# Patient Record
Sex: Female | Born: 1980 | Race: Black or African American | Hispanic: No | Marital: Married | Smoking: Former smoker
Health system: Southern US, Community
[De-identification: ages and names within clinical notes are randomized; demographics above are authoritative.]

## PROBLEM LIST (undated history)

## (undated) DIAGNOSIS — R51 Headache: Secondary | ICD-10-CM

## (undated) DIAGNOSIS — K219 Gastro-esophageal reflux disease without esophagitis: Secondary | ICD-10-CM

## (undated) DIAGNOSIS — F4310 Post-traumatic stress disorder, unspecified: Secondary | ICD-10-CM

## (undated) DIAGNOSIS — F419 Anxiety disorder, unspecified: Secondary | ICD-10-CM

## (undated) DIAGNOSIS — F0781 Postconcussional syndrome: Secondary | ICD-10-CM

## (undated) DIAGNOSIS — J45909 Unspecified asthma, uncomplicated: Secondary | ICD-10-CM

## (undated) DIAGNOSIS — Z9109 Other allergy status, other than to drugs and biological substances: Secondary | ICD-10-CM

## (undated) DIAGNOSIS — Z794 Long term (current) use of insulin: Secondary | ICD-10-CM

## (undated) DIAGNOSIS — Z9889 Other specified postprocedural states: Secondary | ICD-10-CM

## (undated) DIAGNOSIS — J449 Chronic obstructive pulmonary disease, unspecified: Secondary | ICD-10-CM

## (undated) DIAGNOSIS — G8929 Other chronic pain: Secondary | ICD-10-CM

## (undated) DIAGNOSIS — R112 Nausea with vomiting, unspecified: Secondary | ICD-10-CM

## (undated) DIAGNOSIS — L732 Hidradenitis suppurativa: Secondary | ICD-10-CM

## (undated) DIAGNOSIS — IMO0001 Reserved for inherently not codable concepts without codable children: Secondary | ICD-10-CM

## (undated) DIAGNOSIS — H9190 Unspecified hearing loss, unspecified ear: Secondary | ICD-10-CM

## (undated) DIAGNOSIS — E119 Type 2 diabetes mellitus without complications: Secondary | ICD-10-CM

## (undated) DIAGNOSIS — G4730 Sleep apnea, unspecified: Secondary | ICD-10-CM

---

## 1999-12-11 HISTORY — PX: WISDOM TOOTH EXTRACTION: SHX21

## 2005-12-10 HISTORY — PX: ENDOMETRIAL ABLATION: SHX621

## 2006-12-10 HISTORY — PX: ABDOMINAL HYSTERECTOMY: SHX81

## 2011-04-10 HISTORY — PX: TOOTH EXTRACTION: SHX859

## 2011-07-28 ENCOUNTER — Emergency Department (HOSPITAL_COMMUNITY)
Admission: EM | Admit: 2011-07-28 | Discharge: 2011-07-28 | Disposition: A | Discharge status: Home / Self Care | Payer: 59 | Attending: Emergency Medicine | Admitting: Emergency Medicine

## 2011-07-28 ENCOUNTER — Emergency Department (HOSPITAL_COMMUNITY): Payer: 59

## 2011-07-28 DIAGNOSIS — R51 Headache: Secondary | ICD-10-CM | POA: Insufficient documentation

## 2011-07-28 DIAGNOSIS — IMO0001 Reserved for inherently not codable concepts without codable children: Secondary | ICD-10-CM | POA: Insufficient documentation

## 2011-07-28 DIAGNOSIS — B9789 Other viral agents as the cause of diseases classified elsewhere: Secondary | ICD-10-CM | POA: Insufficient documentation

## 2011-07-28 DIAGNOSIS — J45909 Unspecified asthma, uncomplicated: Secondary | ICD-10-CM | POA: Insufficient documentation

## 2011-07-28 DIAGNOSIS — R05 Cough: Secondary | ICD-10-CM | POA: Insufficient documentation

## 2011-07-28 DIAGNOSIS — R110 Nausea: Secondary | ICD-10-CM | POA: Insufficient documentation

## 2011-07-28 DIAGNOSIS — R059 Cough, unspecified: Secondary | ICD-10-CM | POA: Insufficient documentation

## 2012-05-16 ENCOUNTER — Emergency Department (HOSPITAL_COMMUNITY): Payer: Managed Care, Other (non HMO)

## 2012-05-16 ENCOUNTER — Encounter (HOSPITAL_COMMUNITY): Payer: Self-pay

## 2012-05-16 ENCOUNTER — Emergency Department (HOSPITAL_COMMUNITY)
Admission: EM | Admit: 2012-05-16 | Discharge: 2012-05-16 | Disposition: A | Discharge status: Home / Self Care | Payer: Managed Care, Other (non HMO) | Attending: Emergency Medicine | Admitting: Emergency Medicine

## 2012-05-16 DIAGNOSIS — J45909 Unspecified asthma, uncomplicated: Secondary | ICD-10-CM

## 2012-05-16 HISTORY — DX: Unspecified asthma, uncomplicated: J45.909

## 2012-05-16 LAB — COMPREHENSIVE METABOLIC PANEL
BUN: 10 mg/dL (ref 6–23)
CO2: 22 mEq/L (ref 19–32)
Calcium: 9.6 mg/dL (ref 8.4–10.5)
Creatinine, Ser: 0.83 mg/dL (ref 0.50–1.10)
GFR calc Af Amer: >90 mL/min (ref >90)
GFR calc non Af Amer: >90 mL/min (ref >90)
Glucose, Bld: 87 mg/dL (ref 70–99)
Total Protein: 8 g/dL (ref 6.0–8.3)

## 2012-05-16 LAB — DIFFERENTIAL
Eosinophils Absolute: 0.1 10*3/uL (ref 0.0–0.7)
Eosinophils Relative: 2 % (ref 0–5)
Lymphocytes Relative: 46 % (ref 12–46)
Lymphs Abs: 3.8 10*3/uL (ref 0.7–4.0)
Monocytes Absolute: 0.8 10*3/uL (ref 0.1–1.0)
Monocytes Relative: 9 % (ref 3–12)

## 2012-05-16 LAB — CBC
HCT: 39.7 % (ref 36.0–46.0)
Hemoglobin: 13.2 g/dL (ref 12.0–15.0)
MCH: 28.6 pg (ref 26.0–34.0)
MCV: 86.1 fL (ref 78.0–100.0)
Platelets: 241 10*3/uL (ref 150–400)
RBC: 4.61 MIL/uL (ref 3.87–5.11)

## 2012-05-16 LAB — POCT I-STAT TROPONIN I: Troponin i, poc: 0 ng/mL (ref 0.00–0.08)

## 2012-05-16 MED ORDER — ALBUTEROL SULFATE (5 MG/ML) 0.5% IN NEBU
2.5000 mg | INHALATION_SOLUTION | Freq: Once | RESPIRATORY_TRACT | Status: DC
  Filled 2012-05-16: qty 1

## 2012-05-16 MED ORDER — ALBUTEROL SULFATE (5 MG/ML) 0.5% IN NEBU: 2.5000 mg | INHALATION_SOLUTION | Freq: Once | RESPIRATORY_TRACT | Status: DC

## 2012-05-16 MED ORDER — ALBUTEROL SULFATE (2.5 MG/3ML) 0.083% IN NEBU: 2.5000 mg | INHALATION_SOLUTION | Freq: Four times a day (QID) | RESPIRATORY_TRACT | Status: DC | PRN

## 2012-05-16 MED ORDER — PREDNISONE 10 MG PO TABS: 20.0000 mg | ORAL_TABLET | Freq: Every day | ORAL | Status: DC

## 2012-05-16 MED ORDER — IPRATROPIUM BROMIDE 0.02 % IN SOLN: 1.0000 mg | Freq: Once | RESPIRATORY_TRACT | Status: DC

## 2012-05-16 MED ORDER — ALBUTEROL SULFATE (5 MG/ML) 0.5% IN NEBU
5.0000 mg | INHALATION_SOLUTION | Freq: Once | RESPIRATORY_TRACT | Status: AC
  Administered 2012-05-16: 5 mg via RESPIRATORY_TRACT

## 2012-05-16 MED ORDER — PREDNISONE 20 MG PO TABS
60.0000 mg | ORAL_TABLET | Freq: Once | ORAL | Status: AC
  Administered 2012-05-16: 60 mg via ORAL
  Filled 2012-05-16: qty 3

## 2012-05-16 MED ORDER — AZITHROMYCIN 250 MG PO TABS: ORAL_TABLET | ORAL | Status: AC

## 2012-05-16 MED ORDER — ALBUTEROL SULFATE (5 MG/ML) 0.5% IN NEBU: 10.0000 mg | INHALATION_SOLUTION | Freq: Once | RESPIRATORY_TRACT | Status: DC

## 2012-05-16 MED ORDER — IPRATROPIUM BROMIDE 0.02 % IN SOLN
0.5000 mg | Freq: Once | RESPIRATORY_TRACT | Status: AC
  Administered 2012-05-16: 0.5 mg via RESPIRATORY_TRACT
  Filled 2012-05-16: qty 2.5

## 2012-05-16 MED ORDER — AZITHROMYCIN 250 MG PO TABS
500.0000 mg | ORAL_TABLET | Freq: Once | ORAL | Status: AC
  Administered 2012-05-16: 500 mg via ORAL
  Filled 2012-05-16: qty 2

## 2012-05-16 NOTE — ED Notes (Signed)
Pt reports headache, dizziness, and  pain in the middle of her back x3 days, intermittent chest tightness and non-productive cough x3 months, dx w/bronchitis, pt w/audible congestion triage, O2 sats 100% ra

## 2012-05-16 NOTE — ED Provider Notes (Signed)
History     CSN: [id number]  Arrival date & time 05/16/12  1718   First MD Initiated Contact with Patient 05/16/12 2013      Chief Complaint  Patient presents with  . Shortness of Breath  . Chest Pain    HPI  History provided by the patient. Patient is a 31 year old female with history of asthma chronic bronchitis presents with worsening asthma bronchitis symptoms for the past 3 days. Patient states she's had similar symptoms for a long period of time. Patient reports having increased dry nonproductive cough with increased wheezing. Patient also complains of generalized fatigue, lightheadedness and headache. Patient has been using her daily Advair, Zyrtec and increased albuterol when necessary without improvement. Patient states that her primary care provider as scheduled for her specialty appointments with allergists because they believe this could be contributing to her symptoms. Patient denies having any fever, nausea or vomiting.    Past Medical History  Diagnosis Date  . Bronchitis   . Asthma     Past Surgical History  Procedure Date  . Abdominal hysterectomy     History reviewed. No pertinent family history.  History  Substance Use Topics  . Smoking status: Current Some Day Smoker  . Smokeless tobacco: Not on file  . Alcohol Use: Yes     occasional    OB History    Grav Para Term Preterm Abortions TAB SAB Ect Mult Living                  Review of Systems  Constitutional: Negative for fever and chills.  HENT: Positive for congestion and rhinorrhea.   Respiratory: Positive for cough, shortness of breath and wheezing.   Gastrointestinal: Negative for nausea and vomiting.  Neurological: Positive for light-headedness and headaches.    Allergies  Kiwi extract and Strawberry  Home Medications   Current Outpatient Rx  Name Route Sig Dispense Refill  . ACETAMINOPHEN 500 MG PO TABS Oral Take 500 mg by mouth every 6 (six) hours as needed. For pain    .  ALBUTEROL SULFATE HFA 108 (90 BASE) MCG/ACT IN AERS Inhalation Inhale 4 puffs into the lungs every 6 (six) hours as needed. For shortness of breath    . CETIRIZINE HCL 10 MG PO TABS Oral Take 10 mg by mouth daily.    Marland Kitchen FLUTICASONE-SALMETEROL 250-50 MCG/DOSE IN AEPB Inhalation Inhale 2 puffs into the lungs every 12 (twelve) hours.    . ADULT MULTIVITAMIN W/MINERALS CH Oral Take 1 tablet by mouth daily.      BP 125/80  Pulse 103  Temp(Src) 97.4 F (36.3 C) (Oral)  Resp 24  SpO2 100%  Physical Exam  Nursing note and vitals reviewed. Constitutional: She is oriented to person, place, and time. She appears well-developed and well-nourished. No distress.  HENT:  Head: Normocephalic.  Cardiovascular: Normal rate and regular rhythm.   Pulmonary/Chest: Effort normal. No respiratory distress. She has wheezes. She has no rales. She exhibits no tenderness.  Abdominal: Soft.       Obese  Neurological: She is alert and oriented to person, place, and time.  Skin: Skin is warm and dry. No rash noted.  Psychiatric: She has a normal mood and affect. Her behavior is normal.    ED Course  Procedures   Results for orders placed during the hospital encounter of 05/16/12  CBC      Component Value Range   WBC 8.4  4.0 - 10.5 (K/uL)   RBC 4.61  3.87 -  5.11 (MIL/uL)   Hemoglobin 13.2  12.0 - 15.0 (g/dL)   HCT 40.9  81.1 - 91.4 (%)   MCV 86.1  78.0 - 100.0 (fL)   MCH 28.6  26.0 - 34.0 (pg)   MCHC 33.2  30.0 - 36.0 (g/dL)   RDW 78.2  95.6 - 21.3 (%)   Platelets 241  150 - 400 (K/uL)  DIFFERENTIAL      Component Value Range   Neutrophils Relative 44  43 - 77 (%)   Neutro Abs 3.7  1.7 - 7.7 (K/uL)   Lymphocytes Relative 46  12 - 46 (%)   Lymphs Abs 3.8  0.7 - 4.0 (K/uL)   Monocytes Relative 9  3 - 12 (%)   Monocytes Absolute 0.8  0.1 - 1.0 (K/uL)   Eosinophils Relative 2  0 - 5 (%)   Eosinophils Absolute 0.1  0.0 - 0.7 (K/uL)   Basophils Relative 0  0 - 1 (%)   Basophils Absolute 0.0  0.0 - 0.1  (K/uL)  COMPREHENSIVE METABOLIC PANEL      Component Value Range   Sodium 139  135 - 145 (mEq/L)   Potassium 3.7  3.5 - 5.1 (mEq/L)   Chloride 103  96 - 112 (mEq/L)   CO2 22  19 - 32 (mEq/L)   Glucose, Bld 87  70 - 99 (mg/dL)   BUN 10  6 - 23 (mg/dL)   Creatinine, Ser 0.86  0.50 - 1.10 (mg/dL)   Calcium 9.6  8.4 - 57.8 (mg/dL)   Total Protein 8.0  6.0 - 8.3 (g/dL)   Albumin 3.8  3.5 - 5.2 (g/dL)   AST 15  0 - 37 (U/L)   ALT 23  0 - 35 (U/L)   Alkaline Phosphatase 80  39 - 117 (U/L)   Total Bilirubin 0.3  0.3 - 1.2 (mg/dL)   GFR calc non Af Amer >90  >90 (mL/min)   GFR calc Af Amer >90  >90 (mL/min)  POCT I-STAT TROPONIN I      Component Value Range   Troponin i, poc 0.00  0.00 - 0.08 (ng/mL)   Comment 3               Dg Chest 2 View  05/16/2012  *RADIOLOGY REPORT*  Clinical Data: Chest pain, wheezing  CHEST - 2 VIEW  Comparison: 07/28/2011  Findings: Cardiomediastinal silhouette is stable.  No acute infiltrate or pleural effusion.  No pulmonary edema.  Bony thorax is stable.  IMPRESSION: No active disease.  No significant change.  Original Report Authenticated By: Natasha Mead, M.D.     1. Asthma with bronchitis       MDM  8:10 PM patient seen and evaluated. Patient no acute distress.  Discussed with patient option for additional breathing treatments. This time she does not wish to have any more treatments. Patient has normal oxygen saturations and respirations.  To plan to place patient on a Z-Pak. We'll also provide prescription for steroid use next few days. Patient will continue to follow up with PCP.     Date: 05/16/2012  Rate: 101  Rhythm: sinus tachycardia  QRS Axis: normal  Intervals: normal  ST/T Wave abnormalities: nonspecific T wave changes  Conduction Disutrbances:none  Narrative Interpretation:   Old EKG Reviewed: none available    Angus Seller, [place] 05/17/12 [phone]

## 2012-05-16 NOTE — Discharge Instructions (Signed)
You were seen and evaluated for your complaints of cough and wheezing. You were given breathing treatments in the emergency room and medication to help with your symptoms. Your providers have written prescriptions for you to continue a low dose steroids for the next 5 days as well as albuterol to use in your home nebulizer. Please continue your medication as prescribed by your doctor and followup this week for continued evaluation and treatment. Return to the emergency room for any worsening symptoms, increase shortness of breath, fever, chills or sweats.    Chronic Asthmatic Bronchitis Chronic asthmatic bronchitis is often a complication of frequent asthma and/or bronchitis. After a long enough period of time, the continual airflow blockage is present in spite of treatment for asthma. The medications that used to treat asthma no longer work. The symptoms of chronic bronchitis may also be present. Bronchitis is an inflammation of the breathing tubules in the lungs. The combination of asthma, chronic bronchitis, and emphysema all affect the small breathing tubules (bronchial tree) in our lungs. It is a common condition. The problems from each are similar and overlap with each other so are sometimes hard to diagnose. When the asthma and bronchitis are combined, there is usually inflammation and infection. The small bronchial tubes produce more mucus. This blocks the airways and makes breathing harder. Usually this process is caused more by external irritants than infection. Smokers with chronic bronchitis are at a greater risk to develop asthmatic bronchitis. CAUSES   Why some people with asthma go on to develop chronic asthmatic bronchitis is not known. Smoking and environmental toxins or allergens seem to play a role. There are wide differences in who is susceptible.   Abnormalities of the small airways may develop in persons with persistent asthma. Asthmatics can be uncommonly subject to the effects of  smoking. Asthma is also found associated with a number of other diseases.  SYMPTOMS  Asthma, chronic bronchitis, and emphysema all cause symptoms of cough, wheezing, shortness of breath, and recurring infections. There may also be chest discomfort. All of the above symptoms happen more often in chronic asthmatic bronchitis. DIAGNOSIS   Asthma, chronic bronchitis, and emphysema all affect the entire bronchial tree. This makes it difficult on exam to tell them apart. Other tests of the lungs are done to prove a diagnosis. These are called pulmonary function tests.  TREATMENT   The asthmatic condition itself must always be treated.   Infection can be treated with antibiotics (medications to kill germs).   Serious infections may require hospitalization. These can include pneumonia, sinus infections, and acute bronchitis.  HOME CARE INSTRUCTIONS  Use prescription medications as ordered by your caregiver.   Avoid pollen, dust, animal dander, molds, smoke, and other things that cause attacks at home and at work.   You may have fewer attacks if you decrease dust in your home. Electrostatic air cleaners may help.   It may help to replace your pillows or mattress with materials less likely to cause allergies.   If you are not on fluid restriction, drink 8 to 10 glasses of water each day.   Discuss possible exercise routines with your caregiver.   If animal dander is the cause of asthma, you may need to get rid of pets.   It is important that you:   Become educated about your medical condition.   Participate in maintaining wellness.   Seek medical care promptly or immediately as indicated below.   Delay in seeking medical attention could cause permanent  injury and may be a risk to your life.  SEEK MEDICAL CARE IF  You have wheezing and shortness of breath even if taking medicine to prevent attacks.   An oral temperature above 102 F (38.9 C)   You have muscle aches, chest pain, or  thickening of sputum.   Your sputum changes from clear or white to yellow, green, gray, or bloody.   You have any problems that may be related to the medicine you are taking (such as a rash, itching, swelling, or trouble breathing).  SEEK IMMEDIATE MEDICAL CARE IF:  Your usual medicines do not stop your wheezing.   There is increased coughing and/or shortness of breath.   You have increased difficulty breathing.  MAKE SURE YOU:   Understand these instructions.   Will watch your condition.   Will get help right away if you are not doing well or get worse.  Document Released: 09/13/2006 Document Revised: 11/15/2011 Document Reviewed: 11/11/2007 [facility] Patient Information 2012 [place], [place].   RESOURCE GUIDE  Chronic Pain Problems: Contact [facility]  [phone] Patients need to be referred by their primary care doctor.  Insufficient Money for Medicine: Contact United Way:  call "211" or [facility] ([phone].  No Primary Care Doctor: - Call [facility]  [phone] - can help you locate a primary care doctor that  accepts your insurance, provides certain services, etc. - Physician Referral Service- [phone]  Agencies that provide inexpensive medical care: - [facility]  [phone] - [facility]  [phone] - [facility]  [phone] [facility]  [phone] - Planned Parenthood  [phone] [facility]  [phone]  Medicaid-accepting [facility] Providers: - [facility]- [address]  [phone], Mon-Fri 9am-7pm, Sat 9am-1pm - [facility]- [address] [place], [address] [place]  [phone] - [facility]- [address] [place]  [phone] [facility]- [address]  [phone] - Renaye Rakers- [address] [place], [address], [phone]  Only accepts [place] patients after  they have their name  applied to their card  Self Pay (no insurance) in [employer]: - Sickle Cell Patients: Dr Willey Blade, [facility]  [address] [place], [phone] - [facility]- [address] [place]  [phone]       [facility] [place]- [address]       -     [facility]- see information above (Speak to [employer] if you do not have insurance)       -  [facility]- [address] [place], [phone]       -  [facility]- [address],  [phone]       -  [facility]- [address], [phone]       -  Dr Julio Sicks-  [address], [address], [place], [phone]       -  [facility]- [address] [phone]       -  [facility]- [address], [phone], also [address] [phone]       -    [facility]- [address] [place], [phone], 1st & 3rd Saturday   every month, 10am-1pm  1)  Find a Doctor and Pay Out of Pocket Although you won't have to find out who is covered by your insurance plan, it is a good idea to ask around and get recommendations. You will then need to call the office and see if the doctor you have chosen will accept you as a new patient and what types of options they offer for patients who are self-pay. Some doctors offer discounts or will set up payment plans for their patients who do not have insurance, but you will need to ask so you aren't surprised when you get to your appointment.  2) Contact Your Local Health Department Not all health departments have doctors that can see patients for sick visits, but many do, so it is worth a call to see if yours does. If you don't know where your local health department is, you can check in your phone book. The CDC also has a tool to help you locate your state's health department, and many state websites also have listings of all of their local health departments.  3) Find a  Walk-in Clinic If your illness is not likely to be very severe or complicated, you may want to try a walk in clinic. These are popping up all over the country in pharmacies, drugstores, and shopping centers. They're usually staffed by nurse practitioners or physician assistants that have been trained to treat common illnesses and complaints. They're usually fairly quick and inexpensive. However, if you have serious medical issues or chronic medical problems, these are probably not your best option  STD Testing - [facility] Department of [facility], STD Clinic, [address], [place], phone [phone] or [phone].  Monday - Friday, call for an appointment. [facility] Department of [employer], STD Clinic, [address], [place], phone [phone] or [phone].  Monday - Friday, call for an appointment.  Abuse/Neglect: [facility] Child Abuse Hotline 534-290-6455 [facility] Child Abuse Hotline 531-839-5636 (After Hours)  Emergency Shelter:  [facility] (832) 309-7993  Maternity Homes: - [facility] [place] [facility] 367-678-9261 - [facility] (570)699-1360  MRSA Hotline #:   930-604-3419  [facility] Resources  [facility] [place]  United Way [facility] Dept. [address]                 [address]         [address] [place] [address]  Blondell Reveal Phone:  [phone]                                  Phone:  [phone]                   Phone:  [phone]  [facility], [phone] - [address] - [phone]       -     [facility]  in [place], [address],                                  [phone], Insurance  [place] Child Abuse Hotline (515)437-8866 or (610) 453-4622 (After Hours)   Behavioral Health Services  Substance Abuse Resources: - [facility]  (619) 239-8749 - [facility] 838-405-0516 - [facility] [phone] Floydene Flock 3435667583 - Residential & Outpatient Substance Abuse Program  [phone]  Psychological Services: [facility]  ([phone] [facility] Services  [phone] - [facility], [phone] [place]. [address] [place], ACCESS LINE: 801-306-6473 or (636) [phone], [employer]za  Dental Assistance  If unable to pay or uninsured, contact:  [facility] or [facility]. to become qualified for the adult dental clinic.  Patients with Medicaid: [facility] [phone] [address], ([phone], [phone]  If unable to pay, or uninsured, contact [facility] (303) [phone]) or [facility] Department [phone] in [place], [phone] in [facility]) to become qualified for the adult dental clinic  Other Low-Cost Community Dental Services: - [facility]- [address] [place], [place], [place], [address], [phone], Ext. 123, 2nd and 4th Thursday of the month at 6:30am.  10 clients each day by appointment, can sometimes see walk-in patients if someone does not show for an appointment. [facility]- [address] Ether Griffins [place], [place], [address], [phone] - [facility]- [address] [place], [place], [address], [phone] - [facility]- [phone] [facility] Health Department- [phone] [facility] Department- [phone]

## 2012-05-17 NOTE — ED Provider Notes (Signed)
Medical screening examination/treatment/procedure(s) were performed by non-physician practitioner and as supervising physician I was immediately available for consultation/collaboration.   Carleene Cooper III, MD 05/17/12 [phone]

## 2012-05-20 ENCOUNTER — Ambulatory Visit (HOSPITAL_BASED_OUTPATIENT_CLINIC_OR_DEPARTMENT_OTHER): Payer: Managed Care, Other (non HMO) | Attending: Internal Medicine | Admitting: Radiology

## 2012-05-20 VITALS — Ht 65.0 in | Wt 263.0 lb

## 2012-05-20 DIAGNOSIS — R0609 Other forms of dyspnea: Secondary | ICD-10-CM | POA: Insufficient documentation

## 2012-05-20 DIAGNOSIS — Z9989 Dependence on other enabling machines and devices: Secondary | ICD-10-CM

## 2012-05-20 DIAGNOSIS — R0989 Other specified symptoms and signs involving the circulatory and respiratory systems: Secondary | ICD-10-CM | POA: Insufficient documentation

## 2012-05-20 DIAGNOSIS — G4733 Obstructive sleep apnea (adult) (pediatric): Secondary | ICD-10-CM | POA: Insufficient documentation

## 2012-05-24 DIAGNOSIS — R0989 Other specified symptoms and signs involving the circulatory and respiratory systems: Secondary | ICD-10-CM

## 2012-05-24 DIAGNOSIS — R0609 Other forms of dyspnea: Secondary | ICD-10-CM

## 2012-05-24 DIAGNOSIS — G4733 Obstructive sleep apnea (adult) (pediatric): Secondary | ICD-10-CM

## 2012-05-24 NOTE — Procedures (Signed)
NAME:  Sharon Gregory, Sharon Gregory              ACCOUNT NO.:  [id number]  MEDICAL RECORD NO.:  [id number]          PATIENT TYPE:  OUT  LOCATION:  SLEEP CENTER                 FACILITY[facility]  [facility]  PHYSICIAN:  Bijou Easler D. Maple Hudson, MD, FCCP, FACPDATE OF BIRTH:  1979-09-13  DATE OF STUDY:  05/20/2012                           NOCTURNAL POLYSOMNOGRAM  REFERRING PHYSICIAN:  Fleet Contras, M.D.  INDICATION FOR STUDY:  Hypersomnia with sleep apnea.  EPWORTH SLEEPINESS SCORE:  5/24.  BMI 43.8, weight 263 pounds, height 65 inches, neck 16 inches.  MEDICATIONS:  Home medications are charted and reviewed.  SLEEP ARCHITECTURE:  Split-study protocol.  During the diagnostic phase, total sleep time 124.5 minutes with sleep efficiency 80.6%.  Stage I was 4%, stage II 36.9%, stage III 44.6%, REM 14.5% of total sleep time. Sleep latency 16 minutes, REM latency 98 minutes, awake after sleep onset 9 minutes.  Arousal index 9.6.  BEDTIME MEDICATION:  None.  RESPIRATORY DATA:  Apnea-hypopnea index (AHI) 14.9 per hour.  A total of 31 events was scored including 6 obstructive apneas, 25 hypopneas.  The events were not positional.  REM/AHI 73.3 per hour.  CPAP was titrated to 16 CWP, AHI 0 per hour.  She wore a medium ResMed Mirage Quattro full- face mask with heated humidifier and a C-flex of 3.  OXYGEN DATA:  Moderately loud snoring before CPAP with oxygen desaturation to a nadir of 83% on room air.  With CPAP titration, snoring was prevented and mean oxygen saturation held 97.1% on room air.  CARDIAC DATA:  Normal sinus rhythm.  MOVEMENT-PARASOMNIA:  No significant movement disturbance.  Bathroom x1.  IMPRESSIONS-RECOMMENDATIONS: 1. Mild obstructive sleep apnea/hypopnea syndrome, AHI 14.9 per hour     with non positional events.  Moderately loud snoring with oxygen     desaturation to a nadir of 83% on room air. 2. Successful CPAP titration to 16 CWP, AHI 0 per hour.  She wore a     medium ResMed Mirage Quattro  full-face     mask with heated humidifier and a C-flex setting of 3.  Snoring was     prevented and mean oxygen saturation held 97.1% on room air with     CPAP.     Micheala Morissette D. Maple Hudson, MD, [facility], FACP Diplomate, American Board of Sleep Medicine    CDY/MEDQ  D:  05/24/2012 10:34:08  T:  05/24/2012 19:05:11  Job:  [id number]

## 2012-12-10 HISTORY — PX: TOE SURGERY: SHX1073

## 2013-02-13 ENCOUNTER — Encounter (HOSPITAL_COMMUNITY): Payer: Self-pay

## 2013-02-13 ENCOUNTER — Emergency Department (INDEPENDENT_AMBULATORY_CARE_PROVIDER_SITE_OTHER)
Admission: EM | Admit: 2013-02-13 | Discharge: 2013-02-13 | Disposition: A | Discharge status: Home / Self Care | Payer: Managed Care, Other (non HMO) | Source: Home / Self Care | Attending: Emergency Medicine | Admitting: Emergency Medicine

## 2013-02-13 DIAGNOSIS — L304 Erythema intertrigo: Secondary | ICD-10-CM

## 2013-02-13 DIAGNOSIS — L538 Other specified erythematous conditions: Secondary | ICD-10-CM

## 2013-02-13 MED ORDER — NYSTATIN 100000 UNIT/GM EX POWD: Freq: Two times a day (BID) | CUTANEOUS | Status: DC

## 2013-02-13 MED ORDER — NYSTATIN-TRIAMCINOLONE 100000-0.1 UNIT/GM-% EX OINT: TOPICAL_OINTMENT | Freq: Two times a day (BID) | CUTANEOUS | Status: DC

## 2013-02-13 MED ORDER — CEPHALEXIN 500 MG PO CAPS: 500.0000 mg | ORAL_CAPSULE | Freq: Four times a day (QID) | ORAL | Status: DC

## 2013-02-13 NOTE — ED Notes (Signed)
Rash under bust

## 2013-02-13 NOTE — ED Provider Notes (Signed)
Medical screening examination/treatment/procedure(s) were performed by non-physician practitioner and as supervising physician I was immediately available for consultation/collaboration.  David Keller, M.D.  David C Keller, MD 02/13/13 2017 

## 2013-02-13 NOTE — ED Provider Notes (Signed)
History     CSN: [id number]  Arrival date & time 02/13/13  1552   First MD Initiated Contact with Patient 02/13/13 1658      Chief Complaint  Patient presents with  . Rash    (Consider location/radiation/quality/duration/timing/severity/associated sxs/prior treatment) Patient is a 32 y.o. female presenting with rash. The history is provided by the patient.  Rash Location:  Torso Torso rash location: under B breasts. Quality: itchiness   Quality comment:  Flat patches and raised bumps Severity:  Moderate Onset quality:  Gradual Duration:  1 week Timing:  Constant Progression:  Unchanged Chronicity:  New Context comment:  Denies any new exposures Relieved by:  Nothing Ineffective treatments:  Antihistamines and anti-fungal cream Associated symptoms: no fever     Past Medical History  Diagnosis Date  . Bronchitis   . Asthma     Past Surgical History  Procedure Laterality Date  . Abdominal hysterectomy      History reviewed. No pertinent family history.  History  Substance Use Topics  . Smoking status: Current Some Day Smoker  . Smokeless tobacco: Not on file  . Alcohol Use: Yes     Comment: occasional    OB History   Grav Para Term Preterm Abortions TAB SAB Ect Mult Living                  Review of Systems  Constitutional: Negative for fever and chills.  Skin: Positive for rash.    Allergies  Kiwi extract and Strawberry  Home Medications   Current Outpatient Rx  Name  Route  Sig  Dispense  Refill  . acetaminophen (TYLENOL) 500 MG tablet   Oral   Take 500 mg by mouth every 6 (six) hours as needed. For pain         . albuterol (PROVENTIL HFA;VENTOLIN HFA) 108 (90 BASE) MCG/ACT inhaler   Inhalation   Inhale 4 puffs into the lungs every 6 (six) hours as needed. For shortness of breath         . albuterol (PROVENTIL) (2.5 MG/3ML) 0.083% nebulizer solution   Nebulization   Take 3 mLs (2.5 mg total) by nebulization every 6 (six) hours as  needed for wheezing.   75 mL   12   . cephALEXin (KEFLEX) 500 MG capsule   Oral   Take 1 capsule (500 mg total) by mouth 4 (four) times daily.   28 capsule   0   . cetirizine (ZYRTEC) 10 MG tablet   Oral   Take 10 mg by mouth daily.         . Fluticasone-Salmeterol (ADVAIR) 250-50 MCG/DOSE AEPB   Inhalation   Inhale 2 puffs into the lungs every 12 (twelve) hours.         . Multiple Vitamin (MULTIVITAMIN WITH MINERALS) TABS   Oral   Take 1 tablet by mouth daily.         Marland Kitchen nystatin (MYCOSTATIN) powder   Topical   Apply topically 2 (two) times daily.   15 g   0   . nystatin-triamcinolone ointment (MYCOLOG)   Topical   Apply topically 2 (two) times daily.   30 g   0   . predniSONE (DELTASONE) 10 MG tablet   Oral   Take 2 tablets (20 mg total) by mouth daily.   10 tablet   0     BP 122/87  Pulse 109  Temp(Src) 98.9 F (37.2 C) (Oral)  Resp 21  SpO2 96%  Physical  Exam  Constitutional: She appears well-developed and well-nourished. No distress.  obese  Skin: Skin is warm, dry and intact. Rash noted. Rash is papular. No erythema.  Pt obese, breast pendulous.  Under B breasts and on chest between breasts is a rash.  Rash is patchy in places with shiny skin c/w candida and papular in places.  Excoriations from itching present.     ED Course  Procedures (including critical care time)  Labs Reviewed - No data to display No results found.   1. Intertrigo       MDM  Discussed with Dr. Lorenz Coaster. Papules may be related to secondary infection of candida rash.  Will tx with keflex and mycolog cream. Also rx nystatin powder to use under breasts when rash resolved.         Cathlyn Parsons, NP 02/13/13 1708

## 2013-04-06 ENCOUNTER — Emergency Department (HOSPITAL_COMMUNITY): Payer: Managed Care, Other (non HMO)

## 2013-04-06 ENCOUNTER — Encounter (HOSPITAL_COMMUNITY): Payer: Self-pay | Admitting: Adult Health

## 2013-04-06 ENCOUNTER — Emergency Department (HOSPITAL_COMMUNITY)
Admission: EM | Admit: 2013-04-06 | Discharge: 2013-04-06 | Disposition: A | Discharge status: Home / Self Care | Payer: Managed Care, Other (non HMO) | Attending: Emergency Medicine | Admitting: Emergency Medicine

## 2013-04-06 DIAGNOSIS — J3489 Other specified disorders of nose and nasal sinuses: Secondary | ICD-10-CM | POA: Insufficient documentation

## 2013-04-06 DIAGNOSIS — Z8709 Personal history of other diseases of the respiratory system: Secondary | ICD-10-CM | POA: Insufficient documentation

## 2013-04-06 DIAGNOSIS — R110 Nausea: Secondary | ICD-10-CM | POA: Insufficient documentation

## 2013-04-06 DIAGNOSIS — R221 Localized swelling, mass and lump, neck: Secondary | ICD-10-CM | POA: Insufficient documentation

## 2013-04-06 DIAGNOSIS — H53149 Visual discomfort, unspecified: Secondary | ICD-10-CM | POA: Insufficient documentation

## 2013-04-06 DIAGNOSIS — J45909 Unspecified asthma, uncomplicated: Secondary | ICD-10-CM | POA: Insufficient documentation

## 2013-04-06 DIAGNOSIS — H5710 Ocular pain, unspecified eye: Secondary | ICD-10-CM | POA: Insufficient documentation

## 2013-04-06 DIAGNOSIS — Z79899 Other long term (current) drug therapy: Secondary | ICD-10-CM | POA: Insufficient documentation

## 2013-04-06 DIAGNOSIS — R220 Localized swelling, mass and lump, head: Secondary | ICD-10-CM | POA: Insufficient documentation

## 2013-04-06 DIAGNOSIS — H5711 Ocular pain, right eye: Secondary | ICD-10-CM

## 2013-04-06 DIAGNOSIS — F17200 Nicotine dependence, unspecified, uncomplicated: Secondary | ICD-10-CM | POA: Insufficient documentation

## 2013-04-06 LAB — CBC WITH DIFFERENTIAL/PLATELET
Basophils Relative: 0 % (ref 0–1)
Eosinophils Absolute: 0.2 10*3/uL (ref 0.0–0.7)
Hemoglobin: 11.8 g/dL — ABNORMAL LOW (ref 12.0–15.0)
MCH: 28 pg (ref 26.0–34.0)
MCHC: 33.4 g/dL (ref 30.0–36.0)
Monocytes Absolute: 0.7 10*3/uL (ref 0.1–1.0)
Monocytes Relative: 10 % (ref 3–12)
Neutrophils Relative %: 38 % — ABNORMAL LOW (ref 43–77)

## 2013-04-06 LAB — POCT I-STAT, CHEM 8
Calcium, Ion: 1.23 mmol/L (ref 1.12–1.23)
Glucose, Bld: 153 mg/dL — ABNORMAL HIGH (ref 70–99)
HCT: 36 % (ref 36.0–46.0)
Hemoglobin: 12.2 g/dL (ref 12.0–15.0)
Potassium: 3.8 mEq/L (ref 3.5–5.1)
TCO2: 28 mmol/L (ref 0–100)

## 2013-04-06 MED ORDER — ONDANSETRON 4 MG PO TBDP
8.0000 mg | ORAL_TABLET | Freq: Once | ORAL | Status: AC
  Administered 2013-04-06: 8 mg via ORAL
  Filled 2013-04-06: qty 2

## 2013-04-06 MED ORDER — IOHEXOL 300 MG/ML  SOLN
80.0000 mL | Freq: Once | INTRAMUSCULAR | Status: AC | PRN
  Administered 2013-04-06: 80 mL via INTRAVENOUS

## 2013-04-06 MED ORDER — SODIUM CHLORIDE 0.9 % IV SOLN
Freq: Once | INTRAVENOUS | Status: AC
  Administered 2013-04-06: 06:00:00 via INTRAVENOUS

## 2013-04-06 MED ORDER — TETRACAINE HCL 0.5 % OP SOLN
2.0000 [drp] | Freq: Once | OPHTHALMIC | Status: AC
  Administered 2013-04-06: 2 [drp] via OPHTHALMIC
  Filled 2013-04-06: qty 2

## 2013-04-06 MED ORDER — FLUORESCEIN SODIUM 1 MG OP STRP
ORAL_STRIP | OPHTHALMIC | Status: AC
  Administered 2013-04-06: 06:00:00
  Filled 2013-04-06: qty 1

## 2013-04-06 NOTE — ED Provider Notes (Signed)
History     CSN: [id number]  Arrival date & time 04/06/13  0029   First MD Initiated Contact with Patient 04/06/13 0421      Chief Complaint  Patient presents with  . Eye Pain    (Consider location/radiation/quality/duration/timing/severity/associated sxs/prior treatment) HPI Comments: Patient presents tonight with 3, weeks of right eye pain, nausea.  She states last week she had some facial swelling, and pain with movement of the eye.  This has been better.  She also states, that she's noticed some change in the vision of her right eye.  She does wear glasses.  Her last eye exam was August.  No prescription changes made.   She has had regular screening for glaucoma as she has a family history and no mention was made of increased pressures  Patient is a 32 y.o. female presenting with eye pain. The history is provided by the patient.  Eye Pain This is a new problem. The current episode started 1 to 4 weeks ago. The problem occurs constantly. The problem has been gradually worsening. Associated symptoms include congestion, nausea and a visual change. Pertinent negatives include no fever or neck pain. The symptoms are aggravated by exertion. She has tried acetaminophen for the symptoms. The treatment provided no relief.    Past Medical History  Diagnosis Date  . Bronchitis   . Asthma     Past Surgical History  Procedure Laterality Date  . Abdominal hysterectomy      History reviewed. No pertinent family history.  History  Substance Use Topics  . Smoking status: Current Some Day Smoker  . Smokeless tobacco: Not on file  . Alcohol Use: Yes     Comment: occasional    OB History   Grav Para Term Preterm Abortions TAB SAB Ect Mult Living                  Review of Systems  Constitutional: Negative for fever.  HENT: Positive for congestion. Negative for neck pain.   Eyes: Positive for photophobia, pain and visual disturbance. Negative for discharge and redness.   Gastrointestinal: Positive for nausea.  All other systems reviewed and are negative.    Allergies  Kiwi extract and Strawberry  Home Medications   Current Outpatient Rx  Name  Route  Sig  Dispense  Refill  . albuterol (PROVENTIL HFA;VENTOLIN HFA) 108 (90 BASE) MCG/ACT inhaler   Inhalation   Inhale 4 puffs into the lungs every 6 (six) hours as needed. For shortness of breath         . Beclomethasone Dipropionate (QNASL) 80 MCG/ACT AERS   Nasal   Place 1 spray into the nose daily.         . cetirizine (ZYRTEC) 10 MG tablet   Oral   Take 10 mg by mouth daily.         Marland Kitchen ibuprofen (ADVIL,MOTRIN) 800 MG tablet   Oral   Take 800 mg by mouth every 8 (eight) hours as needed for pain.         . Multiple Vitamin (MULTIVITAMIN WITH MINERALS) TABS   Oral   Take 1 tablet by mouth daily.         Marland Kitchen nystatin (MYCOSTATIN) powder   Topical   Apply topically 2 (two) times daily.   15 g   0   . nystatin-triamcinolone ointment (MYCOLOG)   Topical   Apply topically 2 (two) times daily.   30 g   0   . Olopatadine HCl (  PATANASE) 0.6 % SOLN   Nasal   Place 1 puff into the nose 2 (two) times daily.         Marland Kitchen omeprazole (PRILOSEC) 40 MG capsule   Oral   Take 40 mg by mouth every other day.           BP 154/103  Pulse 90  Temp(Src) 97.7 F (36.5 C) (Oral)  Resp 18  SpO2 98%  Physical Exam  Nursing note and vitals reviewed. Constitutional: She appears well-developed and well-nourished.  HENT:  Head: Normocephalic.  Eyes: Right eye exhibits no discharge. Left eye exhibits no discharge. Right conjunctiva is not injected. Right conjunctiva has a hemorrhage. Left conjunctiva is not injected. Left conjunctiva has no hemorrhage. Right eye exhibits normal extraocular motion and no nystagmus. Left eye exhibits no nystagmus. Pupils are equal.  Fundoscopic exam:      The right eye shows no exudate, no hemorrhage and no papilledema.  Slit lamp exam:      The right eye  shows no fluorescein uptake.  Right eye pressure 8    ED Course  Procedures (including critical care time)  Labs Reviewed  CBC WITH DIFFERENTIAL - Abnormal; Notable for the following:    Hemoglobin 11.8 (*)    HCT 35.3 (*)    Neutrophils Relative 38 (*)    Lymphocytes Relative 49 (*)    All other components within normal limits   No results found.   No diagnosis found.    MDM           Arman Filter, NP 04/06/13 [phone]

## 2013-04-06 NOTE — ED Provider Notes (Signed)
Pt received from Earley Favor PA-C at shift change.  Pt complaining of right eye pain x 3 weeks, worse with touching or moving the eye.  Denise any blurred vision, flashes, or floaters.  Pt has regular eye exams- wears glasses and has no personal hx of glaucoma. Right eye pressure normal at 8.  Plan:  CT orbit- d/c when appropriate.  Results for orders placed during the hospital encounter of 04/06/13  CBC WITH DIFFERENTIAL      Result Value Range   WBC 7.2  4.0 - 10.5 K/uL   RBC 4.22  3.87 - 5.11 MIL/uL   Hemoglobin 11.8 (*) 12.0 - 15.0 g/dL   HCT 45.4 (*) 09.8 - 11.9 %   MCV 83.6  78.0 - 100.0 fL   MCH 28.0  26.0 - 34.0 pg   MCHC 33.4  30.0 - 36.0 g/dL   RDW 14.7  82.9 - 56.2 %   Platelets 250  150 - 400 K/uL   Neutrophils Relative 38 (*) 43 - 77 %   Neutro Abs 2.7  1.7 - 7.7 K/uL   Lymphocytes Relative 49 (*) 12 - 46 %   Lymphs Abs 3.5  0.7 - 4.0 K/uL   Monocytes Relative 10  3 - 12 %   Monocytes Absolute 0.7  0.1 - 1.0 K/uL   Eosinophils Relative 3  0 - 5 %   Eosinophils Absolute 0.2  0.0 - 0.7 K/uL   Basophils Relative 0  0 - 1 %   Basophils Absolute 0.0  0.0 - 0.1 K/uL   Ct Orbits W/cm  04/06/2013  *RADIOLOGY REPORT*  Clinical Data: Right eye pain beginning 3 weeks ago.  Nausea and right facial swelling.  CT ORBITS WITH CONTRAST  Technique:  Multidetector CT imaging of the orbits was performed following the bolus administration of intravenous contrast.  Contrast: 80mL OMNIPAQUE IOHEXOL 300 MG/ML  SOLN  Comparison: None.  Findings: The globes and extraocular muscles appear intact and symmetrical.  No evidence of significant periorbital or retrobulbar infiltration.  No abnormal contrast enhancement.  There is some opacification and mucous membrane thickening in the ethmoid air cells and frontal sinuses.  No acute air-fluid levels are demonstrated in the paranasal sinuses.  The orbital and facial bones appear intact.  IMPRESSION: No acute process demonstrated in the right orbit.   Consider MRI for additional evaluation if symptoms persist.   Original Report Authenticated By: Burman Nieves, M.D.     CT orbit negative.  Visual acuity screening uncorrected at L 20/30, R 20/100, bilat 20/40- pt did not bring her glasses with her.  Unsure if this visual acuity differs from baseline.  Some relief of pain with tetracaine.  Pt will FU with her eye doctor for further evaluation and possible MRI.  Discussed with Dr. Rulon Abide who agrees with plan.  Return precautions advised.  Garlon Hatchet, PA-C 04/07/13 1220  Garlon Hatchet, PA-C 04/07/13 1221

## 2013-04-06 NOTE — ED Notes (Signed)
Presents with right eye pain that began 3 weeks associated with nausea and right facial swelling that has gone down. Opening eye and touch makes pain worse. Pt denies blurred vision. She states, "this is not a headache or migraine. This is my actual eyeball and the pain is making me nauseated"  Pain is 10/10 described as throbbing. Denies flashing lights and floaters.

## 2013-04-07 NOTE — ED Provider Notes (Signed)
Medical screening examination/treatment/procedure(s) were performed by non-physician practitioner and as supervising physician I was immediately available for consultation/collaboration.  Jones Skene, M.D.     Jones Skene, MD 04/07/13 3244

## 2013-04-10 NOTE — ED Provider Notes (Signed)
Medical screening examination/treatment/procedure(s) were performed by non-physician practitioner and as supervising physician I was immediately available for consultation/collaboration.  John-Adam Raneen Jaffer, M.D.  John-Adam Travone Georg, MD 04/10/13 0825 

## 2013-05-19 ENCOUNTER — Encounter (HOSPITAL_COMMUNITY): Payer: Self-pay | Admitting: Adult Health

## 2013-05-19 ENCOUNTER — Emergency Department (HOSPITAL_COMMUNITY): Payer: Managed Care, Other (non HMO)

## 2013-05-19 ENCOUNTER — Emergency Department (HOSPITAL_COMMUNITY)
Admission: EM | Admit: 2013-05-19 | Discharge: 2013-05-19 | Disposition: A | Discharge status: Home / Self Care | Payer: Managed Care, Other (non HMO) | Attending: Emergency Medicine | Admitting: Emergency Medicine

## 2013-05-19 DIAGNOSIS — Y939 Activity, unspecified: Secondary | ICD-10-CM | POA: Insufficient documentation

## 2013-05-19 DIAGNOSIS — M79674 Pain in right toe(s): Secondary | ICD-10-CM

## 2013-05-19 DIAGNOSIS — Z79899 Other long term (current) drug therapy: Secondary | ICD-10-CM | POA: Insufficient documentation

## 2013-05-19 DIAGNOSIS — J45909 Unspecified asthma, uncomplicated: Secondary | ICD-10-CM | POA: Insufficient documentation

## 2013-05-19 DIAGNOSIS — F17200 Nicotine dependence, unspecified, uncomplicated: Secondary | ICD-10-CM | POA: Insufficient documentation

## 2013-05-19 DIAGNOSIS — Z8709 Personal history of other diseases of the respiratory system: Secondary | ICD-10-CM | POA: Insufficient documentation

## 2013-05-19 DIAGNOSIS — S8990XA Unspecified injury of unspecified lower leg, initial encounter: Secondary | ICD-10-CM | POA: Insufficient documentation

## 2013-05-19 DIAGNOSIS — Y929 Unspecified place or not applicable: Secondary | ICD-10-CM | POA: Insufficient documentation

## 2013-05-19 DIAGNOSIS — X500XXA Overexertion from strenuous movement or load, initial encounter: Secondary | ICD-10-CM | POA: Insufficient documentation

## 2013-05-19 MED ORDER — IBUPROFEN 200 MG PO TABS
400.0000 mg | ORAL_TABLET | Freq: Once | ORAL | Status: AC
  Administered 2013-05-19: 400 mg via ORAL
  Filled 2013-05-19: qty 2

## 2013-05-19 MED ORDER — IBUPROFEN 800 MG PO TABS: 800.0000 mg | ORAL_TABLET | Freq: Three times a day (TID) | ORAL | Status: DC

## 2013-05-19 MED ORDER — OXYCODONE-ACETAMINOPHEN 5-325 MG PO TABS
1.0000 | ORAL_TABLET | Freq: Once | ORAL | Status: AC
  Administered 2013-05-19: 1 via ORAL
  Filled 2013-05-19: qty 1

## 2013-05-19 MED ORDER — ONDANSETRON 4 MG PO TBDP
8.0000 mg | ORAL_TABLET | Freq: Once | ORAL | Status: AC
  Administered 2013-05-19: 8 mg via ORAL
  Filled 2013-05-19: qty 2

## 2013-05-19 NOTE — ED Notes (Signed)
Presents with right great toe and 2nd toe injury. Pt states "It bent backwards against the steps. I can not move my toe" nail bleeding and swelling noted.

## 2013-05-19 NOTE — ED Provider Notes (Signed)
Medical screening examination/treatment/procedure(s) were performed by non-physician practitioner and as supervising physician I was immediately available for consultation/collaboration.   Gwyneth Sprout, MD 05/19/13 [phone]

## 2013-05-19 NOTE — ED Provider Notes (Signed)
History     CSN: [id number]  Arrival date & time 05/19/13  2025   First MD Initiated Contact with Patient 05/19/13 2200      Chief Complaint  Patient presents with  . Toe Injury    (Consider location/radiation/quality/duration/timing/severity/associated sxs/prior treatment) HPI Comments: Patient presents with chief complaint of toe pain. She states that she stubbed her toe and bent it backwards against the steps. She reports increasing pain in her toe since the accident. She also endorses some swelling. She states that the pain keeps her from wanting to put pressure on her toes. She has not tried anything to alleviate her symptoms.  The history is provided by the patient. No language interpreter was used.    Past Medical History  Diagnosis Date  . Bronchitis   . Asthma     Past Surgical History  Procedure Laterality Date  . Abdominal hysterectomy      History reviewed. No pertinent family history.  History  Substance Use Topics  . Smoking status: Current Some Day Smoker  . Smokeless tobacco: Not on file  . Alcohol Use: Yes     Comment: occasional    OB History   Grav Para Term Preterm Abortions TAB SAB Ect Mult Living                  Review of Systems  All other systems reviewed and are negative.    Allergies  Kiwi extract and Strawberry  Home Medications   Current Outpatient Rx  Name  Route  Sig  Dispense  Refill  . albuterol (PROVENTIL HFA;VENTOLIN HFA) 108 (90 BASE) MCG/ACT inhaler   Inhalation   Inhale 4 puffs into the lungs every 6 (six) hours as needed. For shortness of breath         . Beclomethasone Dipropionate (QNASL) 80 MCG/ACT AERS   Nasal   Place 1 spray into the nose daily.         . cetirizine (ZYRTEC) 10 MG tablet   Oral   Take 10 mg by mouth daily.         Marland Kitchen esomeprazole (NEXIUM) 40 MG capsule   Oral   Take 40 mg by mouth every other day.         . ibuprofen (ADVIL,MOTRIN) 800 MG tablet   Oral   Take 800 mg by  mouth every 8 (eight) hours as needed for pain.         . Multiple Vitamin (MULTIVITAMIN WITH MINERALS) TABS   Oral   Take 1 tablet by mouth daily.         . Olopatadine HCl (PATANASE) 0.6 % SOLN   Nasal   Place 1 puff into the nose 2 (two) times daily.         Marland Kitchen omeprazole (PRILOSEC) 40 MG capsule   Oral   Take 40 mg by mouth every other day.         . ibuprofen (ADVIL,MOTRIN) 800 MG tablet   Oral   Take 1 tablet (800 mg total) by mouth 3 (three) times daily.   21 tablet   0     BP 129/86  Pulse 96  Temp(Src) 98.5 F (36.9 C) (Oral)  Resp 14  SpO2 98%  Physical Exam  Nursing note and vitals reviewed. Constitutional: She is oriented to person, place, and time. She appears well-developed and well-nourished.  HENT:  Head: Normocephalic and atraumatic.  Eyes: Conjunctivae and EOM are normal.  Neck: Normal range of motion.  Cardiovascular: Normal rate and intact distal pulses.   Brisk cap refill  Pulmonary/Chest: Effort normal.  Abdominal: She exhibits no distension.  Musculoskeletal: Normal range of motion.  Right great toe tender to palpation, range of motion and strength deferred secondary to pain, moderate swelling  Neurological: She is alert and oriented to person, place, and time.  Skin: Skin is dry.  Right great toenail with mild avulsion, however it is not completely of avulsed, mild bleeding which is controlled.  Psychiatric: She has a normal mood and affect. Her behavior is normal. Judgment and thought content normal.    ED Course  Procedures (including critical care time)  Labs Reviewed - No data to display Dg Foot Complete Right  05/19/2013   *RADIOLOGY REPORT*  Clinical Data: Blunt trauma, striking the right foot on stairs today.  Swelling of the toes.  Bleeding and the nail of the great toe.  Limited range of motion.  RIGHT FOOT COMPLETE - 3+ VIEW  Comparison: None.  Findings: Dorsal and plantar soft tissue swelling is demonstrated in the  forefoot.  Underlying bones appear intact.  No evidence of acute fracture or subluxation.  No focal bone lesion or bone destruction.  Bone cortex and trabecular architecture appear intact.  No radiopaque soft tissue foreign bodies.  Plantar spur on the calcaneus.  IMPRESSION: Soft tissue swelling.  No acute bony abnormalities identified.   Original Report Authenticated By: Burman Nieves, M.D.     1. Toe pain, right       MDM  Patient with right great toe nail avulsion, and toe pain. Patient stubbed her toe earlier today. Will treat with ibuprofen, and give postop shoe, and a crutch. Patient is stable and ready for discharge. No fractures.        Roxy Horseman, PA-C 05/19/13 2238

## 2013-06-28 ENCOUNTER — Encounter (HOSPITAL_COMMUNITY): Payer: Self-pay | Admitting: Emergency Medicine

## 2013-06-28 ENCOUNTER — Inpatient Hospital Stay (HOSPITAL_COMMUNITY)
Admission: EM | Admit: 2013-06-28 | Discharge: 2013-06-30 | DRG: 638 | Disposition: A | Discharge status: Home / Self Care | Payer: Managed Care, Other (non HMO) | Attending: Internal Medicine | Admitting: Internal Medicine

## 2013-06-28 DIAGNOSIS — J4489 Other specified chronic obstructive pulmonary disease: Secondary | ICD-10-CM | POA: Diagnosis present

## 2013-06-28 DIAGNOSIS — E669 Obesity, unspecified: Secondary | ICD-10-CM | POA: Diagnosis present

## 2013-06-28 DIAGNOSIS — E119 Type 2 diabetes mellitus without complications: Secondary | ICD-10-CM

## 2013-06-28 DIAGNOSIS — Z6841 Body Mass Index (BMI) 40.0 and over, adult: Secondary | ICD-10-CM

## 2013-06-28 DIAGNOSIS — Z87891 Personal history of nicotine dependence: Secondary | ICD-10-CM

## 2013-06-28 DIAGNOSIS — E860 Dehydration: Secondary | ICD-10-CM | POA: Diagnosis present

## 2013-06-28 DIAGNOSIS — J449 Chronic obstructive pulmonary disease, unspecified: Secondary | ICD-10-CM

## 2013-06-28 DIAGNOSIS — R39859 Costovertebral (angle) tenderness, unspecified side: Secondary | ICD-10-CM

## 2013-06-28 DIAGNOSIS — R739 Hyperglycemia, unspecified: Secondary | ICD-10-CM

## 2013-06-28 DIAGNOSIS — E1110 Type 2 diabetes mellitus with ketoacidosis without coma: Secondary | ICD-10-CM

## 2013-06-28 DIAGNOSIS — M549 Dorsalgia, unspecified: Secondary | ICD-10-CM

## 2013-06-28 DIAGNOSIS — E1010 Type 1 diabetes mellitus with ketoacidosis without coma: Principal | ICD-10-CM | POA: Diagnosis present

## 2013-06-28 DIAGNOSIS — Z79899 Other long term (current) drug therapy: Secondary | ICD-10-CM

## 2013-06-28 HISTORY — DX: Gastro-esophageal reflux disease without esophagitis: K21.9

## 2013-06-28 HISTORY — DX: Chronic obstructive pulmonary disease, unspecified: J44.9

## 2013-06-28 HISTORY — DX: Headache: R51

## 2013-06-28 HISTORY — DX: Sleep apnea, unspecified: G47.30

## 2013-06-28 LAB — URINALYSIS, ROUTINE W REFLEX MICROSCOPIC
Bilirubin Urine: NEGATIVE
Glucose, UA: >1000 mg/dL — AB
Ketones, ur: >80 mg/dL — AB
Leukocytes, UA: NEGATIVE
Specific Gravity, Urine: 1.01 (ref 1.005–1.030)
pH: 5.5 (ref 5.0–8.0)

## 2013-06-28 LAB — CBC
MCHC: 34.1 g/dL (ref 30.0–36.0)
Platelets: 237 10*3/uL (ref 150–400)
RDW: 12.9 % (ref 11.5–15.5)

## 2013-06-28 LAB — GLUCOSE, CAPILLARY

## 2013-06-28 LAB — POCT PREGNANCY, URINE: Preg Test, Ur: NEGATIVE

## 2013-06-28 LAB — URINE MICROSCOPIC-ADD ON

## 2013-06-28 MED ORDER — SODIUM CHLORIDE 0.9 % IV SOLN
1000.0000 mL | Freq: Once | INTRAVENOUS | Status: AC
  Administered 2013-06-29: 1000 mL via INTRAVENOUS

## 2013-06-28 MED ORDER — SODIUM CHLORIDE 0.9 % IV SOLN: 1000.0000 mL | INTRAVENOUS | Status: DC

## 2013-06-28 NOTE — ED Notes (Signed)
CBG Exceeds Measure. Notified Nurse Clydie Braun.

## 2013-06-28 NOTE — ED Notes (Signed)
Reports CBG >600 at 7pm tonight.  C/o nausea, increased thirst, increased urination, and feeling shaky all over.  Denies pain.

## 2013-06-29 ENCOUNTER — Encounter (HOSPITAL_COMMUNITY): Payer: Self-pay | Admitting: Internal Medicine

## 2013-06-29 DIAGNOSIS — J449 Chronic obstructive pulmonary disease, unspecified: Secondary | ICD-10-CM

## 2013-06-29 DIAGNOSIS — R7309 Other abnormal glucose: Secondary | ICD-10-CM

## 2013-06-29 DIAGNOSIS — M549 Dorsalgia, unspecified: Secondary | ICD-10-CM

## 2013-06-29 DIAGNOSIS — E1110 Type 2 diabetes mellitus with ketoacidosis without coma: Secondary | ICD-10-CM

## 2013-06-29 LAB — BASIC METABOLIC PANEL
BUN: 11 mg/dL (ref 6–23)
CO2: 19 mEq/L (ref 19–32)
Calcium: 8.5 mg/dL (ref 8.4–10.5)
Calcium: 8.4 mg/dL (ref 8.4–10.5)
Creatinine, Ser: 0.57 mg/dL (ref 0.50–1.10)
Creatinine, Ser: 0.53 mg/dL (ref 0.50–1.10)
GFR calc Af Amer: >90 mL/min (ref >90)
GFR calc non Af Amer: >90 mL/min (ref >90)
Glucose, Bld: 221 mg/dL — ABNORMAL HIGH (ref 70–99)
Sodium: 137 mEq/L (ref 135–145)

## 2013-06-29 LAB — GLUCOSE, CAPILLARY
Glucose-Capillary: 522 mg/dL — ABNORMAL HIGH (ref 70–99)
Glucose-Capillary: 426 mg/dL — ABNORMAL HIGH (ref 70–99)
Glucose-Capillary: 309 mg/dL — ABNORMAL HIGH (ref 70–99)
Glucose-Capillary: 229 mg/dL — ABNORMAL HIGH (ref 70–99)
Glucose-Capillary: 152 mg/dL — ABNORMAL HIGH (ref 70–99)
Glucose-Capillary: 153 mg/dL — ABNORMAL HIGH (ref 70–99)
Glucose-Capillary: 303 mg/dL — ABNORMAL HIGH (ref 70–99)
Glucose-Capillary: 291 mg/dL — ABNORMAL HIGH (ref 70–99)

## 2013-06-29 LAB — POCT I-STAT 3, VENOUS BLOOD GAS (G3P V)
Acid-base deficit: 9 mmol/L — ABNORMAL HIGH (ref 0.0–2.0)
Bicarbonate: 17.2 mEq/L — ABNORMAL LOW (ref 20.0–24.0)
O2 Saturation: 54 %

## 2013-06-29 LAB — COMPREHENSIVE METABOLIC PANEL
ALT: 13 U/L (ref 0–35)
Albumin: 3.6 g/dL (ref 3.5–5.2)
Alkaline Phosphatase: 97 U/L (ref 39–117)
Potassium: 4.5 mEq/L (ref 3.5–5.1)
Sodium: 127 mEq/L — ABNORMAL LOW (ref 135–145)
Total Protein: 7.9 g/dL (ref 6.0–8.3)

## 2013-06-29 MED ORDER — BECLOMETHASONE DIPROPIONATE 80 MCG/ACT NA AERS: 1.0000 | INHALATION_SPRAY | Freq: Every day | NASAL | Status: DC

## 2013-06-29 MED ORDER — FLUTICASONE PROPIONATE 50 MCG/ACT NA SUSP
1.0000 | Freq: Every day | NASAL | Status: DC
  Filled 2013-06-29: qty 16

## 2013-06-29 MED ORDER — SODIUM CHLORIDE 0.9 % IV SOLN
INTRAVENOUS | Status: AC
  Administered 2013-06-29: 1000 mL via INTRAVENOUS

## 2013-06-29 MED ORDER — SODIUM CHLORIDE 0.9 % IV SOLN
INTRAVENOUS | Status: DC
  Administered 2013-06-29: 4.8 [IU]/h via INTRAVENOUS
  Filled 2013-06-29: qty 1

## 2013-06-29 MED ORDER — SODIUM CHLORIDE 0.9 % IV SOLN
INTRAVENOUS | Status: DC
  Administered 2013-06-29: 1000 mL via INTRAVENOUS

## 2013-06-29 MED ORDER — POTASSIUM CHLORIDE 10 MEQ/100ML IV SOLN
10.0000 meq | INTRAVENOUS | Status: DC
  Filled 2013-06-29: qty 100

## 2013-06-29 MED ORDER — DEXTROSE-NACL 5-0.45 % IV SOLN
INTRAVENOUS | Status: DC
  Administered 2013-06-29: 1000 mL via INTRAVENOUS

## 2013-06-29 MED ORDER — DEXTROSE 50 % IV SOLN: 25.0000 mL | INTRAVENOUS | Status: DC | PRN

## 2013-06-29 MED ORDER — ONDANSETRON HCL 4 MG/2ML IJ SOLN
4.0000 mg | Freq: Once | INTRAMUSCULAR | Status: AC
  Administered 2013-06-29: 4 mg via INTRAVENOUS
  Filled 2013-06-29: qty 2

## 2013-06-29 MED ORDER — LIVING WELL WITH DIABETES BOOK
Freq: Once | Status: AC
  Administered 2013-06-29: 12:00:00
  Filled 2013-06-29: qty 1

## 2013-06-29 MED ORDER — INSULIN PEN STARTER KIT
1.0000 | Freq: Once | Status: AC
  Administered 2013-06-29: 1
  Filled 2013-06-29: qty 1

## 2013-06-29 MED ORDER — ONDANSETRON HCL 4 MG/2ML IJ SOLN
4.0000 mg | Freq: Four times a day (QID) | INTRAMUSCULAR | Status: DC | PRN
  Administered 2013-06-29 (×2): 4 mg via INTRAVENOUS
  Filled 2013-06-29 (×2): qty 2

## 2013-06-29 MED ORDER — SODIUM CHLORIDE 0.9 % IV SOLN
INTRAVENOUS | Status: DC
  Administered 2013-06-29: 22:00:00 via INTRAVENOUS

## 2013-06-29 MED ORDER — POTASSIUM CHLORIDE 10 MEQ/100ML IV SOLN
10.0000 meq | INTRAVENOUS | Status: AC
  Administered 2013-06-29 (×2): 10 meq via INTRAVENOUS
  Filled 2013-06-29: qty 200

## 2013-06-29 MED ORDER — INSULIN ASPART 100 UNIT/ML ~~LOC~~ SOLN
0.0000 [IU] | Freq: Three times a day (TID) | SUBCUTANEOUS | Status: DC
  Administered 2013-06-29: 5 [IU] via SUBCUTANEOUS
  Administered 2013-06-29 – 2013-06-30 (×2): 11 [IU] via SUBCUTANEOUS
  Administered 2013-06-30: 15 [IU] via SUBCUTANEOUS

## 2013-06-29 MED ORDER — PANTOPRAZOLE SODIUM 40 MG PO TBEC
40.0000 mg | DELAYED_RELEASE_TABLET | Freq: Every day | ORAL | Status: DC
  Administered 2013-06-29 – 2013-06-30 (×2): 40 mg via ORAL
  Filled 2013-06-29 (×2): qty 1

## 2013-06-29 MED ORDER — MOMETASONE FURO-FORMOTEROL FUM 100-5 MCG/ACT IN AERO
2.0000 | INHALATION_SPRAY | Freq: Two times a day (BID) | RESPIRATORY_TRACT | Status: DC
  Administered 2013-06-29 – 2013-06-30 (×3): 2 via RESPIRATORY_TRACT
  Filled 2013-06-29: qty 8.8

## 2013-06-29 MED ORDER — POTASSIUM CHLORIDE CRYS ER 20 MEQ PO TBCR
40.0000 meq | EXTENDED_RELEASE_TABLET | Freq: Once | ORAL | Status: AC
  Administered 2013-06-29: 40 meq via ORAL
  Filled 2013-06-29: qty 2

## 2013-06-29 MED ORDER — ENOXAPARIN SODIUM 40 MG/0.4ML ~~LOC~~ SOLN
40.0000 mg | SUBCUTANEOUS | Status: DC
  Administered 2013-06-29 – 2013-06-30 (×2): 40 mg via SUBCUTANEOUS
  Filled 2013-06-29 (×2): qty 0.4

## 2013-06-29 MED ORDER — INSULIN REGULAR HUMAN 100 UNIT/ML IJ SOLN
INTRAMUSCULAR | Status: DC
  Administered 2013-06-29: 4.6 [IU]/h via INTRAVENOUS
  Filled 2013-06-29: qty 1

## 2013-06-29 MED ORDER — INSULIN GLARGINE 100 UNIT/ML ~~LOC~~ SOLN
20.0000 [IU] | Freq: Every day | SUBCUTANEOUS | Status: DC
  Administered 2013-06-29 – 2013-06-30 (×2): 20 [IU] via SUBCUTANEOUS
  Filled 2013-06-29 (×2): qty 0.2

## 2013-06-29 MED ORDER — ALBUTEROL SULFATE HFA 108 (90 BASE) MCG/ACT IN AERS
4.0000 | INHALATION_SPRAY | Freq: Four times a day (QID) | RESPIRATORY_TRACT | Status: DC | PRN
  Administered 2013-06-29: 4 via RESPIRATORY_TRACT
  Filled 2013-06-29: qty 6.7

## 2013-06-29 NOTE — Progress Notes (Signed)
Encouraged  patient to watch Diabetes video.

## 2013-06-29 NOTE — ED Notes (Signed)
I-stat Blood Gas Venous drawn by phlebotomy

## 2013-06-29 NOTE — Progress Notes (Addendum)
Inpatient Diabetes Program Recommendations  AACE/ADA: New Consensus Statement on Inpatient Glycemic Control (2013)  Target Ranges:  Prepandial:   less than 140 mg/dL      Peak postprandial:   less than 180 mg/dL (1-2 hours)      Critically ill patients:  140 - 180 mg/dL     Admitted with DKA.  Newly diagnosed with DM about 2 weeks ago at her PCP's office (Dr. Fleet Contras).  Spoke with pt about new diagnosis.  Explained what an A1C is, basic pathophysiology of DM Type 2, basic home care, importance of checking CBGs and maintaining good CBG control to prevent long-term and short-term complications.  Reviewed signs and symptoms of hyperglycemia and hypoglycemia.  RNs to provide ongoing basic DM education at bedside with this patient.  Have ordered educational booklet, insulin  Pen starter kit, and DM videos. Also ordered RD consult for DM diet education and placed OP DM education referral to the [facility] DM Management center after d/c.  Educated patient on insulin pen use at home.  Reviewed contents of insulin flexpen starter kit.  Reviewed all steps if insulin pen including attachment of needle, 2-unit air shot, dialing up dose, giving injection, removing needle, disposal of sharps, storage of unused insulin, disposal of insulin etc.  Patient able to provide successful return demonstration.  Also reviewed troubleshooting with insulin pen.  MD to give patient Rxs for insulin pens and insulin pen needles.  MD-Patient would prefer insulin pens at discharge if it is necessary to send her home on insulin.  Have showed patient how to use insulin pens and RNs to provide reinforcement at bedside.    Please provide Rxs for  1. Insulin pens: (Lantus Solostar and/or Novolog Flexpen) 2. Insulin pen needles (pen needles- 31 gauge-60mm)  Also: Please consider checking A1c while patient here in hospital- MD, Patient would like to know if she needs an antibody test to check to see if she has  Type 1 or Type 2 diabetes.  Thanks!  Will follow. Ambrose Finland RN, MSN, CDE Diabetes Coordinator Inpatient Diabetes Program [phone]

## 2013-06-29 NOTE — Plan of Care (Signed)
Problem: Phase I Progression Outcomes Goal: Diabetes Coordinator Consult Outcome: Completed/Met Date Met:  06/29/13 Ordered by MD.

## 2013-06-29 NOTE — Progress Notes (Signed)
Pt seen and examined Admitted earlier this am with DKA Anion gap closed, Start lantus, SSI Change IVF to NS, DM education Insulin teaching R flank pain, improved, UA benign Replete K  Zannie Cove, MD [phone]

## 2013-06-29 NOTE — ED Notes (Signed)
Admitting MD at bedside.

## 2013-06-29 NOTE — ED Provider Notes (Signed)
History    CSN: [id number] Arrival date & time 06/28/13  2248  First MD Initiated Contact with Patient 06/28/13 2334     Chief Complaint  Patient presents with  . Hyperglycemia   (Consider location/radiation/quality/duration/timing/severity/associated sxs/prior Treatment) HPI 32 yo female presents to the ER from home with complaint of high blood sugar.  Her meter at home was reading greater than 600.  Pt has had increased thirst, nausea, increased urination, fatigue, shakiness.  Pt was dx with dm 2 weeks ago, placed on Jentadueto 2.5/500 by her PCM, Dr Concepcion Elk.  She denies any vomiting.  No fevers, chills. She has been trying to stick to diabetic diet.    Past Medical History  Diagnosis Date  . Bronchitis   . Asthma   . Diabetes mellitus without complication   . COPD (chronic obstructive pulmonary disease)    Past Surgical History  Procedure Laterality Date  . Abdominal hysterectomy    . Cesarean section     No family history on file. History  Substance Use Topics  . Smoking status: Former Smoker    Types: Cigarettes    Quit date: 06/03/2013  . Smokeless tobacco: Not on file  . Alcohol Use: Yes     Comment: occasional   OB History   Grav Para Term Preterm Abortions TAB SAB Ect Mult Living                 Review of Systems  All other systems reviewed and are negative.    Allergies  Kiwi extract and Strawberry  Home Medications   Current Outpatient Rx  Name  Route  Sig  Dispense  Refill  . albuterol (PROVENTIL HFA;VENTOLIN HFA) 108 (90 BASE) MCG/ACT inhaler   Inhalation   Inhale 4 puffs into the lungs every 6 (six) hours as needed. For shortness of breath         . Beclomethasone Dipropionate (QNASL) 80 MCG/ACT AERS   Nasal   Place 1 spray into the nose daily.         Marland Kitchen esomeprazole (NEXIUM) 40 MG capsule   Oral   Take 40 mg by mouth every other day.         . ibuprofen (ADVIL,MOTRIN) 800 MG tablet   Oral   Take 800 mg by mouth every 8 (eight)  hours as needed for pain.         . mometasone-formoterol (DULERA) 100-5 MCG/ACT AERO   Inhalation   Inhale 2 puffs into the lungs 2 (two) times daily.         . Multiple Vitamin (MULTIVITAMIN WITH MINERALS) TABS   Oral   Take 1 tablet by mouth daily.         . Olopatadine HCl (PATADAY) 0.2 % SOLN   Ophthalmic   Apply 1 drop to eye daily as needed (red/itchy eyes).         . Olopatadine HCl (PATANASE) 0.6 % SOLN   Nasal   Place 1 drop into the nose daily as needed (for allergy symptoms).         . sitaGLIPtan-metformin (JANUMET) 50-500 MG per tablet   Oral   Take 1 tablet by mouth 2 (two) times daily with a meal.          BP 118/77  Pulse 91  Temp(Src) 98.2 F (36.8 C) (Oral)  Resp 18  SpO2 99% Physical Exam  Nursing note and vitals reviewed. Constitutional: She is oriented to person, place, and time. She appears well-developed  and well-nourished.  Obese, fatigued appearing, uncomfortable, anxious  HENT:  Head: Normocephalic and atraumatic.  Nose: Nose normal.  Mouth/Throat: Oropharynx is clear and moist.  Eyes: Conjunctivae and EOM are normal. Pupils are equal, round, and reactive to light.  Neck: Normal range of motion. Neck supple. No JVD present. No tracheal deviation present. No thyromegaly present.  Cardiovascular: Normal rate, regular rhythm, normal heart sounds and intact distal pulses.  Exam reveals no gallop and no friction rub.   No murmur heard. Pulmonary/Chest: Effort normal and breath sounds normal. No stridor. No respiratory distress. She has no wheezes. She has no rales. She exhibits no tenderness.  Abdominal: Soft. Bowel sounds are normal. She exhibits no distension and no mass. There is no tenderness. There is no rebound and no guarding.  Musculoskeletal: Normal range of motion. She exhibits no edema and no tenderness.  Lymphadenopathy:    She has no cervical adenopathy.  Neurological: She is alert and oriented to person, place, and time. No  cranial nerve deficit. She exhibits normal muscle tone. Coordination normal.  Skin: Skin is warm and dry. No rash noted. No erythema. No pallor.  Psychiatric: She has a normal mood and affect. Her behavior is normal. Judgment and thought content normal.    ED Course  Procedures (including critical care time) Labs Reviewed  COMPREHENSIVE METABOLIC PANEL - Abnormal; Notable for the following:    Sodium 127 (*)    Chloride 92 (*)    CO2 17 (*)    Glucose, Bld 750 (*)    All other components within normal limits  URINALYSIS, ROUTINE W REFLEX MICROSCOPIC - Abnormal; Notable for the following:    Color, Urine STRAW (*)    Glucose, UA >1000 (*)    Ketones, ur >80 (*)    All other components within normal limits  GLUCOSE, CAPILLARY - Abnormal; Notable for the following:    Glucose-Capillary >600 (*)    All other components within normal limits  KETONES, QUALITATIVE - Abnormal; Notable for the following:    Acetone, Bld SMALL (*)    All other components within normal limits  POCT I-STAT 3, BLOOD GAS (G3P V) - Abnormal; Notable for the following:    pCO2, Ven 39.1 (*)    Bicarbonate 17.2 (*)    Acid-base deficit 9.0 (*)    All other components within normal limits  CBC  URINE MICROSCOPIC-ADD ON  POCT PREGNANCY, URINE   No results found. 1. Hyperglycemia   2. Diabetes mellitus     MDM  32 yo female with hyperglycemia, anion gap of 17, no acidosis.  Will d/w hospitalist for admission, diabetic teaching.  Olivia Mackie, MD 06/29/13 [phone]

## 2013-06-29 NOTE — Progress Notes (Signed)
Nutrition Brief Note  Patient identified on the Malnutrition Screening Tool (MST) Report.  Per readings below, patient has had a 1.5% weight loss in greater than 1 year; not significant for time frame.  Wt Readings from Last 10 Encounters:  06/29/13 259 lb 11.2 oz (117.8 kg)  05/20/12 263 lb (119.296 kg)    Body mass index is 43.22 kg/(m^2). Patient meets criteria for Obesity Class III based on current BMI.   Current diet order is Carbohydrate Modified Medium Calorie.  Labs and medications reviewed.   No nutrition interventions warranted at this time. If nutrition issues arise, please consult RD.   Maureen Chatters, RD, LDN Pager #: [phone] After-Hours Pager #: [phone]

## 2013-06-29 NOTE — H&P (Signed)
[facility] History and Physical  Sharon Gregory  YQM:[id number]  DOB: 1981-09-01  DOA: 06/28/2013  Referring physician: Dr Norlene Campbell PCP: No primary provider on file.   Chief Complaint: nausea and polyurea  HPI: Sharon Gregory is a 32 y.o. female with Past medical history of recently diagnosed DM, COPD present today with c/o feeling dehydrated and nausea and polyurea with elevated blood glucose. The pt has been having polyurea since last few weeks and has been seen by PCP who diagnosed her with DM and started her on Linagliptin and metformin. She continues to have polyurea and since last 4 days has been feeling weak and tired. Since last 2 days she has been having nausea and that got worse today. When she checked her sugar today it was above 600 and was reccommended to go to the ER. She has been feeling dizzy and lightheaded as well. She does have some left sided flank pain but dose not have any blood in her urine or fever or chills ir urinary symptoms.  Pt denies any fever, chills, headache, nausea, vomiting, abdominal pain, diarrhea, constipation, active bleeding, chest pain, palpitation, pedal edema, orthopnea, PND, focal neurological deficit, burning urination, cough, shortness of breath.   Review of Systems: as mentioned in the history of present illness.  A Comprehensive review of the other systems is negative.  Past Medical History  Diagnosis Date  . Bronchitis   . Asthma   . Diabetes mellitus without complication   . COPD (chronic obstructive pulmonary disease)    Past Surgical History  Procedure Laterality Date  . Abdominal hysterectomy    . Cesarean section     Social History:  reports that she quit smoking about 3 weeks ago. Her smoking use included Cigarettes. She smoked 0.00 packs per day. She does not have any smokeless tobacco history on file. She reports that  drinks alcohol. She reports that she does not use illicit drugs. Patient is coming from  home. Patient can participate in ADLs.  Allergies  Allergen Reactions  . Kiwi Extract     swelling  . Strawberry     hives    No family history on file.  Prior to Admission medications   Medication Sig Start Date End Date Taking? Authorizing Provider  albuterol (PROVENTIL HFA;VENTOLIN HFA) 108 (90 BASE) MCG/ACT inhaler Inhale 4 puffs into the lungs every 6 (six) hours as needed. For shortness of breath   Yes Historical Provider, MD  Beclomethasone Dipropionate (QNASL) 80 MCG/ACT AERS Place 1 spray into the nose daily.   Yes Historical Provider, MD  esomeprazole (NEXIUM) 40 MG capsule Take 40 mg by mouth every other day.   Yes Historical Provider, MD  ibuprofen (ADVIL,MOTRIN) 800 MG tablet Take 800 mg by mouth every 8 (eight) hours as needed for pain.   Yes Historical Provider, MD  LINAGLIPTIN-METFORMIN HCL PO Take by mouth.   Yes Historical Provider, MD  mometasone-formoterol (DULERA) 100-5 MCG/ACT AERO Inhale 2 puffs into the lungs 2 (two) times daily.   Yes Historical Provider, MD  Multiple Vitamin (MULTIVITAMIN WITH MINERALS) TABS Take 1 tablet by mouth daily.   Yes Historical Provider, MD  Olopatadine HCl (PATADAY) 0.2 % SOLN Apply 1 drop to eye daily as needed (red/itchy eyes).   Yes Historical Provider, MD  Olopatadine HCl (PATANASE) 0.6 % SOLN Place 1 drop into the nose daily as needed (for allergy symptoms).   Yes Historical Provider, MD    Physical Exam: Filed Vitals:   06/29/13 0100 06/29/13  0130 06/29/13 0200 06/29/13 0215  BP: 117/81 121/80 112/67 102/57  Pulse: 89 85 89 82  Temp:      TempSrc:      Resp: 31 19 21 22   SpO2: 99% 100% 99% 99%    General: Alert, Awake and Oriented to Time, Place and Person. Appear in severe distress Eyes: PERRL ENT: Oral Mucosa clear dry. Neck: no JVD, no Carotid Bruits, no Stiffness Cardiovascular: S1 and S2 Present, Murmur no, Peripheral Pulses Present Respiratory: Clear to Auscultation, Bilateral Air entry equal and  Decreased, Abdomen: Bowel Sound Present, Soft and Non tender, left CVAT present, no Organomegaly Extremities: Pedal edema and calf tenderness absent. Neurologic: Mental status, Motor strength, Sensation, reflexes, Proprioception Grossly Unremarkable.  Labs on Admission:  Basic Metabolic Panel:  Recent Labs Lab 06/28/13 2305  NA 127*  K 4.5  CL 92*  CO2 17*  GLUCOSE 750*  BUN 11  CREATININE 0.77  CALCIUM 9.3   Liver Function Tests:  Recent Labs Lab 06/28/13 2305  AST 9  ALT 13  ALKPHOS 97  BILITOT 0.3  PROT 7.9  ALBUMIN 3.6   No results found for this basename: LIPASE, AMYLASE,  in the last 168 hours No results found for this basename: AMMONIA,  in the last 168 hours CBC:  Recent Labs Lab 06/28/13 2305  WBC 6.1  HGB 13.3  HCT 39.0  MCV 84.1  PLT 237   Cardiac Enzymes: No results found for this basename: CKTOTAL, CKMB, CKMBINDEX, TROPONINI,  in the last 168 hours  BNP (last 3 results) No results found for this basename: PROBNP,  in the last 8760 hours CBG:  Recent Labs Lab 06/28/13 2255 06/29/13 0142  GLUCAP >600* 522*    Radiological Exams on Admission: No results found.  Assessment/Plan Principal Problem:   Diabetic keto-acidosis Active Problems:   left CVA tenderness   COPD (chronic obstructive pulmonary disease)   1. DKA Pt has aniog gap of 18 and elevated blood glucose, blood acetone levels. She does not have any source of infection and likely cause of DKA is ongoing worsening of DM. At present she has received IV insulin bolus and has been started on Insulin drip as well She has been given IV fluids and has on going IV infusion K is less then 5 and we will replace it. NPO until the anion gap is closed. Close observation in step down unit.  2. Left CVAT Unclear etiology. Will get urine culture and US renal to look for cause.  3. Diabetes Will ned insulin treatment and education.  4. COPD Continue home inhalers.  DVT  Prophylaxis: Lovenox 40 mg Q24 hrs Nutrition: NPO  Code Status: full  Family Communication: husband was at bedside.   Author: Lynden Oxford, MD [facility] Pager: [phone] 06/29/2013 2:30 AM    If 7PM-7AM, please contact night-coverage [email or web address] Password [email or web address]

## 2013-06-30 ENCOUNTER — Inpatient Hospital Stay (HOSPITAL_COMMUNITY): Payer: Managed Care, Other (non HMO)

## 2013-06-30 LAB — BASIC METABOLIC PANEL
Calcium: 8.7 mg/dL (ref 8.4–10.5)
GFR calc Af Amer: >90 mL/min (ref >90)
GFR calc non Af Amer: >90 mL/min (ref >90)
Glucose, Bld: 380 mg/dL — ABNORMAL HIGH (ref 70–99)
Potassium: 3.4 mEq/L — ABNORMAL LOW (ref 3.5–5.1)
Sodium: 137 mEq/L (ref 135–145)

## 2013-06-30 LAB — CBC
Hemoglobin: 12.3 g/dL (ref 12.0–15.0)
MCH: 28.3 pg (ref 26.0–34.0)
MCHC: 34.6 g/dL (ref 30.0–36.0)
Platelets: 209 10*3/uL (ref 150–400)

## 2013-06-30 MED ORDER — PEN NEEDLES 31G X 6 MM MISC: 30.0000 | Freq: Three times a day (TID) | Status: DC

## 2013-06-30 MED ORDER — INSULIN GLARGINE 100 UNIT/ML SOLOSTAR PEN: 35.0000 [IU] | PEN_INJECTOR | Freq: Every day | SUBCUTANEOUS | Status: DC

## 2013-06-30 MED ORDER — INSULIN GLARGINE 100 UNIT/ML ~~LOC~~ SOLN
15.0000 [IU] | Freq: Once | SUBCUTANEOUS | Status: AC
  Administered 2013-06-30: 15 [IU] via SUBCUTANEOUS
  Filled 2013-06-30: qty 0.15

## 2013-06-30 MED ORDER — PEN NEEDLES 31G X 6 MM MISC: 50.0000 | Freq: Three times a day (TID) | Status: DC

## 2013-06-30 MED ORDER — UNABLE TO FIND: Status: DC

## 2013-06-30 MED ORDER — INSULIN GLARGINE 100 UNIT/ML ~~LOC~~ SOLN: 35.0000 [IU] | Freq: Every day | SUBCUTANEOUS | Status: DC

## 2013-06-30 NOTE — Plan of Care (Signed)
Problem: Food- and Nutrition-Related Knowledge Deficit (NB-1.1) Goal: Nutrition education Formal process to instruct or train a patient/client in a skill or to impart knowledge to help patients/clients voluntarily manage or modify food choices and eating behavior to maintain or improve health. Outcome: Completed/Met Date Met:  06/30/13  RD consulted for nutrition education regarding diabetes.     No results found for this basename: HGBA1C    RD provided "Carbohydrate Counting for People with Diabetes" handout from the Academy of Nutrition and Dietetics. Discussed different food groups and their effects on blood sugar, emphasizing carbohydrate-containing foods. Provided list of carbohydrates and recommended serving sizes of common foods.  Discussed importance of controlled and consistent carbohydrate intake throughout the day. Provided examples of ways to balance meals/snacks and encouraged intake of high-fiber, whole grain complex carbohydrates. Teach back method used.  Expect fair compliance.  Body mass index is 43.22 kg/(m^2). Pt meets criteria for Obesity Class III based on current BMI.  Current diet order is Carbohydrate Modified Medium Calorie, patient is consuming approximately 75% of meals at this time. Labs and medications reviewed. No further nutrition interventions warranted at this time. If additional nutrition issues arise, please re-consult RD.  Maureen Chatters, RD, LDN Pager #: [phone] After-Hours Pager #: [phone]

## 2013-06-30 NOTE — Progress Notes (Signed)
Utilization Review Completed.  

## 2013-06-30 NOTE — Progress Notes (Signed)
Inpatient Diabetes Program Recommendations  AACE/ADA: New Consensus Statement on Inpatient Glycemic Control (2013)  Target Ranges:  Prepandial:   less than 140 mg/dL      Peak postprandial:   less than 180 mg/dL (1-2 hours)      Critically ill patients:  140 - 180 mg/dL     Results for GEARLDEAN, LOMANTO (MRN [id number]) as of 06/30/2013 10:19  Ref. Range 06/30/2013 05:25  Sodium Latest Range: 135-145 mEq/L 137  Potassium Latest Range: 3.5-5.1 mEq/L 3.4 (L)  Chloride Latest Range: 96-112 mEq/L 102  CO2 Latest Range: 19-32 mEq/L 19  BUN Latest Range: 6-23 mg/dL 7  Creatinine Latest Range: 0.50-1.10 mg/dL 4.09  Calcium Latest Range: 8.4-10.5 mg/dL 8.7  GFR calc non Af Amer Latest Range: >90 mL/min >90  GFR calc Af Amer Latest Range: >90 mL/min >90  Glucose Latest Range: 70-99 mg/dL 811 (H)    **Concern regarding patient's BMET this morning (CO2 19 and glucose 380 mg/dl).  Is patient going back into DKA?    **Fasting glucose extremely elevated this morning.  Patient needs more insulin (basal and rapid-acting).    Patient weight= 117.8kg 117.8 X 0.6 units/kg= 70 total daily dose  1/2 basal= Lantus 35 units daily 1/2 bolus= Continue Moderate SSI and Add meal coverage Novolog 6 units tid with meals  Recommend the following in-hospital insulin adjustments: 1. Increase Lantus to 35 units daily 2. Continue Novolog Moderate SSI 3. Add meal coverage- Novolog 6 units tid with meals   Will follow. Ambrose Finland RN, MSN, CDE Diabetes Coordinator Inpatient Diabetes Program [phone]

## 2013-07-06 NOTE — Discharge Summary (Signed)
Physician Discharge Summary  Sharon Gregory YNW:[id number] DOB: 1980/12/20 DOA: 06/28/2013  PCP: No primary provider on file.  Admit date: 06/28/2013 Discharge date: 06/30/2013  Time spent:42minutes  Recommendations for Outpatient Follow-up:  1. PCP in 1 week 2. [facility] Hbaic 3. Outpatient DM education 4. Tiration of Insulin dose at FU  Discharge Diagnoses:    Diabetic keto-acidosis   COPD (chronic obstructive pulmonary disease)   Obesity   Discharge Condition: improved  Diet recommendation: carb modified  Filed Weights   06/29/13 0300 06/30/13 0400  Weight: 117.8 kg (259 lb 11.2 oz) 117.8 kg (259 lb 11.2 oz)    History of present illness:  HPI: Sharon Gregory is a 32 y.o. female with Past medical history of recently diagnosed DM, COPD present today with c/o feeling dehydrated and nausea and polyurea with elevated blood glucose.  The pt has been having polyurea since last few weeks and has been seen by PCP who diagnosed her with DM and started her on Linagliptin and metformin. She continues to have polyurea and since last 4 days has been feeling weak and tired. Since last 2 days she has been having nausea and that got worse today. When she checked her sugar today it was above 600 and was reccommended to go to the ER. She has been feeling dizzy and lightheaded as well.  She does have some left sided flank pain but dose not have any blood in her urine or fever or chills ir urinary symptoms.      Hospital Course: Sharon Gregory is a 32 y.o. female with Past medical history of recently diagnosed DM on metformin, COPD present to ER with weakness, nausea and polyuria with elevated blood glucose. She was noted to be dehydrated and in DKA. This was treated with IV insulin using glucomander protocol and IVF. Once metabolic acidosis corrected, she was started lantus,  She received DM education, Insulin teaching, was seen by DM coordinator in consult. Hbaic still pending at discharge. R  flank pain on admission, improved with correction of DKA, UA was benign and RUQ USG with any acute abnormality. She is discharged home in a stable condition to Fu with PCP in 1 week, will need further titration of Insulin.   Consultations:  DM coordinator  Discharge Exam: Filed Vitals:   06/30/13 0400 06/30/13 0840 06/30/13 1149 06/30/13 1552  BP: 110/62 118/72 104/53 111/72  Pulse: 67 71  84  Temp: 98 F (36.7 C) 97.9 F (36.6 C) 97.8 F (36.6 C) 97.8 F (36.6 C)  TempSrc: Oral Oral Oral Oral  Resp: 21 19  20   Height:      Weight: 117.8 kg (259 lb 11.2 oz)     SpO2: 100% 100% 100% 100%    General: AAOx3 Cardiovascular: S1s2/RRR Respiratory:CTAB  Discharge Instructions  Discharge Orders   Future Appointments Provider Department Dept Phone   07/28/2013 11:00 AM Orvil Feil Himmelrich, RD [facility] [phone]   Future Orders Complete By Expires     Ambulatory referral to Nutrition and Diabetic Education  As directed     Comments:      Diagnosed with DM twoweeks ago at PCP office (Dr. Fleet Contras).  Works for [employer].  Likely to be d/c'd home on insulin pens.  No A1c results yet.  Would prefer 1:1 education session if possible.  Thanks!  Use cell phone to contact patient: [phone]  Patient: Please call the [facility] after discharge  to schedule an appointment for diabetes education if you do not hear from the center before discharge  [phone]    Diet Carb Modified  As directed     Increase activity slowly  As directed         Medication List    STOP taking these medications       ibuprofen 800 MG tablet  Commonly known as:  ADVIL,MOTRIN      TAKE these medications       albuterol 108 (90 BASE) MCG/ACT inhaler  Commonly known as:  PROVENTIL HFA;VENTOLIN HFA  Inhale 4 puffs into the lungs every 6 (six) hours as needed. For shortness of breath     esomeprazole 40 MG  capsule  Commonly known as:  NEXIUM  Take 40 mg by mouth every other day.     Insulin Glargine 100 UNIT/ML Sopn  Commonly known as:  LANTUS SOLOSTAR  Inject 35 Units into the skin daily.     LINAGLIPTIN-METFORMIN HCL PO  Take by mouth.     mometasone-formoterol 100-5 MCG/ACT Aero  Commonly known as:  DULERA  Inhale 2 puffs into the lungs 2 (two) times daily.     multivitamin with minerals Tabs  Take 1 tablet by mouth daily.     PATADAY 0.2 % Soln  Generic drug:  Olopatadine HCl  Apply 1 drop to eye daily as needed (red/itchy eyes).     PATANASE 0.6 % Soln  Generic drug:  Olopatadine HCl  Place 1 drop into the nose daily as needed (for allergy symptoms).     Pen Needles 31G X 6 MM Misc  50 each by Does not apply route 3 (three) times daily before meals.     QNASL 80 MCG/ACT Aers  Generic drug:  Beclomethasone Dipropionate  Place 1 spray into the nose daily.     UNABLE TO FIND  This note is to excuse Ms.Haith from work due to medical illness requiring hospitalization till 7/28, ok to return to work after follow up with PCP       Allergies  Allergen Reactions  . Kiwi Extract     swelling  . Molds & Smuts Hives and Swelling  . Strawberry     hives       Follow-up Information   Follow up with AVBUERE,EDWIN A, MD. Schedule an appointment as soon as possible for a visit in 1 week.   Contact information:   [address] [place] [address] [phone]        The results of significant diagnostics from this hospitalization (including imaging, microbiology, ancillary and laboratory) are listed below for reference.    Significant Diagnostic Studies: US Renal  06/30/2013   *RADIOLOGY REPORT*  Clinical Data: Right flank tenderness.  Diabetes.  RENAL/URINARY TRACT ULTRASOUND COMPLETE  Comparison:  None.  Findings:  Right Kidney:  Right kidney measures 12.8 cm length.  Normal parenchymal echotexture and thickness.  No focal mass lesions identified.  No  hydronephrosis.  Left Kidney:  Left kidney measures 13 cm length.  Normal parenchymal thickness and echotexture.  No focal mass lesions demonstrated.  No hydronephrosis.  Bladder:  The bladder is poorly distended but no evidence of bladder wall thickening or filling defect is identified.  IMPRESSION: Normal ultrasound appearance of the kidneys and bladder.   Original Report Authenticated By: Burman Nieves, M.D.    Microbiology: Recent Results (from the past 240 hour(s))  MRSA PCR SCREENING     Status: None   Collection Time  06/29/13  2:58 AM      Result Value Range Status   MRSA by PCR NEGATIVE  NEGATIVE Final   Comment:            The GeneXpert MRSA Assay (FDA     approved for NASAL specimens     only), is one component of a     comprehensive MRSA colonization     surveillance program. It is not     intended to diagnose MRSA     infection nor to guide or     monitor treatment for     MRSA infections.     Labs: Basic Metabolic Panel:  Recent Labs Lab 06/30/13 0525  NA 137  K 3.4*  CL 102  CO2 19  GLUCOSE 380*  BUN 7  CREATININE 0.63  CALCIUM 8.7   Liver Function Tests: No results found for this basename: AST, ALT, ALKPHOS, BILITOT, PROT, ALBUMIN,  in the last 168 hours No results found for this basename: LIPASE, AMYLASE,  in the last 168 hours No results found for this basename: AMMONIA,  in the last 168 hours CBC:  Recent Labs Lab 06/30/13 0525  WBC 4.9  HGB 12.3  HCT 35.6*  MCV 81.8  PLT 209   Cardiac Enzymes: No results found for this basename: CKTOTAL, CKMB, CKMBINDEX, TROPONINI,  in the last 168 hours BNP: BNP (last 3 results) No results found for this basename: PROBNP,  in the last 8760 hours CBG:  Recent Labs Lab 06/29/13 2154 06/30/13 0803 06/30/13 1234  GLUCAP 291* 329* 352*       Signed:  Leanore Biggers  [facility] 07/06/2013, 7:34 PM

## 2013-07-28 ENCOUNTER — Encounter: Payer: Self-pay | Admitting: *Deleted

## 2013-07-28 ENCOUNTER — Encounter: Payer: Managed Care, Other (non HMO) | Attending: Internal Medicine | Admitting: *Deleted

## 2013-07-28 VITALS — Ht 64.75 in | Wt 260.5 lb

## 2013-07-28 DIAGNOSIS — E119 Type 2 diabetes mellitus without complications: Secondary | ICD-10-CM | POA: Insufficient documentation

## 2013-07-28 DIAGNOSIS — Z713 Dietary counseling and surveillance: Secondary | ICD-10-CM | POA: Insufficient documentation

## 2013-07-28 NOTE — Patient Instructions (Addendum)
DIABETES SELF-CARE  Feet  Daily inspection of feet (look for red/whit spots, signs of infection, blisters, dryness)  Dryness of skin: Bathe daily and use lotion containing lanolin  Dry/Callus Areas: Use pumice stone to remove callus tissue  Diabetic socks (Thick, mixed cotton/acrylic)  Diabetic shoes (Need MD prescription, Check with insurance for coverage)  Eyes  Have a dilated eye exam yearly  Teeth and Mouth  Brush and Floss Daily  Cleaning/Exam every 6 months  Monitoring Blood Glucose (NOTE: Check per MD instructions)  Fasting glucose (80-120 mg)  Pre-Meal (80-120 mg)  2 hrs after the first bite of a meal (80-160 mg)   HgbA1C  Check every 3-6 months per MD  Goal <7% (or per MD)  Medical Care American Diabetes Association recommends MD visits every 3-6 months  Exercise  Aim for aerobic exercise (walking, biking, hiking, swimming) at least 3 days a week for 30-60 min.   Nutrition Carbohydrates  Turn into blood sugar  Eat carbohydrates as a part of meals and snacks  Avoid the use of concentrated sweets as they promote the rapid rise in blood glucose (sweet tea, juice, regular soda, and sweetened beverages)  Choose sugar substitutes for sweetening  Fiber  Slow the carbohydrates from digesting too quickly and raising blood sugar  Fibrous foods include whole grain breads, cereals, vegetables, beans, peas, and lentils  Use food label to determine foods highest in fiber  Breads should have >2 grams per serving  Cereals should have >3 grams per serving  Sugar  Aim for 0-9 grams per serving  Choose your use of foods containing higher sugar levels wisely  Non-Starchy Vegetables  High in fiber and low in starches/calories  Try to have a large serving a lunch/dinner  Considered a "FREE" choice  High in vitamins, minerals, and anti-oxidants  Deep rich colored vegetables  Proteins  Build and repair tissues; do not contain glucose  Watch  for added carbohydrate to protein (such as gravy, breading, sauces)  Choose lean proteins (low fat meats, egg whites, etc)  Bake, broil, grill, and roast proteins - No frying or breading  Serving size = Size of the palm of the hand  Have a protein source at all meals and snacks  Fats  Keep to 1-2 servings per meal  Measure fat servings  Choose reduced fat or low-fat varieties of salad dressings and condiments  Use food label for serving sizes

## 2013-07-28 NOTE — Progress Notes (Signed)
Appt start time: 1100 end time:  1230.  Assessment:  Patient was seen on  07/28/2013 for individual diabetes education.   Current HbA1c:   MEDICATIONS:    DIETARY INTAKE:  Usual eating pattern includes 1-2 meals and 0-1 snacks per day.  24-hr recall:  Will obtain at next visit  Estimated energy needs: 1500 calories 165 g carbohydrates TBD g protein TBD g fat  Progress Towards Goal(s):  In progress.   Nutritional Diagnosis:  [place]-2.1 Inpaired nutrition utilization As related to glucose metabolism.  As evidenced by  .    Intervention:  Nutrition counseling provided.  Discussed diabetes disease process and treatment options.  Discussed physiology of diabetes and role of obesity on insulin resistance.  Encouraged moderate weight reduction to improve glucose levels.  Discussed role of medications and diet in glucose control  Provided education on macronutrients on glucose levels.  Provided education on carb counting, importance of regularly scheduled meals/snacks, and meal planning  Discussed effects of physical activity on glucose levels and long-term glucose control.  Recommended 150 minutes of physical activity/week.  Reviewed patient medications.  Discussed role of medication on blood glucose and possible side effects  Discussed blood glucose monitoring and interpretation.  Discussed recommended target ranges and individual ranges.    Described short-term complications: hyper- and hypo-glycemia.  Discussed causes,symptoms, and treatment options.  Discussed prevention, detection, and treatment of long-term complications.  Discussed the role of prolonged elevated glucose levels on body systems.  Discussed role of stress on blood glucose levels and discussed strategies to manage psychosocial issues.  Discussed recommendations for long-term diabetes self-care.  Established checklist for medical, dental, and emotional self-care.  Handouts given during visit include:  Living Well  with Diabetes book (MERCK)  Target Blood Glucose Levels  Low CHO Snack List  Barriers to learning/adherance to lifestyle change: None noted  Diabetes self-care support plan:   [facility] support group  Monitoring/Evaluation:  Dietary intake, exercise, A1c, blood glucose trends, and body weight.

## 2013-08-11 ENCOUNTER — Ambulatory Visit: Payer: Managed Care, Other (non HMO) | Admitting: *Deleted

## 2013-10-26 IMAGING — CR DG CHEST 2V
2 series · 2 of 2 positions shown · non-contrast
Comparison: 07/28/2011

CLINICAL DATA: Chest pain, wheezing

CHEST - 2 VIEW

[w chest pa]
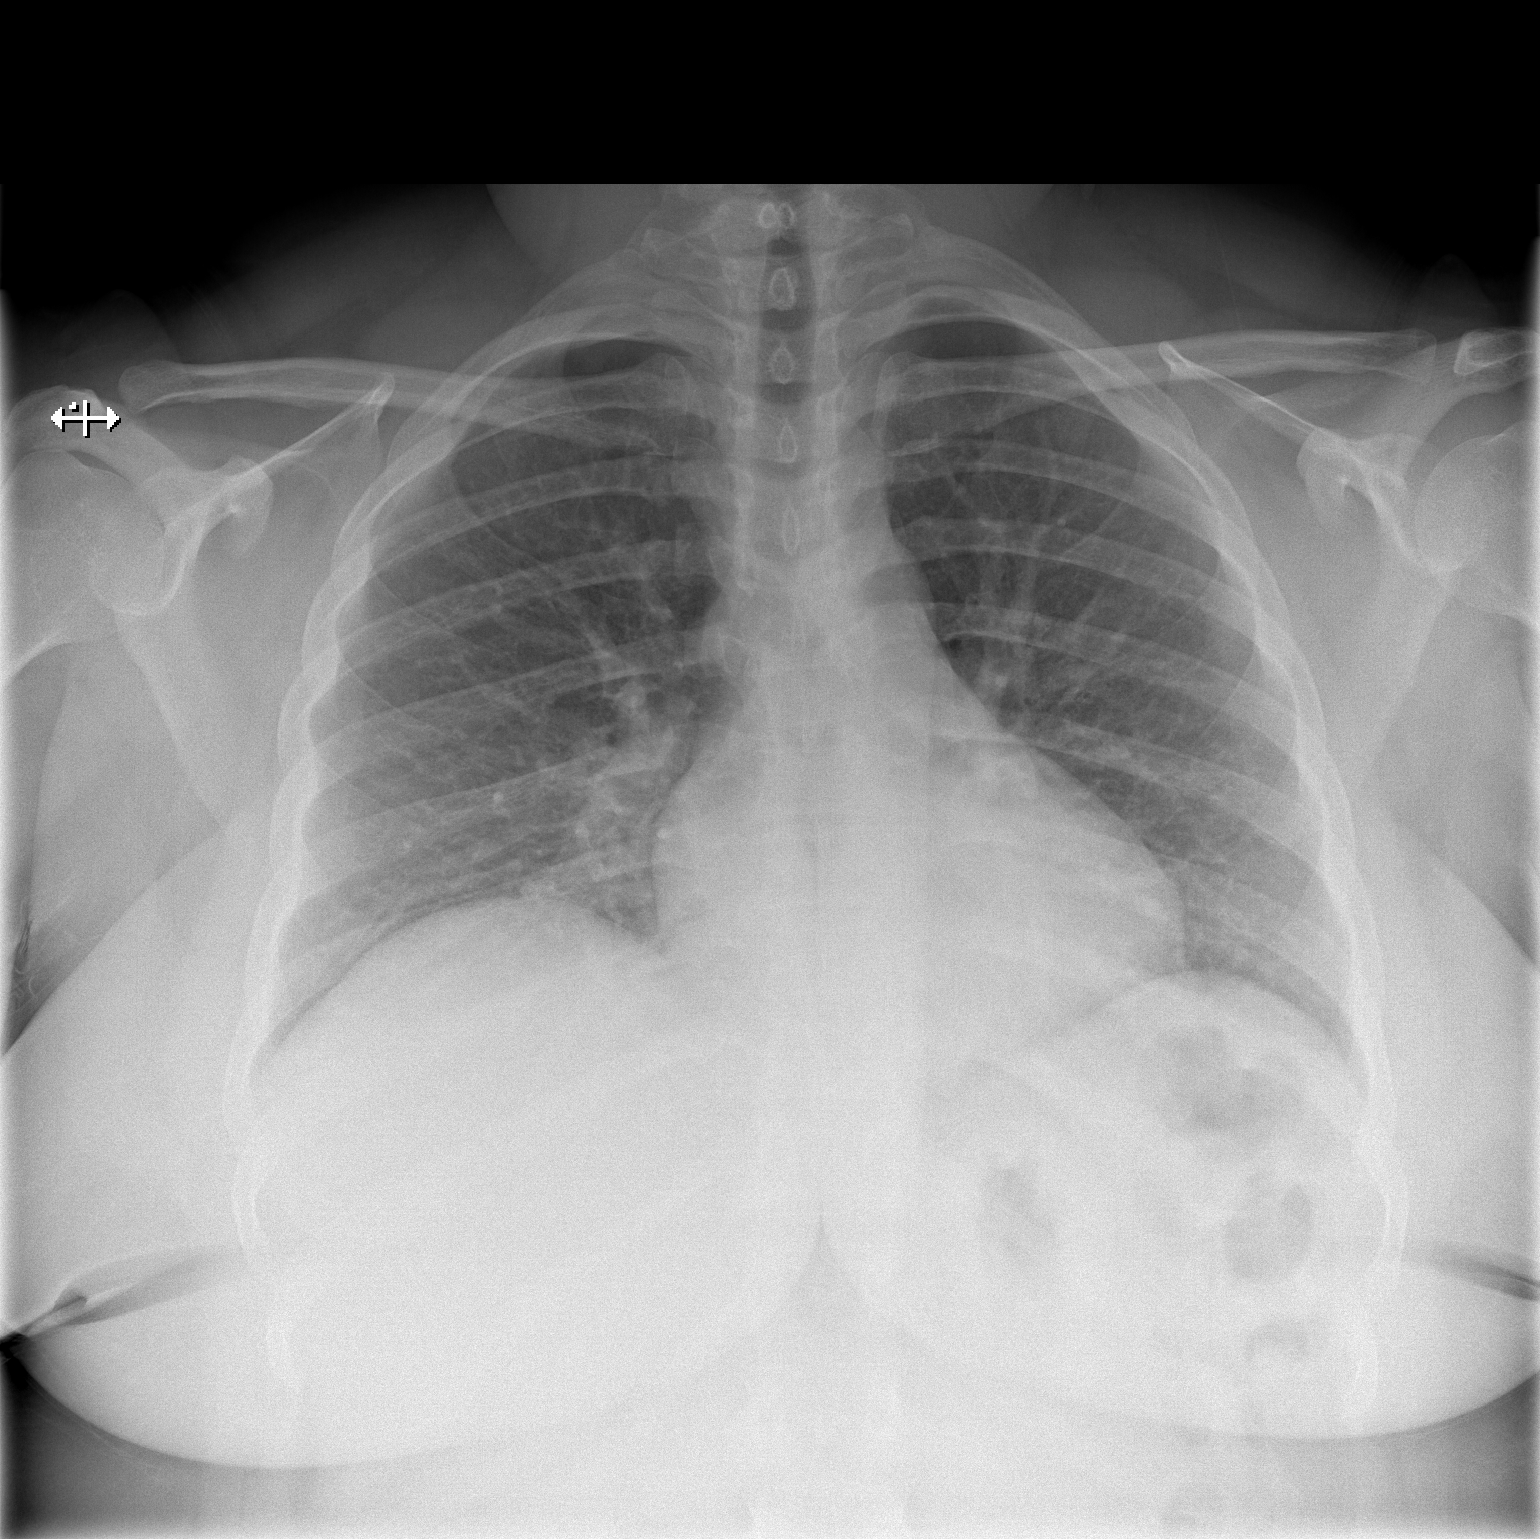

[w chest lat]
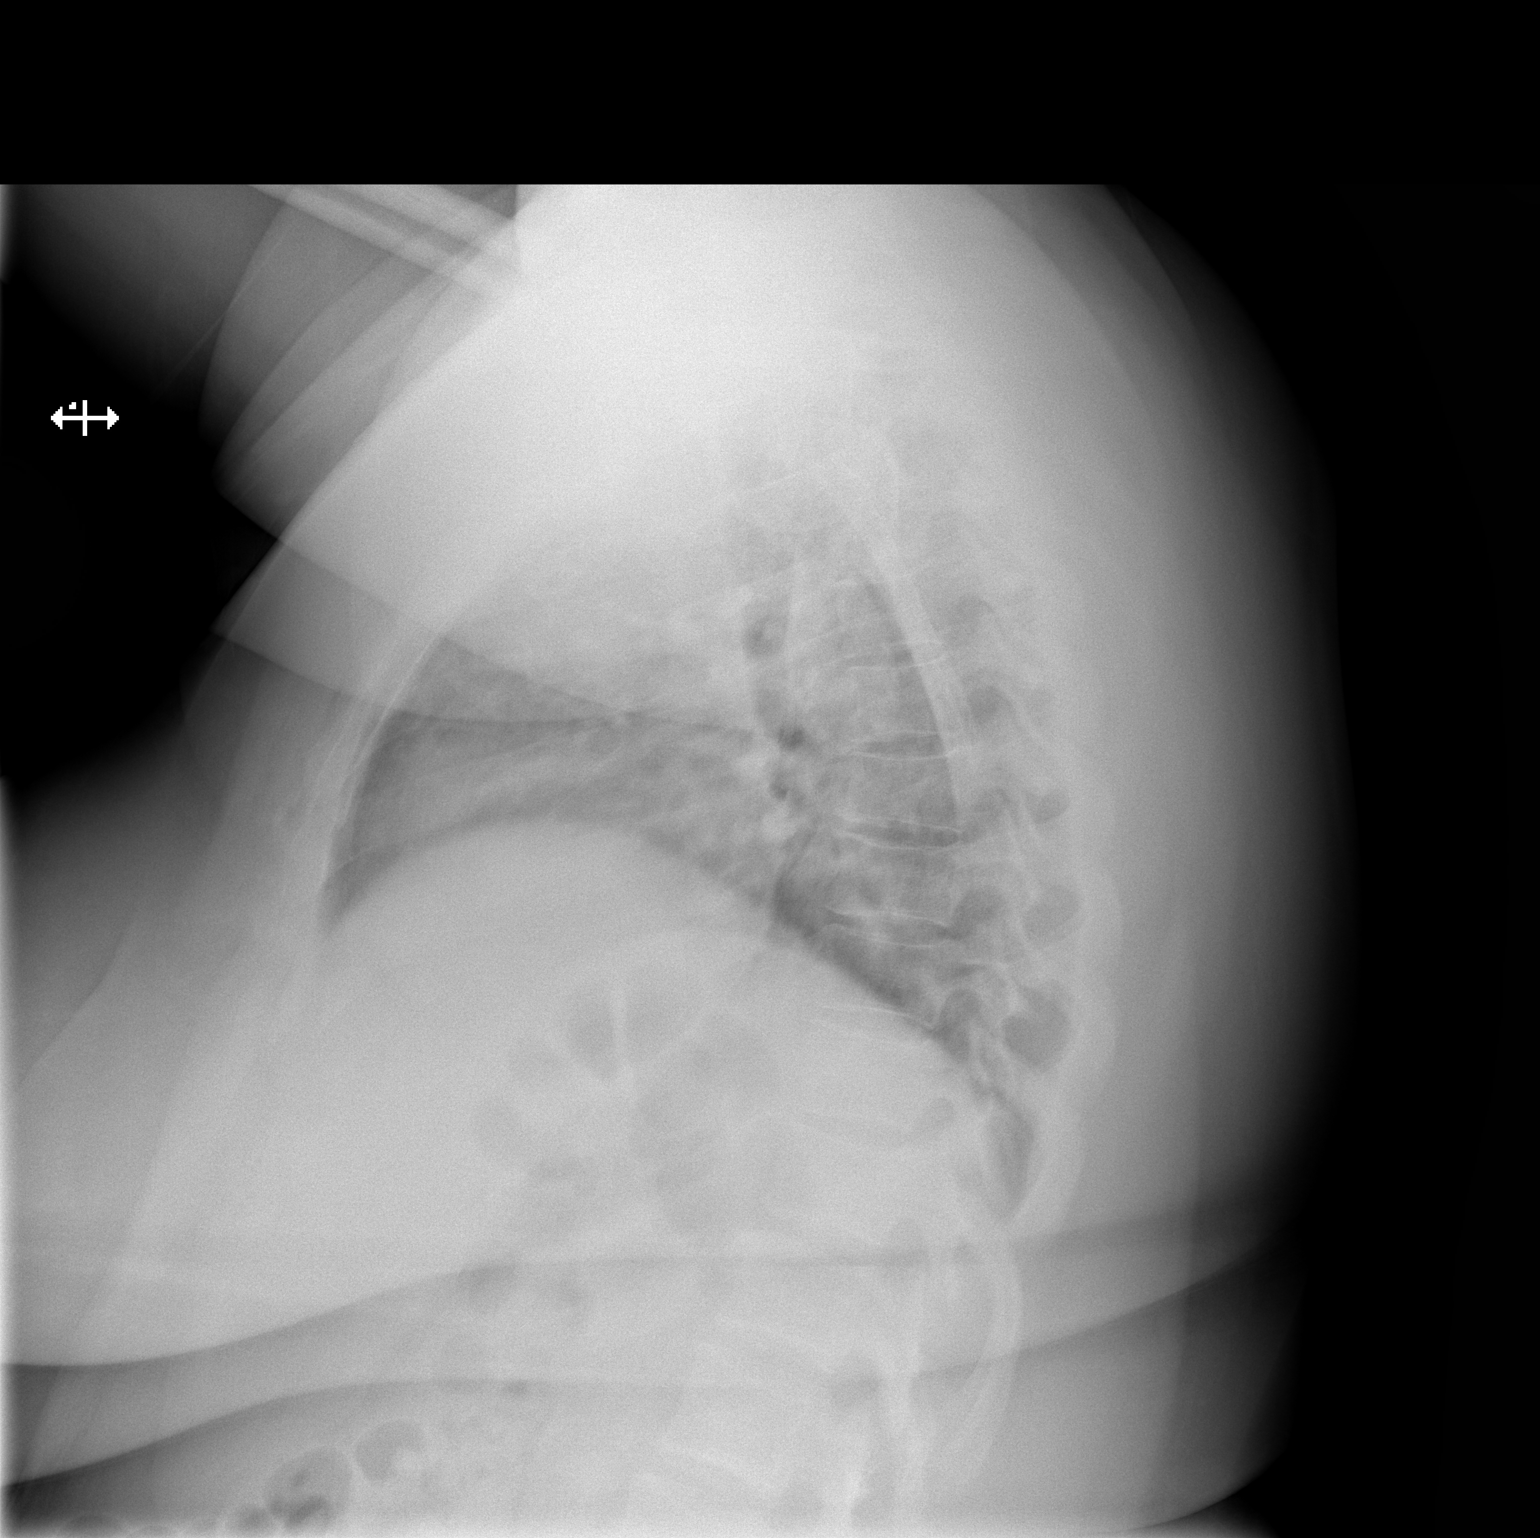

[2 of 2 positions shown; findings below may reference images not displayed]

FINDINGS: Cardiomediastinal silhouette is stable.  No acute
infiltrate or pleural effusion.  No pulmonary edema.  Bony thorax
is stable.
IMPRESSION: No active disease.  No significant change.

## 2013-12-16 ENCOUNTER — Other Ambulatory Visit: Payer: Self-pay | Admitting: Physician Assistant

## 2013-12-16 DIAGNOSIS — H905 Unspecified sensorineural hearing loss: Secondary | ICD-10-CM

## 2013-12-16 DIAGNOSIS — H903 Sensorineural hearing loss, bilateral: Secondary | ICD-10-CM

## 2013-12-16 DIAGNOSIS — H9311 Tinnitus, right ear: Secondary | ICD-10-CM

## 2013-12-31 ENCOUNTER — Other Ambulatory Visit: Payer: Managed Care, Other (non HMO)

## 2014-02-15 DIAGNOSIS — F0781 Postconcussional syndrome: Secondary | ICD-10-CM

## 2014-02-15 HISTORY — DX: Postconcussional syndrome: F07.81

## 2014-02-16 ENCOUNTER — Other Ambulatory Visit: Payer: Self-pay | Admitting: Internal Medicine

## 2014-02-16 DIAGNOSIS — R519 Headache, unspecified: Secondary | ICD-10-CM

## 2014-02-16 DIAGNOSIS — R51 Headache: Principal | ICD-10-CM

## 2014-02-17 ENCOUNTER — Ambulatory Visit
Admission: RE | Admit: 2014-02-17 | Discharge: 2014-02-17 | Disposition: A | Discharge status: Home / Self Care | Payer: Managed Care, Other (non HMO) | Source: Ambulatory Visit | Attending: Internal Medicine | Admitting: Internal Medicine

## 2014-02-17 ENCOUNTER — Other Ambulatory Visit: Payer: Self-pay | Admitting: Internal Medicine

## 2014-02-17 ENCOUNTER — Inpatient Hospital Stay
Admission: RE | Admit: 2014-02-17 | Discharge: 2014-02-17 | Disposition: A | Discharge status: Home / Self Care | Payer: Managed Care, Other (non HMO) | Source: Ambulatory Visit | Attending: Internal Medicine | Admitting: Internal Medicine

## 2014-02-17 DIAGNOSIS — R519 Headache, unspecified: Secondary | ICD-10-CM

## 2014-02-17 DIAGNOSIS — R0789 Other chest pain: Secondary | ICD-10-CM

## 2014-02-17 DIAGNOSIS — R51 Headache: Principal | ICD-10-CM

## 2014-03-30 ENCOUNTER — Ambulatory Visit (INDEPENDENT_AMBULATORY_CARE_PROVIDER_SITE_OTHER): Payer: PRIVATE HEALTH INSURANCE | Admitting: Diagnostic Neuroimaging

## 2014-03-30 ENCOUNTER — Encounter: Payer: Self-pay | Admitting: Diagnostic Neuroimaging

## 2014-03-30 ENCOUNTER — Encounter (INDEPENDENT_AMBULATORY_CARE_PROVIDER_SITE_OTHER): Payer: Self-pay

## 2014-03-30 VITALS — BP 137/90 | HR 96 | Ht 64.75 in | Wt 277.0 lb

## 2014-03-30 DIAGNOSIS — G44309 Post-traumatic headache, unspecified, not intractable: Secondary | ICD-10-CM

## 2014-03-30 DIAGNOSIS — F0781 Postconcussional syndrome: Secondary | ICD-10-CM

## 2014-03-30 MED ORDER — AMITRIPTYLINE HCL 25 MG PO TABS: 25.0000 mg | ORAL_TABLET | Freq: Every day | ORAL | Status: DC
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
## 2014-03-30 NOTE — Patient Instructions (Signed)
Post-Concussion Syndrome Post-concussion syndrome describes the symptoms that can occur after a head injury. These symptoms can last from weeks to months. CAUSES  It is not clear why some head injuries cause post-concussion syndrome. It can occur whether your head injury was mild or severe and whether you were wearing head protection or not.  SIGNS AND SYMPTOMS  Memory difficulties.  Dizziness.  Headaches.  Double vision or blurry vision.  Sensitivity to light.  Hearing difficulties.  Depression.  Tiredness.  Weakness.  Difficulty with concentration.  Difficulty sleeping or staying asleep.  Vomiting.  Poor balance or instability on your feet.  Slow reaction time.  Difficulty learning and remembering things you have heard. DIAGNOSIS  There is no test to determine whether you have post-concussion syndrome. Your health care provider may order an imaging scan of your brain, such as a CT scan, to check for other problems that may be causing your symptoms (such as severe injury inside your skull). TREATMENT  Usually, these problems disappear over time without medical care. Your health care provider may prescribe medicine to help ease your symptoms. It is important to follow up with a neurologist to evaluate your recovery and address any lingering symptoms or issues. HOME CARE INSTRUCTIONS   Only take over-the-counter or prescription medicines for pain, discomfort, or fever as directed by your health care provider. Do not take aspirin. Aspirin can slow blood clotting.  Sleep with your head slightly elevated to help with headaches.  Avoid any situation where there is potential for another head injury (football, hockey, soccer, basketball, martial arts, downhill snow sports, and horseback riding). Your condition will get worse every time you experience a concussion. You should avoid these activities until you are evaluated by the appropriate follow-up health care  providers.  Keep all follow-up appointments as directed by your health care provider. SEEK IMMEDIATE MEDICAL CARE IF:  You develop confusion or unusual drowsiness.  You cannot wake the injured person.  You develop nausea or persistent, forceful vomiting.  You feel like you are moving when you are not (vertigo).  You notice the injured person's eyes moving rapidly back and forth. This may be a sign of vertigo.  You have convulsions or faint.  You have severe, persistent headaches that are not relieved by medicine.  You cannot use your arms or legs normally.  Your pupils change size.  You have clear or bloody discharge from the nose or ears.  Your problems are getting worse, not better. MAKE SURE YOU:  Understand these instructions.  Will watch your condition.  Will get help right away if you are not doing well or get worse. Document Released: 05/18/2002 Document Revised: 09/16/2013 Document Reviewed: 06/14/2011 ExitCare Patient Information 2014 [employer].  

## 2014-04-07 ENCOUNTER — Telehealth: Payer: Self-pay | Admitting: *Deleted

## 2014-04-07 NOTE — Telephone Encounter (Signed)
Called pt to inform her per Andrey CampanileSandy, RN that the pt needed to contact her PCP to see about getting a referral to see an Psychiatrist. I advised the pt that if she has any other problems, questions or concerns to call the office. Pt verbalized understanding.
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
## 2014-04-27 ENCOUNTER — Encounter (HOSPITAL_COMMUNITY): Payer: Self-pay | Admitting: Pharmacy Technician

## 2014-04-27 ENCOUNTER — Other Ambulatory Visit: Payer: Self-pay | Admitting: Orthopedic Surgery

## 2014-04-27 NOTE — Progress Notes (Signed)
Andrez GrimeJaclyn Bissell, PA  -  Please enter preop orders in Epic for [employer] - surg date 5/22.  Thanks.

## 2014-04-28 ENCOUNTER — Other Ambulatory Visit: Payer: Self-pay | Admitting: Orthopedic Surgery

## 2014-04-28 ENCOUNTER — Other Ambulatory Visit (HOSPITAL_COMMUNITY): Payer: Self-pay | Admitting: Anesthesiology

## 2014-04-28 NOTE — H&P (Signed)
Sharon Gregory is an 33 y.o. female.   Chief Complaint: R shoulder pain HPI: The patient is a 33 year old female who presents today for follow up of their shoulder. The patient is being followed for their right shoulder pain. They are 9 weeks, 2 days out from injury. Symptoms reported today include: pain, popping (clicking) and pain with weightbearing. Current treatment includes: physical therapy, activity modification and NSAIDs. The following medication has been used for pain control: antiinflammatory medication. The patient presents today following physical therapy. The patient has reported improvement of their symptoms with: Cortisone injections (temporary relief). Note for "Follow-up shoulder": The patient is currently out of work.   Past Medical History  Diagnosis Date  . Bronchitis   . Asthma   . Diabetes mellitus without complication   . COPD (chronic obstructive pulmonary disease)   . Shortness of breath   . Pneumonia   . Sleep apnea     has home cpap  . GERD (gastroesophageal reflux disease)   . [id number](563.1)     Past Surgical History  Procedure Laterality Date  . Abdominal hysterectomy  2008  . Cesarean section  2001, [id number]    x3  . Mouth surgery  May 2012    tooth extraction    Family History  Problem Relation Age of Onset  . Diabetes Mother   . Other Father     DJD  . Other Sister     DJD  . Diabetes Maternal Grandmother   . Diabetes Paternal Grandmother   . Other Paternal Grandfather     DJD  . Other Sister     DJD   Social History:  reports that she quit smoking about 10 months ago. Her smoking use included Cigarettes. She has a 2.5 pack-year smoking history. She has never used smokeless tobacco. She reports that she drinks alcohol. She reports that she does not use illicit drugs.  Allergies:  Allergies  Allergen Reactions  . Kiwi Extract     swelling  . Molds & Smuts Hives and Swelling  . Strawberry     hives     (Not in a hospital  admission)  No results found for this or any previous visit (from the past 48 hour(s)). No results found.  Review of Systems  Constitutional: Negative.   HENT: Negative.   Eyes: Negative.   Respiratory: Negative.   Cardiovascular: Negative.   Gastrointestinal: Negative.   Genitourinary: Negative.   Musculoskeletal: Positive for joint pain.  Skin: Negative.   Neurological: Negative.   Psychiatric/Behavioral: Negative.     There were no vitals taken for this visit. Physical Exam  Constitutional: She is oriented to person, place, and time. She appears well-developed and well-nourished.  HENT:  Head: Normocephalic and atraumatic.  Eyes: Conjunctivae and EOM are normal. Pupils are equal, round, and reactive to light.  Neck: Normal range of motion. Neck supple.  Cardiovascular: Normal rate and regular rhythm.   Respiratory: Effort normal and breath sounds normal.  GI: Soft. Bowel sounds are normal.  Musculoskeletal:  On exam positive impingement sign of the shoulder. Positive secondary impingement sign of the shoulder. Decreased internal rotation. Nontender over the [facility] Tender in the anterior subacromial region. Inspection of the shoulder revealed no ecchymosis, soft tissue swelling, or deformity. No sulcus sign. Negative Speed test. Negative lift-off. Sensory exam was intact and motor function was normal in the deltoid and the rotator cuff.  Neurological: She is alert and oriented to person, place, and time. She has  normal reflexes.  Skin: Skin is warm and dry.  Psychiatric: She has a normal mood and affect.     Assessment/Plan R shoulder pain, impingement Persistent right rotator cuff arthropathy, temporary relief with subacromial corticosteroid injection. Severe tendinosis. No evidence of tear. Refractory to rest, activity modification, therapy, and injections.  Proceed with shoulder arthroscopy on the right. I had a long discussion with the patient concerning risks and benefits  of shoulder arthroscopy including no change, worsening in symptoms, need for manipulation of the extremity, need for open rotator cuff repair. Also discussed infection, DVT, PE, anesthetic complications, etc. Also included was the possibility of requirement for a repeat debridement in the future. Perioperative course was discussed in detail as well as time for recovery. An illustrated handout was provided and discussed in detail. Follow up in 2 weeks. Return to physical therapy and return her to work in 4-6 weeks.  Plan R shoulder arthroscopy, SAD, debridement  Lockie ParesJaclyn M. Cabe Lashley PA-C for Dr. Shelle IronBeane 04/28/2014, 8:35 PM

## 2014-04-28 NOTE — Patient Instructions (Addendum)
[address] KEAIRA WHITEHURST  04/28/2014   Your procedure is scheduled on: Friday May 22nd, 2015  Report to [facility] Main Entrance and follow signs to  Short Stay Center at 1100 AM.  Call this number if you have problems the morning of surgery [phone]   Remember:  Do not eat food :After Midnight.   clear liquids midnight until 0700 am day of surgery, then nohtig by mouth.   Take these medicines the morning of surgery with A SIP OF WATER: albuterol inhaler if needed, bring inhaler and leave with your spouse, certrizine, nexium, oxycodone if needed                               You may not have any metal on your body including hair pins and piercings  Do not wear jewelry, make-up, lotions, powders, or deodorant.   Men may shave face and neck.  Do not bring valuables to the hospital. [facility] IS NOT RESPONSIBLE FOR VALUABLES.  Contacts, dentures or bridgework may not be worn into surgery.  Leave suitcase in the car. After surgery it may be brought to your room.  For patients admitted to the hospital, checkout time is 11:00 AM the day of discharge.   Patients discharged the day of surgery will not be allowed to drive home.  Name and phone number of your driver:  Special Instructions: N/A  ________________________________________________________________________  [facility] - Preparing for Surgery Before surgery, you can play an important role.  Because skin is not sterile, your skin needs to be as free of germs as possible.  You can reduce the number of germs on your skin by washing with CHG (chlorahexidine gluconate) soap before surgery.  CHG is an antiseptic cleaner which kills germs and bonds with the skin to continue killing germs even after washing. Please DO NOT use if you have an allergy to CHG or antibacterial soaps.  If your skin becomes reddened/irritated stop using the CHG and inform your nurse when you arrive at Short Stay. Do not shave (including legs and underarms)  for at least 48 hours prior to the first CHG shower.  You may shave your face/neck. Please follow these instructions carefully:  1.  Shower with CHG Soap the night before surgery and the  morning of Surgery.  2.  If you choose to wash your hair, wash your hair first as usual with your  normal  shampoo.  3.  After you shampoo, rinse your hair and body thoroughly to remove the  shampoo.                           4.  Use CHG as you would any other liquid soap.  You can apply chg directly  to the skin and wash                       Gently with a scrungie or clean washcloth.  5.  Apply the CHG Soap to your body ONLY FROM THE NECK DOWN.   Do not use on face/ open                           Wound or open sores. Avoid contact with eyes, ears mouth and genitals (private parts).  Wash face,  Genitals (private parts) with your normal soap.             6.  Wash thoroughly, paying special attention to the area where your surgery  will be performed.  7.  Thoroughly rinse your body with warm water from the neck down.  8.  DO NOT shower/wash with your normal soap after using and rinsing off  the CHG Soap.                9.  Pat yourself dry with a clean towel.            10.  Wear clean pajamas.            11.  Place clean sheets on your bed the night of your first shower and do not  sleep with pets. Day of Surgery : Do not apply any lotions/deodorants the morning of surgery.  Please wear clean clothes to the hospital/surgery center.  FAILURE TO FOLLOW THESE INSTRUCTIONS MAY RESULT IN THE CANCELLATION OF YOUR SURGERY PATIENT SIGNATURE_________________________________  NURSE SIGNATURE__________________________________  ________________________________________________________________________    CLEAR LIQUID DIET   Foods Allowed                                                                     Foods Excluded  Coffee and tea, regular and decaf                             liquids  that you cannot  Plain Jell-O in any flavor                                             see through such as: Fruit ices (not with fruit pulp)                                     milk, soups, orange juice  Iced Popsicles                                    All solid food Carbonated beverages, regular and diet                                    Cranberry, grape and apple juices Sports drinks like Gatorade Lightly seasoned clear broth or consume(fat free) Sugar, honey syrup  Sample Menu Breakfast                                Lunch                                     Supper Cranberry juice  Beef broth                            Chicken broth Jell-O                                     Grape juice                           Apple juice Coffee or tea                        Jell-O                                      Popsicle                                                Coffee or tea                        Coffee or tea  _____________________________________________________________________    Incentive Spirometer  An incentive spirometer is a tool that can help keep your lungs clear and active. This tool measures how well you are filling your lungs with each breath. Taking long deep breaths may help reverse or decrease the chance of developing breathing (pulmonary) problems (especially infection) following:  A long period of time when you are unable to move or be active. BEFORE THE PROCEDURE   If the spirometer includes an indicator to show your best effort, your nurse or respiratory therapist will set it to a desired goal.  If possible, sit up straight or lean slightly forward. Try not to slouch.  Hold the incentive spirometer in an upright position. INSTRUCTIONS FOR USE  1. Sit on the edge of your bed if possible, or sit up as far as you can in bed or on a chair. 2. Hold the incentive spirometer in an upright position. 3. Breathe out normally. 4. Place the  mouthpiece in your mouth and seal your lips tightly around it. 5. Breathe in slowly and as deeply as possible, raising the piston or the ball toward the top of the column. 6. Hold your breath for 3-5 seconds or for as long as possible. Allow the piston or ball to fall to the bottom of the column. 7. Remove the mouthpiece from your mouth and breathe out normally. 8. Rest for a few seconds and repeat Steps 1 through 7 at least 10 times every 1-2 hours when you are awake. Take your time and take a few normal breaths between deep breaths. 9. The spirometer may include an indicator to show your best effort. Use the indicator as a goal to work toward during each repetition. 10. After each set of 10 deep breaths, practice coughing to be sure your lungs are clear. If you have an incision (the cut made at the time of surgery), support your incision when coughing by placing a pillow or rolled up towels firmly against it. Once you are able to get out of bed, walk around indoors and cough well. You may stop using the incentive  spirometer when instructed by your caregiver.  RISKS AND COMPLICATIONS  Take your time so you do not get dizzy or light-headed.  If you are in pain, you may need to take or ask for pain medication before doing incentive spirometry. It is harder to take a deep breath if you are having pain. AFTER USE  Rest and breathe slowly and easily.  It can be helpful to keep track of a log of your progress. Your caregiver can provide you with a simple table to help with this. If you are using the spirometer at home, follow these instructions: SEEK MEDICAL CARE IF:   You are having difficultly using the spirometer.  You have trouble using the spirometer as often as instructed.  Your pain medication is not giving enough relief while using the spirometer.  You develop fever of 100.5 F (38.1 C) or higher. SEEK IMMEDIATE MEDICAL CARE IF:   You cough up bloody sputum that had not been present  before.  You develop fever of 102 F (38.9 C) or greater.  You develop worsening pain at or near the incision site. MAKE SURE YOU:   Understand these instructions.  Will watch your condition.  Will get help right away if you are not doing well or get worse. Document Released: 04/08/2007 Document Revised: 02/18/2012 Document Reviewed: 06/09/2007 [facility] Patient Information 2014 [place].   ________________________________________________________________________

## 2014-04-29 ENCOUNTER — Encounter (HOSPITAL_COMMUNITY): Payer: Self-pay

## 2014-04-29 ENCOUNTER — Encounter (HOSPITAL_COMMUNITY)
Admission: RE | Admit: 2014-04-29 | Discharge: 2014-04-29 | Disposition: A | Discharge status: Home / Self Care | Payer: Worker's Compensation | Source: Ambulatory Visit | Attending: Specialist | Admitting: Specialist

## 2014-04-29 HISTORY — DX: Anxiety disorder, unspecified: F41.9

## 2014-04-29 HISTORY — DX: Nausea with vomiting, unspecified: R11.2

## 2014-04-29 HISTORY — DX: Other specified postprocedural states: Z98.890

## 2014-04-29 HISTORY — DX: Unspecified hearing loss, unspecified ear: H91.90

## 2014-04-29 LAB — BASIC METABOLIC PANEL
BUN: 10 mg/dL (ref 6–23)
CALCIUM: 9.8 mg/dL (ref 8.4–10.5)
CO2: 25 mEq/L (ref 19–32)
Chloride: 103 mEq/L (ref 96–112)
Creatinine, Ser: 0.71 mg/dL (ref 0.50–1.10)
GFR calc Af Amer: >90 mL/min (ref >90)
GFR calc non Af Amer: >90 mL/min (ref >90)
GLUCOSE: 123 mg/dL — AB (ref 70–99)
Potassium: 4.1 mEq/L (ref 3.7–5.3)
Sodium: 141 mEq/L (ref 137–147)

## 2014-04-29 LAB — CBC
HCT: 36.4 % (ref 36.0–46.0)
Hemoglobin: 12.1 g/dL (ref 12.0–15.0)
MCH: 28.5 pg (ref 26.0–34.0)
MCHC: 33.2 g/dL (ref 30.0–36.0)
MCV: 85.6 fL (ref 78.0–100.0)
Platelets: 263 10*3/uL (ref 150–400)
RBC: 4.25 MIL/uL (ref 3.87–5.11)
RDW: 13.4 % (ref 11.5–15.5)
WBC: 6.4 10*3/uL (ref 4.0–10.5)

## 2014-04-29 NOTE — Progress Notes (Signed)
Chest 2 view xray 02-17-14 epic

## 2014-04-30 ENCOUNTER — Ambulatory Visit (HOSPITAL_COMMUNITY): Payer: Worker's Compensation | Admitting: Anesthesiology

## 2014-04-30 ENCOUNTER — Encounter (HOSPITAL_COMMUNITY)
Admission: RE | Disposition: A | Discharge status: Home / Self Care | Payer: Self-pay | Source: Ambulatory Visit | Attending: Specialist

## 2014-04-30 ENCOUNTER — Encounter (HOSPITAL_COMMUNITY): Payer: Worker's Compensation | Admitting: Anesthesiology

## 2014-04-30 ENCOUNTER — Ambulatory Visit (HOSPITAL_COMMUNITY)
Admission: RE | Admit: 2014-04-30 | Discharge: 2014-05-01 | Disposition: A | Discharge status: Home / Self Care | Payer: Worker's Compensation | Source: Ambulatory Visit | Attending: Specialist | Admitting: Specialist

## 2014-04-30 ENCOUNTER — Encounter (HOSPITAL_COMMUNITY): Payer: Self-pay | Admitting: *Deleted

## 2014-04-30 DIAGNOSIS — M25819 Other specified joint disorders, unspecified shoulder: Secondary | ICD-10-CM | POA: Insufficient documentation

## 2014-04-30 DIAGNOSIS — Z6841 Body Mass Index (BMI) 40.0 and over, adult: Secondary | ICD-10-CM | POA: Insufficient documentation

## 2014-04-30 DIAGNOSIS — Z01812 Encounter for preprocedural laboratory examination: Secondary | ICD-10-CM | POA: Insufficient documentation

## 2014-04-30 DIAGNOSIS — M7541 Impingement syndrome of right shoulder: Secondary | ICD-10-CM | POA: Diagnosis present

## 2014-04-30 DIAGNOSIS — Z90710 Acquired absence of both cervix and uterus: Secondary | ICD-10-CM | POA: Insufficient documentation

## 2014-04-30 DIAGNOSIS — Z87891 Personal history of nicotine dependence: Secondary | ICD-10-CM | POA: Insufficient documentation

## 2014-04-30 DIAGNOSIS — E119 Type 2 diabetes mellitus without complications: Secondary | ICD-10-CM | POA: Insufficient documentation

## 2014-04-30 DIAGNOSIS — G4730 Sleep apnea, unspecified: Secondary | ICD-10-CM | POA: Insufficient documentation

## 2014-04-30 DIAGNOSIS — Z794 Long term (current) use of insulin: Secondary | ICD-10-CM | POA: Insufficient documentation

## 2014-04-30 DIAGNOSIS — J449 Chronic obstructive pulmonary disease, unspecified: Secondary | ICD-10-CM | POA: Insufficient documentation

## 2014-04-30 DIAGNOSIS — K219 Gastro-esophageal reflux disease without esophagitis: Secondary | ICD-10-CM | POA: Insufficient documentation

## 2014-04-30 DIAGNOSIS — M7580 Other shoulder lesions, unspecified shoulder: Principal | ICD-10-CM

## 2014-04-30 DIAGNOSIS — J4489 Other specified chronic obstructive pulmonary disease: Secondary | ICD-10-CM | POA: Insufficient documentation

## 2014-04-30 HISTORY — PX: SHOULDER ARTHROSCOPY WITH SUBACROMIAL DECOMPRESSION: SHX5684

## 2014-04-30 LAB — GLUCOSE, CAPILLARY
GLUCOSE-CAPILLARY: 174 mg/dL — AB (ref 70–99)
Glucose-Capillary: 110 mg/dL — ABNORMAL HIGH (ref 70–99)
Glucose-Capillary: 93 mg/dL (ref 70–99)
Glucose-Capillary: 158 mg/dL — ABNORMAL HIGH (ref 70–99)

## 2014-04-30 SURGERY — SHOULDER ARTHROSCOPY WITH SUBACROMIAL DECOMPRESSION
Anesthesia: General | Site: Shoulder | Laterality: Right

## 2014-04-30 MED ORDER — INSULIN ASPART 100 UNIT/ML ~~LOC~~ SOLN
0.0000 [IU] | Freq: Three times a day (TID) | SUBCUTANEOUS | Status: DC
  Administered 2014-05-01: 3 [IU] via SUBCUTANEOUS

## 2014-04-30 MED ORDER — HYDROMORPHONE HCL PF 1 MG/ML IJ SOLN
0.2500 mg | INTRAMUSCULAR | Status: DC | PRN
  Administered 2014-04-30 (×4): 0.5 mg via INTRAVENOUS

## 2014-04-30 MED ORDER — OXYCODONE-ACETAMINOPHEN 5-325 MG PO TABS: 1.0000 | ORAL_TABLET | ORAL | Status: DC | PRN

## 2014-04-30 MED ORDER — ACETAMINOPHEN 650 MG RE SUPP
650.0000 mg | Freq: Four times a day (QID) | RECTAL | Status: DC | PRN
Start: 2014-04-30 — End: 2014-05-01

## 2014-04-30 MED ORDER — INSULIN GLARGINE 100 UNIT/ML ~~LOC~~ SOLN
50.0000 [IU] | Freq: Every morning | SUBCUTANEOUS | Status: DC
  Administered 2014-05-01: 50 [IU] via SUBCUTANEOUS
  Filled 2014-04-30: qty 0.5

## 2014-04-30 MED ORDER — METHOCARBAMOL 500 MG PO TABS
500.0000 mg | ORAL_TABLET | Freq: Four times a day (QID) | ORAL | Status: DC | PRN
  Administered 2014-04-30 – 2014-05-01 (×2): 500 mg via ORAL
  Filled 2014-04-30 (×2): qty 1

## 2014-04-30 MED ORDER — ONDANSETRON HCL 4 MG/2ML IJ SOLN
4.0000 mg | Freq: Four times a day (QID) | INTRAMUSCULAR | Status: DC | PRN
Start: 2014-04-30 — End: 2014-05-01
  Administered 2014-04-30 – 2014-05-01 (×2): 4 mg via INTRAVENOUS
  Filled 2014-04-30 (×2): qty 2

## 2014-04-30 MED ORDER — CEFAZOLIN SODIUM-DEXTROSE 2-3 GM-% IV SOLR
2.0000 g | Freq: Four times a day (QID) | INTRAVENOUS | Status: AC
  Administered 2014-04-30 – 2014-05-01 (×3): 2 g via INTRAVENOUS
  Filled 2014-04-30 (×3): qty 50

## 2014-04-30 MED ORDER — METOCLOPRAMIDE HCL 5 MG/ML IJ SOLN
INTRAMUSCULAR | Status: DC | PRN
  Administered 2014-04-30: 5 mg via INTRAVENOUS

## 2014-04-30 MED ORDER — LACTATED RINGERS IR SOLN
Status: DC | PRN
  Administered 2014-04-30: 6000 mL

## 2014-04-30 MED ORDER — PROPOFOL 10 MG/ML IV BOLUS
INTRAVENOUS | Status: DC | PRN
  Administered 2014-04-30: 200 mg via INTRAVENOUS

## 2014-04-30 MED ORDER — CLINDAMYCIN PHOSPHATE 900 MG/50ML IV SOLN
900.0000 mg | INTRAVENOUS | Status: AC
  Administered 2014-04-30: 900 mg via INTRAVENOUS

## 2014-04-30 MED ORDER — PANTOPRAZOLE SODIUM 40 MG PO TBEC
40.0000 mg | DELAYED_RELEASE_TABLET | Freq: Every day | ORAL | Status: DC
  Administered 2014-05-01: 40 mg via ORAL
  Filled 2014-04-30: qty 1

## 2014-04-30 MED ORDER — FENTANYL CITRATE 0.05 MG/ML IJ SOLN
INTRAMUSCULAR | Status: AC
  Filled 2014-04-30: qty 2

## 2014-04-30 MED ORDER — METOCLOPRAMIDE HCL 5 MG/ML IJ SOLN
INTRAMUSCULAR | Status: AC
  Filled 2014-04-30: qty 2

## 2014-04-30 MED ORDER — ENOXAPARIN SODIUM 40 MG/0.4ML ~~LOC~~ SOLN
40.0000 mg | SUBCUTANEOUS | Status: DC
  Administered 2014-05-01: 40 mg via SUBCUTANEOUS
  Filled 2014-04-30 (×3): qty 0.4

## 2014-04-30 MED ORDER — PROPOFOL 10 MG/ML IV BOLUS
INTRAVENOUS | Status: AC
  Filled 2014-04-30: qty 20

## 2014-04-30 MED ORDER — PHENYLEPHRINE HCL 10 MG/ML IJ SOLN
INTRAMUSCULAR | Status: AC
  Filled 2014-04-30: qty 1

## 2014-04-30 MED ORDER — GLYCOPYRROLATE 0.2 MG/ML IJ SOLN
INTRAMUSCULAR | Status: AC
  Filled 2014-04-30: qty 3

## 2014-04-30 MED ORDER — DEXAMETHASONE SODIUM PHOSPHATE 10 MG/ML IJ SOLN
INTRAMUSCULAR | Status: AC
  Filled 2014-04-30: qty 1

## 2014-04-30 MED ORDER — LIDOCAINE HCL (CARDIAC) 20 MG/ML IV SOLN
INTRAVENOUS | Status: AC
  Filled 2014-04-30: qty 5

## 2014-04-30 MED ORDER — LACTATED RINGERS IV SOLN
INTRAVENOUS | Status: DC
  Administered 2014-04-30: 1000 mL via INTRAVENOUS

## 2014-04-30 MED ORDER — ACETAMINOPHEN 325 MG PO TABS
650.0000 mg | ORAL_TABLET | Freq: Four times a day (QID) | ORAL | Status: DC | PRN
Start: 2014-04-30 — End: 2014-05-01

## 2014-04-30 MED ORDER — HYDROMORPHONE HCL PF 1 MG/ML IJ SOLN
0.5000 mg | INTRAMUSCULAR | Status: DC | PRN
  Administered 2014-05-01: 0.5 mg via INTRAVENOUS
  Administered 2014-05-01: 1 mg via INTRAVENOUS
  Filled 2014-04-30 (×2): qty 1

## 2014-04-30 MED ORDER — MENTHOL 3 MG MT LOZG
1.0000 | LOZENGE | OROMUCOSAL | Status: DC | PRN
Start: 2014-04-30 — End: 2014-05-01
  Filled 2014-04-30 (×3): qty 9

## 2014-04-30 MED ORDER — PROMETHAZINE HCL 25 MG PO TABS: 25.0000 mg | ORAL_TABLET | Freq: Four times a day (QID) | ORAL | Status: DC | PRN

## 2014-04-30 MED ORDER — DEXTROSE 5 % IV SOLN
500.0000 mg | Freq: Four times a day (QID) | INTRAVENOUS | Status: DC | PRN
  Administered 2014-04-30: 500 mg via INTRAVENOUS
  Filled 2014-04-30: qty 5

## 2014-04-30 MED ORDER — NEOSTIGMINE METHYLSULFATE 10 MG/10ML IV SOLN
INTRAVENOUS | Status: DC | PRN
  Administered 2014-04-30: 4 mg via INTRAVENOUS

## 2014-04-30 MED ORDER — INSULIN ASPART 100 UNIT/ML ~~LOC~~ SOLN: 0.0000 [IU] | SUBCUTANEOUS | Status: DC

## 2014-04-30 MED ORDER — LIDOCAINE HCL (CARDIAC) 20 MG/ML IV SOLN
INTRAVENOUS | Status: DC | PRN
  Administered 2014-04-30 (×2): 75 mg via INTRAVENOUS

## 2014-04-30 MED ORDER — PHENYLEPHRINE HCL 10 MG/ML IJ SOLN
INTRAMUSCULAR | Status: DC | PRN
  Administered 2014-04-30: 80 ug via INTRAVENOUS
  Administered 2014-04-30: 40 ug via INTRAVENOUS
  Administered 2014-04-30: 80 ug via INTRAVENOUS
  Administered 2014-04-30 (×2): 40 ug via INTRAVENOUS
  Administered 2014-04-30: 80 ug via INTRAVENOUS

## 2014-04-30 MED ORDER — OXYCODONE-ACETAMINOPHEN 7.5-325 MG PO TABS: 1.0000 | ORAL_TABLET | ORAL | Status: DC | PRN

## 2014-04-30 MED ORDER — CEFAZOLIN SODIUM-DEXTROSE 2-3 GM-% IV SOLR
INTRAVENOUS | Status: AC
  Filled 2014-04-30: qty 50

## 2014-04-30 MED ORDER — CLINDAMYCIN PHOSPHATE 900 MG/50ML IV SOLN
INTRAVENOUS | Status: AC
  Filled 2014-04-30: qty 50

## 2014-04-30 MED ORDER — ONDANSETRON HCL 4 MG PO TABS: 4.0000 mg | ORAL_TABLET | Freq: Four times a day (QID) | ORAL | Status: DC | PRN

## 2014-04-30 MED ORDER — FENTANYL CITRATE 0.05 MG/ML IJ SOLN
INTRAMUSCULAR | Status: AC
  Filled 2014-04-30: qty 5

## 2014-04-30 MED ORDER — ALBUTEROL SULFATE (2.5 MG/3ML) 0.083% IN NEBU: 2.5000 mg | INHALATION_SOLUTION | Freq: Four times a day (QID) | RESPIRATORY_TRACT | Status: DC | PRN

## 2014-04-30 MED ORDER — SUCCINYLCHOLINE CHLORIDE 20 MG/ML IJ SOLN
INTRAMUSCULAR | Status: DC | PRN
  Administered 2014-04-30: 140 mg via INTRAVENOUS

## 2014-04-30 MED ORDER — BUPIVACAINE HCL (PF) 0.5 % IJ SOLN
INTRAMUSCULAR | Status: DC | PRN
  Administered 2014-04-30: 20 mL

## 2014-04-30 MED ORDER — ATROPINE SULFATE 0.4 MG/ML IJ SOLN
INTRAMUSCULAR | Status: AC
  Filled 2014-04-30: qty 1

## 2014-04-30 MED ORDER — LACTATED RINGERS IV SOLN: INTRAVENOUS | Status: DC

## 2014-04-30 MED ORDER — MIDAZOLAM HCL 2 MG/2ML IJ SOLN
INTRAMUSCULAR | Status: AC
  Filled 2014-04-30: qty 2

## 2014-04-30 MED ORDER — NEOSTIGMINE METHYLSULFATE 10 MG/10ML IV SOLN
INTRAVENOUS | Status: AC
  Filled 2014-04-30: qty 1

## 2014-04-30 MED ORDER — BUPIVACAINE-EPINEPHRINE (PF) 0.5% -1:200000 IJ SOLN
INTRAMUSCULAR | Status: AC
  Filled 2014-04-30: qty 30

## 2014-04-30 MED ORDER — SODIUM CHLORIDE 0.45 % IV SOLN
INTRAVENOUS | Status: DC
  Administered 2014-04-30: 18:00:00 via INTRAVENOUS

## 2014-04-30 MED ORDER — ALBUTEROL SULFATE HFA 108 (90 BASE) MCG/ACT IN AERS: 4.0000 | INHALATION_SPRAY | Freq: Four times a day (QID) | RESPIRATORY_TRACT | Status: DC | PRN

## 2014-04-30 MED ORDER — DOCUSATE SODIUM 100 MG PO CAPS: 100.0000 mg | ORAL_CAPSULE | Freq: Two times a day (BID) | ORAL | Status: DC | PRN

## 2014-04-30 MED ORDER — EPHEDRINE SULFATE 50 MG/ML IJ SOLN
INTRAMUSCULAR | Status: AC
  Filled 2014-04-30: qty 1

## 2014-04-30 MED ORDER — DEXTROSE 5 % IV SOLN
3.0000 g | INTRAVENOUS | Status: AC
  Administered 2014-04-30: 3 g via INTRAVENOUS
  Filled 2014-04-30: qty 3000

## 2014-04-30 MED ORDER — FENTANYL CITRATE 0.05 MG/ML IJ SOLN
INTRAMUSCULAR | Status: DC | PRN
  Administered 2014-04-30 (×3): 50 ug via INTRAVENOUS
  Administered 2014-04-30: 100 ug via INTRAVENOUS
  Administered 2014-04-30: 50 ug via INTRAVENOUS
  Administered 2014-04-30: 100 ug via INTRAVENOUS
  Administered 2014-04-30: 50 ug via INTRAVENOUS

## 2014-04-30 MED ORDER — BUPIVACAINE HCL (PF) 0.5 % IJ SOLN
INTRAMUSCULAR | Status: AC
  Filled 2014-04-30: qty 30

## 2014-04-30 MED ORDER — HYDROMORPHONE HCL PF 1 MG/ML IJ SOLN
INTRAMUSCULAR | Status: AC
  Filled 2014-04-30: qty 1

## 2014-04-30 MED ORDER — ONDANSETRON HCL 4 MG/2ML IJ SOLN
INTRAMUSCULAR | Status: DC | PRN
  Administered 2014-04-30: 2 mg via INTRAVENOUS

## 2014-04-30 MED ORDER — DEXAMETHASONE SODIUM PHOSPHATE 10 MG/ML IJ SOLN
INTRAMUSCULAR | Status: DC | PRN
  Administered 2014-04-30: 10 mg via INTRAVENOUS

## 2014-04-30 MED ORDER — PHENYLEPHRINE 40 MCG/ML (10ML) SYRINGE FOR IV PUSH (FOR BLOOD PRESSURE SUPPORT)
PREFILLED_SYRINGE | INTRAVENOUS | Status: AC
  Filled 2014-04-30: qty 10

## 2014-04-30 MED ORDER — MIDAZOLAM HCL 5 MG/5ML IJ SOLN
INTRAMUSCULAR | Status: DC | PRN
  Administered 2014-04-30 (×2): 1 mg via INTRAVENOUS

## 2014-04-30 MED ORDER — AMITRIPTYLINE HCL 25 MG PO TABS
25.0000 mg | ORAL_TABLET | Freq: Every day | ORAL | Status: DC
  Administered 2014-04-30: 25 mg via ORAL
  Filled 2014-04-30 (×3): qty 1

## 2014-04-30 MED ORDER — DOCUSATE SODIUM 100 MG PO CAPS
100.0000 mg | ORAL_CAPSULE | Freq: Two times a day (BID) | ORAL | Status: DC
  Administered 2014-04-30 – 2014-05-01 (×2): 100 mg via ORAL

## 2014-04-30 MED ORDER — GLYCOPYRROLATE 0.2 MG/ML IJ SOLN
INTRAMUSCULAR | Status: DC | PRN
  Administered 2014-04-30: .4 mg via INTRAVENOUS

## 2014-04-30 MED ORDER — LORATADINE 10 MG PO TABS
10.0000 mg | ORAL_TABLET | Freq: Every day | ORAL | Status: DC
  Administered 2014-05-01: 10 mg via ORAL
  Filled 2014-04-30: qty 1

## 2014-04-30 MED ORDER — PHENOL 1.4 % MT LIQD
1.0000 | OROMUCOSAL | Status: DC | PRN
  Filled 2014-04-30: qty 177

## 2014-04-30 MED ORDER — CISATRACURIUM BESYLATE (PF) 10 MG/5ML IV SOLN
INTRAVENOUS | Status: DC | PRN
Start: 2014-04-30 — End: 2014-04-30
  Administered 2014-04-30: 8 mg via INTRAVENOUS

## 2014-04-30 MED ORDER — METOCLOPRAMIDE HCL 10 MG PO TABS: 5.0000 mg | ORAL_TABLET | Freq: Three times a day (TID) | ORAL | Status: DC | PRN

## 2014-04-30 MED ORDER — HYDROCODONE-ACETAMINOPHEN 5-325 MG PO TABS
1.0000 | ORAL_TABLET | ORAL | Status: DC | PRN
  Administered 2014-04-30 – 2014-05-01 (×5): 2 via ORAL
  Filled 2014-04-30 (×5): qty 2

## 2014-04-30 MED ORDER — ACETAMINOPHEN 10 MG/ML IV SOLN
1000.0000 mg | Freq: Once | INTRAVENOUS | Status: AC
  Administered 2014-04-30: 1000 mg via INTRAVENOUS
  Filled 2014-04-30: qty 100

## 2014-04-30 MED ORDER — CISATRACURIUM BESYLATE 20 MG/10ML IV SOLN
INTRAVENOUS | Status: AC
  Filled 2014-04-30: qty 10

## 2014-04-30 MED ORDER — LACTATED RINGERS IV SOLN
INTRAVENOUS | Status: DC | PRN
  Administered 2014-04-30 (×3): via INTRAVENOUS

## 2014-04-30 MED ORDER — METOCLOPRAMIDE HCL 5 MG/ML IJ SOLN
5.0000 mg | Freq: Three times a day (TID) | INTRAMUSCULAR | Status: DC | PRN
  Administered 2014-04-30: 10 mg via INTRAVENOUS
  Filled 2014-04-30: qty 2

## 2014-04-30 MED ORDER — METHOCARBAMOL 500 MG PO TABS: 500.0000 mg | ORAL_TABLET | Freq: Three times a day (TID) | ORAL | Status: DC | PRN

## 2014-04-30 MED ORDER — KETOROLAC TROMETHAMINE 30 MG/ML IJ SOLN
INTRAMUSCULAR | Status: DC | PRN
  Administered 2014-04-30: 30 mg via INTRAVENOUS

## 2014-04-30 SURGICAL SUPPLY — 31 items
BLADE 4.2CUDA (BLADE) ×3 IMPLANT
BLADE SURG SZ11 CARB STEEL (BLADE) ×3 IMPLANT
BOOTIES KNEE HIGH SLOAN (MISCELLANEOUS) ×6 IMPLANT
BUR OVAL 4.0 (BURR) IMPLANT
CANNULA ACUFO 5X76 (CANNULA) ×3 IMPLANT
CLOTH 2% CHLOROHEXIDINE 3PK (PERSONAL CARE ITEMS) ×3 IMPLANT
DRAPE STERI 35X30 U-POUCH (DRAPES) ×3 IMPLANT
DRAPE U-SHAPE 47X51 STRL (DRAPES) ×3 IMPLANT
DRSG AQUACEL AG ADV 3.5X14 (GAUZE/BANDAGES/DRESSINGS) ×6 IMPLANT
DRSG MEPILEX BORDER 4X4 (GAUZE/BANDAGES/DRESSINGS) ×6 IMPLANT
DURAPREP 26ML APPLICATOR (WOUND CARE) ×3 IMPLANT
GLOVE BIOGEL PI IND STRL 7.5 (GLOVE) ×1 IMPLANT
GLOVE BIOGEL PI IND STRL 8 (GLOVE) ×1 IMPLANT
GLOVE BIOGEL PI INDICATOR 7.5 (GLOVE) ×2
GLOVE BIOGEL PI INDICATOR 8 (GLOVE) ×2
GLOVE SURG SS PI 7.5 STRL IVOR (GLOVE) ×3 IMPLANT
GLOVE SURG SS PI 8.0 STRL IVOR (GLOVE) ×3 IMPLANT
GOWN STRL REUS W/TWL XL LVL3 (GOWN DISPOSABLE) ×6 IMPLANT
KIT POSITION SHOULDER SCHLEI (MISCELLANEOUS) ×3 IMPLANT
MANIFOLD NEPTUNE II (INSTRUMENTS) ×3 IMPLANT
NEEDLE SPNL 18GX3.5 QUINCKE PK (NEEDLE) ×3 IMPLANT
PACK SHOULDER CUSTOM OPM052 (CUSTOM PROCEDURE TRAY) ×3 IMPLANT
POSITIONER SURGICAL ARM (MISCELLANEOUS) ×3 IMPLANT
SET ARTHROSCOPY TUBING (MISCELLANEOUS) ×2
SET ARTHROSCOPY TUBING LN (MISCELLANEOUS) ×1 IMPLANT
SLING ARM IMMOBILIZER LRG (SOFTGOODS) ×3 IMPLANT
SLING ARM IMMOBILIZER MED (SOFTGOODS) IMPLANT
SUT ETHILON 4 0 PS 2 18 (SUTURE) ×3 IMPLANT
TUBING CONNECTING 10 (TUBING) ×2 IMPLANT
TUBING CONNECTING 10' (TUBING) ×1
WAND 90 DEG TURBOVAC W/CORD (SURGICAL WAND) IMPLANT

## 2014-04-30 NOTE — Anesthesia Postprocedure Evaluation (Signed)
  Anesthesia Post-op Note  Patient: Sharon Gregory  Procedure(s) Performed: Procedure(s) (LRB): RIGHT SHOULDER ARTHROSCOPY WITH SUBACROMIAL DECOMPRESSION AND DEBRIDEMENT (Right)  Patient Location: PACU  Anesthesia Type: General  Level of Consciousness: awake and alert   Airway and Oxygen Therapy: Patient Spontanous Breathing  Post-op Pain: mild  Post-op Assessment: Post-op Vital signs reviewed, Patient's Cardiovascular Status Stable, Respiratory Function Stable, Patent Airway and No signs of Nausea or vomiting  Last Vitals:  Filed Vitals:   04/30/14 1545  BP: 110/61  Pulse: 94  Temp:   Resp: 11    Post-op Vital Signs: stable   Complications: No apparent anesthesia complications

## 2014-04-30 NOTE — Interval H&P Note (Signed)
History and Physical Interval Note:  04/30/2014 1:01 PM  Sharon Gregory  has presented today for surgery, with the diagnosis of IMPINGEMENT SNYDROME RIGHT SHOULDER  The various methods of treatment have been discussed with the patient and family. After consideration of risks, benefits and other options for treatment, the patient has consented to  Procedure(s): RIGHT SHOULDER ARTHROSCOPY WITH SUBACROMIAL DECOMPRESSION AND DEBRIDEMENT (Right) as a surgical intervention .  The patient's history has been reviewed, patient examined, no change in status, stable for surgery.  I have reviewed the patient's chart and labs.  Questions were answered to the patient's satisfaction.     Javier Docker

## 2014-04-30 NOTE — Progress Notes (Signed)
PACU note----Dr. Leta Jungling, anesthes, notified of irregular heart beats, PVC's run of 2-3, OK to transfer to floor when able, no additional orders rec'd

## 2014-04-30 NOTE — Anesthesia Preprocedure Evaluation (Addendum)
Anesthesia Evaluation  Patient identified by MRN, date of birth, ID band Patient awake    Reviewed: Allergy & Precautions, H&P , NPO status , Patient's Chart, lab work & pertinent test results, reviewed documented beta blocker date and time   History of Anesthesia Complications (+) PONV  Airway Mallampati: III TM Distance: >3 FB Neck ROM: full    Dental no notable dental hx. (+) Teeth Intact, Dental Advisory Given   Pulmonary shortness of breath and with exertion, asthma , sleep apnea , COPDCurrent Smoker,  Mild COPD breath sounds clear to auscultation  Pulmonary exam normal       Cardiovascular Exercise Tolerance: Good negative cardio ROS  Rhythm:regular Rate:Normal     Neuro/Psych  Headaches, Anxiety negative neurological ROS  negative psych ROS   GI/Hepatic negative GI ROS, Neg liver ROS, GERD-  Medicated and Controlled,  Endo/Other  diabetes, Well Controlled, Type 2, Insulin DependentMorbid obesity  Renal/GU negative Renal ROS  negative genitourinary   Musculoskeletal   Abdominal (+) + obese,   Peds  Hematology negative hematology ROS (+)   Anesthesia Other Findings   Reproductive/Obstetrics negative OB ROS                          Anesthesia Physical Anesthesia Plan  ASA: III  Anesthesia Plan: General   Post-op Pain Management:    Induction: Intravenous  Airway Management Planned: Oral ETT  Additional Equipment:   Intra-op Plan:   Post-operative Plan: Extubation in OR  Informed Consent: I have reviewed the patients History and Physical, chart, labs and discussed the procedure including the risks, benefits and alternatives for the proposed anesthesia with the patient or authorized representative who has indicated his/her understanding and acceptance.   Dental Advisory Given  Plan Discussed with: CRNA and Surgeon  Anesthesia Plan Comments:         Anesthesia Quick  Evaluation

## 2014-04-30 NOTE — H&P (View-Only) (Signed)
Sharon Gregory is an 33 y.o. female.   Chief Complaint: R shoulder pain HPI: The patient is a 33 year old female who presents today for follow up of their shoulder. The patient is being followed for their right shoulder pain. They are 9 weeks, 2 days out from injury. Symptoms reported today include: pain, popping (clicking) and pain with weightbearing. Current treatment includes: physical therapy, activity modification and NSAIDs. The following medication has been used for pain control: antiinflammatory medication. The patient presents today following physical therapy. The patient has reported improvement of their symptoms with: Cortisone injections (temporary relief). Note for "Follow-up shoulder": The patient is currently out of work.   Past Medical History  Diagnosis Date  . Bronchitis   . Asthma   . Diabetes mellitus without complication   . COPD (chronic obstructive pulmonary disease)   . Shortness of breath   . Pneumonia   . Sleep apnea     has home cpap  . GERD (gastroesophageal reflux disease)   . [id number](563.1)     Past Surgical History  Procedure Laterality Date  . Abdominal hysterectomy  2008  . Cesarean section  2001, [id number]    x3  . Mouth surgery  May 2012    tooth extraction    Family History  Problem Relation Age of Onset  . Diabetes Mother   . Other Father     DJD  . Other Sister     DJD  . Diabetes Maternal Grandmother   . Diabetes Paternal Grandmother   . Other Paternal Grandfather     DJD  . Other Sister     DJD   Social History:  reports that she quit smoking about 10 months ago. Her smoking use included Cigarettes. She has a 2.5 pack-year smoking history. She has never used smokeless tobacco. She reports that she drinks alcohol. She reports that she does not use illicit drugs.  Allergies:  Allergies  Allergen Reactions  . Kiwi Extract     swelling  . Molds & Smuts Hives and Swelling  . Strawberry     hives     (Not in a hospital  admission)  No results found for this or any previous visit (from the past 48 hour(s)). No results found.  Review of Systems  Constitutional: Negative.   HENT: Negative.   Eyes: Negative.   Respiratory: Negative.   Cardiovascular: Negative.   Gastrointestinal: Negative.   Genitourinary: Negative.   Musculoskeletal: Positive for joint pain.  Skin: Negative.   Neurological: Negative.   Psychiatric/Behavioral: Negative.     There were no vitals taken for this visit. Physical Exam  Constitutional: She is oriented to person, place, and time. She appears well-developed and well-nourished.  HENT:  Head: Normocephalic and atraumatic.  Eyes: Conjunctivae and EOM are normal. Pupils are equal, round, and reactive to light.  Neck: Normal range of motion. Neck supple.  Cardiovascular: Normal rate and regular rhythm.   Respiratory: Effort normal and breath sounds normal.  GI: Soft. Bowel sounds are normal.  Musculoskeletal:  On exam positive impingement sign of the shoulder. Positive secondary impingement sign of the shoulder. Decreased internal rotation. Nontender over the [facility] Tender in the anterior subacromial region. Inspection of the shoulder revealed no ecchymosis, soft tissue swelling, or deformity. No sulcus sign. Negative Speed test. Negative lift-off. Sensory exam was intact and motor function was normal in the deltoid and the rotator cuff.  Neurological: She is alert and oriented to person, place, and time. She has  normal reflexes.  Skin: Skin is warm and dry.  Psychiatric: She has a normal mood and affect.     Assessment/Plan R shoulder pain, impingement Persistent right rotator cuff arthropathy, temporary relief with subacromial corticosteroid injection. Severe tendinosis. No evidence of tear. Refractory to rest, activity modification, therapy, and injections.  Proceed with shoulder arthroscopy on the right. I had a long discussion with the patient concerning risks and benefits  of shoulder arthroscopy including no change, worsening in symptoms, need for manipulation of the extremity, need for open rotator cuff repair. Also discussed infection, DVT, PE, anesthetic complications, etc. Also included was the possibility of requirement for a repeat debridement in the future. Perioperative course was discussed in detail as well as time for recovery. An illustrated handout was provided and discussed in detail. Follow up in 2 weeks. Return to physical therapy and return her to work in 4-6 weeks.  Plan R shoulder arthroscopy, SAD, debridement  Lockie ParesJaclyn M. Bissell PA-C for Dr. Shelle IronBeane 04/28/2014, 8:35 PM

## 2014-04-30 NOTE — Anesthesia Procedure Notes (Signed)
Procedure Name: Intubation Date/Time: 04/30/2014 1:21 PM Performed by: Edison Pace Pre-anesthesia Checklist: Patient identified, Timeout performed, Emergency Drugs available, Suction available and Patient being monitored Patient Re-evaluated:Patient Re-evaluated prior to inductionOxygen Delivery Method: Circle system utilized Preoxygenation: Pre-oxygenation with 100% oxygen Intubation Type: IV induction and Cricoid Pressure applied Ventilation: Mask ventilation without difficulty Laryngoscope Size: Mac and 4 Grade View: Grade II Tube type: Oral Tube size: 7.5 mm Number of attempts: 1 Airway Equipment and Method: Stylet Placement Confirmation: ETT inserted through vocal cords under direct vision,  breath sounds checked- equal and bilateral and positive ETCO2 (with head lift/cricoid pressure ) Secured at: 21 cm Tube secured with: Tape Dental Injury: Teeth and Oropharynx as per pre-operative assessment  Difficulty Due To: Difficulty was anticipated, Difficult Airway- due to large tongue, Difficult Airway- due to reduced neck mobility, Difficult Airway- due to dentition, Difficult Airway- due to limited oral opening and Difficult Airway- due to anterior larynx Future Recommendations: Recommend- induction with short-acting agent, and alternative techniques readily available

## 2014-04-30 NOTE — Transfer of Care (Signed)
Immediate Anesthesia Transfer of Care Note  Patient: Sharon Gregory  Procedure(s) Performed: Procedure(s): RIGHT SHOULDER ARTHROSCOPY WITH SUBACROMIAL DECOMPRESSION AND DEBRIDEMENT (Right)  Patient Location: PACU  Anesthesia Type:General  Level of Consciousness: awake, alert , oriented, patient cooperative and responds to stimulation  Airway & Oxygen Therapy: Patient Spontanous Breathing and Patient connected to face mask oxygen  Post-op Assessment: Report given to PACU RN, Post -op Vital signs reviewed and stable and Patient moving all extremities  Post vital signs: Reviewed and stable  Complications: No apparent anesthesia complications

## 2014-04-30 NOTE — Brief Op Note (Signed)
04/30/2014  2:29 PM  PATIENT:  Sharon Gregory  33 y.o. female  PRE-OPERATIVE DIAGNOSIS:  IMPINGEMENT SNYDROME RIGHT SHOULDER  POST-OPERATIVE DIAGNOSIS:  impingement sndrome right shoulder  PROCEDURE:  Procedure(s): RIGHT SHOULDER ARTHROSCOPY WITH SUBACROMIAL DECOMPRESSION AND DEBRIDEMENT (Right)  SURGEON:  Surgeon(s) and Role:    * Javier Docker, MD - Primary  PHYSICIAN ASSISTANT:   ASSISTANTS: Bissell   ANESTHESIA:   general  EBL:  Total I/O In: 1000 [I.V.:1000] Out: -   BLOOD ADMINISTERED:none  DRAINS: none   LOCAL MEDICATIONS USED:  MARCAINE     SPECIMEN:  No Specimen  DISPOSITION OF SPECIMEN:  N/A  COUNTS:  YES  TOURNIQUET:  * No tourniquets in log *  DICTATION: .Other Dictation: Dictation Number [id number]  PLAN OF CARE: Admit for overnight observation  PATIENT DISPOSITION:  PACU - hemodynamically stable.   Delay start of Pharmacological VTE agent (>24hrs) due to surgical blood loss or risk of bleeding: no

## 2014-05-01 LAB — CBC
HEMATOCRIT: 34.5 % — AB (ref 36.0–46.0)
HEMOGLOBIN: 11.4 g/dL — AB (ref 12.0–15.0)
MCH: 28.2 pg (ref 26.0–34.0)
MCHC: 33 g/dL (ref 30.0–36.0)
MCV: 85.4 fL (ref 78.0–100.0)
Platelets: 284 10*3/uL (ref 150–400)
RBC: 4.04 MIL/uL (ref 3.87–5.11)
RDW: 13.5 % (ref 11.5–15.5)
WBC: 10.9 10*3/uL — AB (ref 4.0–10.5)

## 2014-05-01 LAB — BASIC METABOLIC PANEL
BUN: 11 mg/dL (ref 6–23)
CHLORIDE: 102 meq/L (ref 96–112)
CO2: 23 mEq/L (ref 19–32)
Calcium: 9.2 mg/dL (ref 8.4–10.5)
Creatinine, Ser: 0.68 mg/dL (ref 0.50–1.10)
GFR calc non Af Amer: >90 mL/min (ref >90)
Glucose, Bld: 144 mg/dL — ABNORMAL HIGH (ref 70–99)
Potassium: 4.5 mEq/L (ref 3.7–5.3)
SODIUM: 139 meq/L (ref 137–147)

## 2014-05-01 LAB — GLUCOSE, CAPILLARY: GLUCOSE-CAPILLARY: 123 mg/dL — AB (ref 70–99)

## 2014-05-01 MED ORDER — METHOCARBAMOL 500 MG PO TABS: 500.0000 mg | ORAL_TABLET | Freq: Four times a day (QID) | ORAL | Status: DC | PRN

## 2014-05-01 MED ORDER — OXYCODONE-ACETAMINOPHEN 7.5-325 MG PO TABS: 1.0000 | ORAL_TABLET | ORAL | Status: DC | PRN

## 2014-05-01 NOTE — Progress Notes (Signed)
D/C instructions reviewed w/ pt. All questions answered, no further questions. Pt d/c in w/c in stable condition by NT to husband's car. Pt in possession of d/c instructions, scripts, and all personal belongings.

## 2014-05-01 NOTE — Progress Notes (Signed)
05/01/14 1000  PT Visit Information  Last PT Received On 05/01/14  Reason Eval/Treat Not Completed PT screened, no needs identified, will sign off (per OT)

## 2014-05-01 NOTE — Discharge Instructions (Signed)
SHOULDER ARTHROSCOPY POSTOPERATIVE INSTRUCTIONS FOR DR. Shelle Iron  1.  Ice pack on shoulder 3-4 times per day.  2.  May remove bandages in 72 hours and apply band-aids to sutures.  3,  May get out of sling in AM and start gentle pendulum exercises.  You are encouraged       to move the elbow, wrist and hand.  4.  Elevate operative shoulder and elbow on pillow.  5.  Exercise your fingers to help reduce swelling.  6.  Report to your doctor should any of the following situations occur:   -Swelling of your fingers.  -Inability to wiggle your fingers.  -Coldness, turning pale or blueness of your fingers.  -Loss of sensation, numbness or tingling of your fingers.  -Unusual small or odor from under dressing.  -Excessive bleeding or drainage from the surgical site(s).  -Severe pain which is not relieved by the pain medication your doctor prescribed                for you.  7.  Call the office to schedule and appointment for 2 weeks . 8.  Take one aspirin per day 325mg  with a meal if not on another blood thinner or allergic.  Patient Signature:  ________________________________________________________  Nurse's Signature:  ________________________________________________________  Use CPAP machine at home
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
## 2014-05-01 NOTE — Evaluation (Signed)
Occupational Therapy Evaluation Patient Details Name: Sharon Gregory MRN: [id number] DOB: 1981/11/06 Today's Date: 04-May-2014    History of Present Illness RIGHT SHOULDER ARTHROSCOPY WITH SUBACROMIAL DECOMPRESSION AND DEBRIDEMENT    Clinical Impression   Pt presents to OT s/p shoulder surgery per Dr Shelle Iron.  OT education complete and handout provided. Pt did well!     Follow Up Recommendations  Outpatient OT    Equipment Recommendations  None recommended by OT       Precautions / Restrictions Precautions Precautions: Shoulder Shoulder Interventions: Shoulder sling/immobilizer;Off for dressing/bathing/exercises Restrictions Weight Bearing Restrictions: Yes RUE Weight Bearing: Non weight bearing                                    Cognition Arousal/Alertness: Awake/alert Behavior During Therapy: WFL for tasks assessed/performed Overall Cognitive Status: Within Functional Limits for tasks assessed                           Shoulder Instructions Shoulder Instructions Donning/doffing shirt without moving shoulder: Min-guard;Patient able to independently direct caregiver Method for sponge bathing under operated UE: Min-guard;Patient able to independently direct caregiver Donning/doffing sling/immobilizer: Min-guard;Patient able to independently direct caregiver Correct positioning of sling/immobilizer: Min-guard;Patient able to independently direct caregiver Pendulum exercises (written home exercise program): Min-guard;Patient able to independently direct caregiver ROM for elbow, wrist and digits of operated UE: Min-guard;Patient able to independently direct caregiver Sling wearing schedule (on at all times/off for ADL's): Min-guard;Patient able to independently direct caregiver Proper positioning of operated UE when showering: Min-guard;Patient able to independently direct caregiver Positioning of UE while sleeping: Min-guard;Patient able to  independently direct caregiver    Home Living Family/patient expects to be discharged to:: Private residence Living Arrangements: Spouse/significant other                                                    OT Goals(Current goals can be found in the care plan section) Acute Rehab OT Goals Patient Stated Goal: get back to sleeping in own bed OT Goal Formulation: With patient   End of Session Nurse Communication: Mobility status  Activity Tolerance: Patient tolerated treatment well Patient left: in bed   Time: 7017-7939 OT Time Calculation (min): 34 min Charges:  OT General Charges $OT Visit: 1 Procedure OT Evaluation $Initial OT Evaluation Tier I: 1 Procedure OT Treatments $Self Care/Home Management : 23-37 mins G-Codes:    Alba Cory May 04, 2014, 9:49 AM

## 2014-05-01 NOTE — Op Note (Signed)
NAMEFATHIMA, ORD              ACCOUNT NO.:  [id number]  MEDICAL RECORD NO.:  [id number]  LOCATION:  1611                         FACILITY:  [facility]  PHYSICIAN:  Jene Every, M.D.    DATE OF BIRTH:  [birth year]  DATE OF PROCEDURE:  04/30/2014 DATE OF DISCHARGE:                              OPERATIVE REPORT   PREOPERATIVE DIAGNOSIS:  Impingement syndrome of the right shoulder.  POSTOPERATIVE DIAGNOSIS:  Impingement syndrome of the right shoulder.  PROCEDURE PERFORMED: 1. Right shoulder arthroscopy. 2. Subacromial decompression, acromioplasty, and debridement of the     bursa.  ANESTHESIA:  General.  ASSISTANT:  Lanna Poche, PA.  BRIEF HISTORY:  A 33 year old, refractory shoulder pain, secondary to impingement syndrome and contusion.  MRI indicating no tear, but rotator cuff arthropathy, impingement.  She also had associated neck pain.  This was differentiated from that.  We discussed arthroscopic subacromial decompression.  Risk and benefits discussed including bleeding, infection, damage to neurovascular structures, DVT, PE, anesthetic complications, etc.  TECHNIQUE:  With the patient supine in a beach-chair position, after the induction of adequate general anesthesia, 2 g Kefzol, the right upper shoulder and upper extremity was prepped and draped in usual sterile fashion.  She had full range of motion.  Surgical marker utilized to delineate the acromion, AC joint, and coracoid.  The patient had a significantly elevated BMI and the contours of the acromion was somewhat obscured.  We however used a standard posterolateral and anterolateral portals with an incision through the skin only with the arm.  We advanced the arthroscopic camera in the subacromial space triangulating with the anterolateral portal.  Irrigant was utilized to insufflate the joint at 65 mmHg.  Hypertrophic synovitis and bursa was noted extensively and introduced a 3.5 shaver and performed a  full bursectomy anteriorly and posterolaterally to the musculotendinous junction.  There was no tear in the cuff.  It was hyperemic.  There was ample space in the subacromial region.  No significant spurring requiring bony resection of the acromion and a good acromial humeral distance.  I did not feel CA ligament resection was necessary.  The cuff was pristine without evidence of tearing.  After full bursectomy, inspection of the cuff, we then redirected the camera, made an attempt to enter the glenohumeral joint.  With the arm in the 70/30 position, gentle traction applied.  We advanced the camera to the glenohumeral joint.  She had some tightness posteriorly with her bony landmarks obscured.  With the tightness, we decided to forego the glenohumeral joint as there was no pathology noted there.  I therefore removed all instrumentation and portals were closed with 4-0 nylon simple sutures.  Copious amount of Marcaine was infiltrated in the joint.  Wound was dressed sterilely.  Placed in a sling, extubated without difficulty, and transported to the recovery room in a satisfactory condition.  The patient tolerated the procedure well.  No complications.  Assistant, Lanna Poche, PA was used to help place the arm in traction, monitoring the inflow and outflow of the fluid, and patient positioning.     Jene Every, M.D.     Cordelia Pen  D:  04/30/2014  T:  05/01/2014  Job:  [id number] 

## 2014-05-02 NOTE — Progress Notes (Signed)
CARE MANAGEMENT NOTE 05/02/2014  Patient:  Sharon Gregory, Sharon Gregory   Account Number:  [id number]  Date Initiated:  05/02/2014  Documentation initiated by:  [facility]  Subjective/Objective Assessment:   RIGHT SHOULDER ARTHROSCOPY WITH SUBACROMIAL DECOMPRESSION AND DEBRIDEMENT     Action/Plan:   No NCM needs identified.   Anticipated DC Date:  05/01/2014   Anticipated DC Plan:  HOME/SELF CARE      DC Planning Services  CM consult      Choice offered to / List presented to:             Status of service:  Completed, signed off Medicare Important Message given?   (If response is "NO", the following Medicare IM given date fields will be blank) Date Medicare IM given:   Date Additional Medicare IM given:    Discharge Disposition:  HOME/SELF CARE  Per UR Regulation:    If discussed at Long Length of Stay Meetings, dates discussed:    Comments:
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
## 2014-05-04 ENCOUNTER — Encounter (HOSPITAL_COMMUNITY): Payer: Self-pay | Admitting: Specialist

## 2014-06-29 ENCOUNTER — Encounter: Payer: Self-pay | Admitting: Nurse Practitioner

## 2014-06-29 ENCOUNTER — Ambulatory Visit (INDEPENDENT_AMBULATORY_CARE_PROVIDER_SITE_OTHER): Payer: PRIVATE HEALTH INSURANCE | Admitting: Nurse Practitioner

## 2014-06-29 VITALS — BP 122/84 | HR 88 | Ht 64.0 in | Wt 282.0 lb

## 2014-06-29 DIAGNOSIS — F0781 Postconcussional syndrome: Secondary | ICD-10-CM

## 2014-06-29 DIAGNOSIS — G44329 Chronic post-traumatic headache, not intractable: Secondary | ICD-10-CM

## 2014-06-29 MED ORDER — TRAMADOL HCL 50 MG PO TABS: 100.0000 mg | ORAL_TABLET | Freq: Four times a day (QID) | ORAL | Status: DC | PRN

## 2014-06-29 MED ORDER — TRAZODONE HCL 50 MG PO TABS: 50.0000 mg | ORAL_TABLET | Freq: Every day | ORAL | Status: DC

## 2014-06-29 NOTE — Patient Instructions (Addendum)
Discontinue amitriptyline, as this has not been of benefit to you.  Start Trazodone 25 mg daily at bedtime, after 1 week increase to 50 mg.   Tramadol 100 mg every 6 hours as needed for headache pain.  Follow up in 2 months, sooner as needed.  Call with any problems or questions.   Post-Concussion Syndrome Post-concussion syndrome describes the symptoms that can occur after a head injury. These symptoms can last from weeks to months. CAUSES  It is not clear why some head injuries cause post-concussion syndrome. It can occur whether your head injury was mild or severe and whether you were wearing head protection or not.  SIGNS AND SYMPTOMS  Memory difficulties.  Dizziness.  Headaches.  Double vision or blurry vision.  Sensitivity to light.  Hearing difficulties.  Depression.  Tiredness.  Weakness.  Difficulty with concentration.  Difficulty sleeping or staying asleep.  Vomiting.  Poor balance or instability on your feet.  Slow reaction time.  Difficulty learning and remembering things you have heard. DIAGNOSIS  There is no test to determine whether you have post-concussion syndrome. Your health care provider may order an imaging scan of your brain, such as a CT scan, to check for other problems that may be causing your symptoms (such as severe injury inside your skull). TREATMENT  Usually, these problems disappear over time without medical care. Your health care provider may prescribe medicine to help ease your symptoms. It is important to follow up with a neurologist to evaluate your recovery and address any lingering symptoms or issues. HOME CARE INSTRUCTIONS   Only take over-the-counter or prescription medicines for pain, discomfort, or fever as directed by your health care provider. Do not take aspirin. Aspirin can slow blood clotting.  Sleep with your head slightly elevated to help with headaches.  Avoid any situation where there is potential for another head  injury (football, hockey, soccer, basketball, martial arts, downhill snow sports, and horseback riding). Your condition will get worse every time you experience a concussion. You should avoid these activities until you are evaluated by the appropriate follow-up health care providers.  Keep all follow-up appointments as directed by your health care provider. SEEK IMMEDIATE MEDICAL CARE IF:  You develop confusion or unusual drowsiness.  You cannot wake the injured person.  You develop nausea or persistent, forceful vomiting.  You feel like you are moving when you are not (vertigo).  You notice the injured person's eyes moving rapidly back and forth. This may be a sign of vertigo.  You have convulsions or faint.  You have severe, persistent headaches that are not relieved by medicine.  You cannot use your arms or legs normally.  Your pupils change size.  You have clear or bloody discharge from the nose or ears.  Your problems are getting worse, not better. MAKE SURE YOU:  Understand these instructions.  Will watch your condition.  Will get help right away if you are not doing well or get worse. Document Released: 05/18/2002 Document Revised: 09/16/2013 Document Reviewed: 06/14/2011 [facility] Patient Information 2015 [place], [place]. This information is not intended to replace advice given to you by your health care provider. Make sure you discuss any questions you have with your health care provider.
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
## 2014-07-02 NOTE — Progress Notes (Signed)
I reviewed note and agree with plan.   Hilja Kintzel R. Kolbi Altadonna, MD  Certified in Neurology, Neurophysiology and Neuroimaging  [facility] [address], [address] [place], [place] [address] [phone]   

## 2014-07-13 ENCOUNTER — Ambulatory Visit: Payer: Self-pay | Admitting: Orthopedic Surgery
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
## 2014-07-15 NOTE — Telephone Encounter (Signed)
Noted  

## 2014-07-21 ENCOUNTER — Encounter (HOSPITAL_COMMUNITY): Payer: Self-pay | Admitting: Pharmacy Technician

## 2014-07-23 NOTE — Patient Instructions (Addendum)
20     Your procedure is scheduled on:  Friday 07/30/2014  [facility] and follow signs to Short Stay  at 125 PM.  Call this number if you have problems the night before or morning of surgery:  [phone]   Remember:             IF YOU USE CPAP,BRING MASK AND TUBING AM OF SURGERY!             IF YOU DO NOT HAVE YOUR TYPE AND SCREEN DRAWN AT PRE-ADMIT APPOINTMENT, YOU WILL HAVE IT DRAWN AM OF SURGERY!   Do not eat food  AFTER MIDNIGHT!MAY HAVE CLEAR LIQUIDS FROM MIDNIGHT UP UNTIL 0925 AM MORNING OF SURGERY THEN NOTHING UNTIL AFTER SURGERY!  Take these medicines the morning of surgery with A SIP OF WATER: NEXIUM, USE ALBUTEROL INHALER IF NEEDED    [facility] IS NOT RESPONSIBLE FOR ANY BELONGINGS OR VALUABLES BROUGHT TO HOSPITAL.  Marland Kitchen.  Leave suitcase in the car. After surgery it may be brought to your room.  For patients admitted to the hospital, checkout time is 11:00 AM the day of              Discharge.    DO NOT WEAR  JEWELRY,MAKE-UP,LOTIONS,POWDERS,PERFUMES,CONTACTS , DENTURES OR BRIDGEWORK ,AND DO NOT WEAR FALSE EYELASHES                                    Patients discharged the day of surgery will not be allowed to drive home. If going home the same day of surgery, must have someone stay with you  first 24 hrs.at home and arrange for someone to drive you home from the              Hospital.              YOUR DRIVER EX:BM[facility]UXLS:[facility] [place]   Special Instructions:              Please read over the following fact sheets that you were given:             1. [place] PREPARING FOR SURGERY SHEET              2.[facility]                                        [place] M.Cathlean Saueranney,RN,BSN     [phone]                              [facility] - Preparing for [facility] Before surgery, you can play an important role.  Because skin is not sterile, your skin needs to be as free of germs as possible.  You can reduce the number of germs on your skin by  washing with CHG (chlorahexidine gluconate) soap before surgery.  CHG is an antiseptic cleaner which kills germs and bonds with the skin to continue killing germs even after washing. Please DO NOT use if you have an allergy to CHG or antibacterial soaps.  If your skin becomes reddened/irritated stop using the CHG and inform your nurse when you arrive at Short Stay. Do not shave (including legs and underarms) for at least 48 hours prior to the first CHG  shower.  You may shave your face/neck. Please follow these instructions carefully:  1.  Shower with CHG Soap the night before surgery and the  morning of Surgery.  2.  If you choose to wash your hair, wash your hair first as usual with your  normal  shampoo.  3.  After you shampoo, rinse your hair and body thoroughly to remove the  shampoo.                           4.  Use CHG as you would any other liquid soap.  You can apply chg directly  to the skin and wash                       Gently with a scrungie or clean washcloth.  5.  Apply the CHG Soap to your body ONLY FROM THE NECK DOWN.   Do not use on face/ open                           Wound or open sores. Avoid contact with eyes, ears mouth and genitals (private parts).                       Wash face,  Genitals (private parts) with your normal soap.             6.  Wash thoroughly, paying special attention to the area where your surgery  will be performed.  7.  Thoroughly rinse your body with warm water from the neck down.  8.  DO NOT shower/wash with your normal soap after using and rinsing off  the CHG Soap.                9.  Pat yourself dry with a clean towel.            10.  Wear clean pajamas.            11.  Place clean sheets on your bed the night of your first shower and do not  sleep with pets. Day of Surgery : Do not apply any lotions/deodorants the morning of surgery.  Please wear clean clothes to the hospital/surgery center.  FAILURE TO FOLLOW THESE INSTRUCTIONS MAY RESULT IN THE  CANCELLATION OF YOUR SURGERY PATIENT SIGNATURE_________________________________  NURSE SIGNATURE__________________________________  ________________________________________________________________________   Sharon Gregory  An incentive spirometer is a tool that can help keep your lungs clear and active. This tool measures how well you are filling your lungs with each breath. Taking long deep breaths may help reverse or decrease the chance of developing breathing (pulmonary) problems (especially infection) following:  A long period of time when you are unable to move or be active. BEFORE THE PROCEDURE   If the spirometer includes an indicator to show your best effort, your nurse or respiratory therapist will set it to a desired goal.  If possible, sit up straight or lean slightly forward. Try not to slouch.  Hold the incentive spirometer in an upright position. INSTRUCTIONS FOR USE  1. Sit on the edge of your bed if possible, or sit up as far as you can in bed or on a chair. 2. Hold the incentive spirometer in an upright position. 3. Breathe out normally. 4. Place the mouthpiece in your mouth and seal your lips tightly around it. 5. Breathe in slowly and as deeply as  possible, raising the piston or the ball toward the top of the column. 6. Hold your breath for 3-5 seconds or for as long as possible. Allow the piston or ball to fall to the bottom of the column. 7. Remove the mouthpiece from your mouth and breathe out normally. 8. Rest for a few seconds and repeat Steps 1 through 7 at least 10 times every 1-2 hours when you are awake. Take your time and take a few normal breaths between deep breaths. 9. The spirometer may include an indicator to show your best effort. Use the indicator as a goal to work toward during each repetition. 10. After each set of 10 deep breaths, practice coughing to be sure your lungs are clear. If you have an incision (the cut made at the time of surgery),  support your incision when coughing by placing a pillow or rolled up towels firmly against it. Once you are able to get out of bed, walk around indoors and cough well. You may stop using the incentive spirometer when instructed by your caregiver.  RISKS AND COMPLICATIONS  Take your time so you do not get dizzy or light-headed.  If you are in pain, you may need to take or ask for pain medication before doing incentive spirometry. It is harder to take a deep breath if you are having pain. AFTER USE  Rest and breathe slowly and easily.  It can be helpful to keep track of a log of your progress. Your caregiver can provide you with a simple table to help with this. If you are using the spirometer at home, follow these instructions: SEEK MEDICAL CARE IF:   You are having difficultly using the spirometer.  You have trouble using the spirometer as often as instructed.  Your pain medication is not giving enough relief while using the spirometer.  You develop fever of 100.5 F (38.1 C) or higher. SEEK IMMEDIATE MEDICAL CARE IF:   You cough up bloody sputum that had not been present before.  You develop fever of 102 F (38.9 C) or greater.  You develop worsening pain at or near the incision site. MAKE SURE YOU:   Understand these instructions.  Will watch your condition.  Will get help right away if you are not doing well or get worse. Document Released: 04/08/2007 Document Revised: 02/18/2012 Document Reviewed: 06/09/2007 [facility] Patient Information 2014 [place], [place].   ________________________________________________________________________

## 2014-07-26 ENCOUNTER — Encounter (HOSPITAL_COMMUNITY): Payer: Self-pay

## 2014-07-26 ENCOUNTER — Encounter (HOSPITAL_COMMUNITY)
Admission: RE | Admit: 2014-07-26 | Discharge: 2014-07-26 | Disposition: A | Discharge status: Home / Self Care | Payer: Worker's Compensation | Source: Ambulatory Visit | Attending: Specialist | Admitting: Specialist

## 2014-07-26 DIAGNOSIS — M2240 Chondromalacia patellae, unspecified knee: Secondary | ICD-10-CM | POA: Insufficient documentation

## 2014-07-26 DIAGNOSIS — IMO0001 Reserved for inherently not codable concepts without codable children: Secondary | ICD-10-CM | POA: Insufficient documentation

## 2014-07-26 DIAGNOSIS — Z01818 Encounter for other preprocedural examination: Secondary | ICD-10-CM | POA: Insufficient documentation

## 2014-07-26 DIAGNOSIS — M2340 Loose body in knee, unspecified knee: Secondary | ICD-10-CM | POA: Insufficient documentation

## 2014-07-26 DIAGNOSIS — M6580 Other synovitis and tenosynovitis, unspecified site: Secondary | ICD-10-CM | POA: Insufficient documentation

## 2014-07-26 DIAGNOSIS — X58XXXA Exposure to other specified factors, initial encounter: Secondary | ICD-10-CM | POA: Insufficient documentation

## 2014-07-26 LAB — BASIC METABOLIC PANEL
ANION GAP: 14 (ref 5–15)
BUN: 10 mg/dL (ref 6–23)
CHLORIDE: 100 meq/L (ref 96–112)
CO2: 25 mEq/L (ref 19–32)
Calcium: 9.9 mg/dL (ref 8.4–10.5)
Creatinine, Ser: 0.75 mg/dL (ref 0.50–1.10)
GFR calc non Af Amer: >90 mL/min (ref >90)
Glucose, Bld: 144 mg/dL — ABNORMAL HIGH (ref 70–99)
Potassium: 3.8 mEq/L (ref 3.7–5.3)
SODIUM: 139 meq/L (ref 137–147)

## 2014-07-26 LAB — CBC
HCT: 38.3 % (ref 36.0–46.0)
HEMOGLOBIN: 12.4 g/dL (ref 12.0–15.0)
MCH: 28 pg (ref 26.0–34.0)
MCHC: 32.4 g/dL (ref 30.0–36.0)
MCV: 86.5 fL (ref 78.0–100.0)
Platelets: 274 10*3/uL (ref 150–400)
RBC: 4.43 MIL/uL (ref 3.87–5.11)
RDW: 12.6 % (ref 11.5–15.5)
WBC: 7.9 10*3/uL (ref 4.0–10.5)

## 2014-07-29 MED ORDER — DEXTROSE 5 % IV SOLN
3.0000 g | INTRAVENOUS | Status: AC
  Administered 2014-07-30: 3 g via INTRAVENOUS
  Filled 2014-07-29 (×2): qty 3000

## 2014-07-30 ENCOUNTER — Encounter (HOSPITAL_COMMUNITY)
Admission: RE | Disposition: A | Discharge status: Home / Self Care | Payer: Self-pay | Source: Ambulatory Visit | Attending: Specialist

## 2014-07-30 ENCOUNTER — Encounter (HOSPITAL_COMMUNITY): Payer: Self-pay | Admitting: *Deleted

## 2014-07-30 ENCOUNTER — Encounter (HOSPITAL_COMMUNITY): Payer: Worker's Compensation | Admitting: Anesthesiology

## 2014-07-30 ENCOUNTER — Ambulatory Visit (HOSPITAL_COMMUNITY): Payer: Worker's Compensation | Admitting: Anesthesiology

## 2014-07-30 ENCOUNTER — Ambulatory Visit (HOSPITAL_COMMUNITY)
Admission: RE | Admit: 2014-07-30 | Discharge: 2014-07-30 | Disposition: A | Discharge status: Home / Self Care | Payer: Worker's Compensation | Source: Ambulatory Visit | Attending: Specialist | Admitting: Specialist

## 2014-07-30 DIAGNOSIS — Z794 Long term (current) use of insulin: Secondary | ICD-10-CM | POA: Insufficient documentation

## 2014-07-30 DIAGNOSIS — M1731 Unilateral post-traumatic osteoarthritis, right knee: Secondary | ICD-10-CM

## 2014-07-30 DIAGNOSIS — F411 Generalized anxiety disorder: Secondary | ICD-10-CM | POA: Insufficient documentation

## 2014-07-30 DIAGNOSIS — M2240 Chondromalacia patellae, unspecified knee: Secondary | ICD-10-CM | POA: Insufficient documentation

## 2014-07-30 DIAGNOSIS — G4730 Sleep apnea, unspecified: Secondary | ICD-10-CM | POA: Insufficient documentation

## 2014-07-30 DIAGNOSIS — F17200 Nicotine dependence, unspecified, uncomplicated: Secondary | ICD-10-CM | POA: Insufficient documentation

## 2014-07-30 DIAGNOSIS — M2340 Loose body in knee, unspecified knee: Secondary | ICD-10-CM | POA: Insufficient documentation

## 2014-07-30 DIAGNOSIS — J4489 Other specified chronic obstructive pulmonary disease: Secondary | ICD-10-CM | POA: Insufficient documentation

## 2014-07-30 DIAGNOSIS — M659 Unspecified synovitis and tenosynovitis, unspecified site: Secondary | ICD-10-CM | POA: Insufficient documentation

## 2014-07-30 DIAGNOSIS — Z91018 Allergy to other foods: Secondary | ICD-10-CM | POA: Insufficient documentation

## 2014-07-30 DIAGNOSIS — J449 Chronic obstructive pulmonary disease, unspecified: Secondary | ICD-10-CM | POA: Insufficient documentation

## 2014-07-30 DIAGNOSIS — E119 Type 2 diabetes mellitus without complications: Secondary | ICD-10-CM | POA: Insufficient documentation

## 2014-07-30 DIAGNOSIS — K219 Gastro-esophageal reflux disease without esophagitis: Secondary | ICD-10-CM | POA: Insufficient documentation

## 2014-07-30 HISTORY — PX: KNEE ARTHROSCOPY: SHX127

## 2014-07-30 LAB — GLUCOSE, CAPILLARY
GLUCOSE-CAPILLARY: 113 mg/dL — AB (ref 70–99)
Glucose-Capillary: 141 mg/dL — ABNORMAL HIGH (ref 70–99)

## 2014-07-30 SURGERY — ARTHROSCOPY, KNEE
Anesthesia: General | Site: Knee | Laterality: Right

## 2014-07-30 MED ORDER — MIDAZOLAM HCL 2 MG/2ML IJ SOLN
INTRAMUSCULAR | Status: AC
  Filled 2014-07-30: qty 2

## 2014-07-30 MED ORDER — BUPIVACAINE-EPINEPHRINE 0.5% -1:200000 IJ SOLN
INTRAMUSCULAR | Status: DC | PRN
  Administered 2014-07-30: 20 mL

## 2014-07-30 MED ORDER — FENTANYL CITRATE 0.05 MG/ML IJ SOLN
INTRAMUSCULAR | Status: AC
  Filled 2014-07-30: qty 5

## 2014-07-30 MED ORDER — PROPOFOL 10 MG/ML IV BOLUS
INTRAVENOUS | Status: AC
  Filled 2014-07-30: qty 20

## 2014-07-30 MED ORDER — DEXAMETHASONE SODIUM PHOSPHATE 10 MG/ML IJ SOLN
INTRAMUSCULAR | Status: DC | PRN
  Administered 2014-07-30: 10 mg via INTRAVENOUS

## 2014-07-30 MED ORDER — SUCCINYLCHOLINE CHLORIDE 20 MG/ML IJ SOLN
INTRAMUSCULAR | Status: DC | PRN
  Administered 2014-07-30: 100 mg via INTRAVENOUS

## 2014-07-30 MED ORDER — ONDANSETRON HCL 4 MG/2ML IJ SOLN
INTRAMUSCULAR | Status: AC
  Filled 2014-07-30: qty 2

## 2014-07-30 MED ORDER — HYDROMORPHONE HCL PF 1 MG/ML IJ SOLN
0.2500 mg | INTRAMUSCULAR | Status: DC | PRN
  Administered 2014-07-30 (×4): 0.5 mg via INTRAVENOUS

## 2014-07-30 MED ORDER — METHOCARBAMOL 500 MG PO TABS: 500.0000 mg | ORAL_TABLET | Freq: Three times a day (TID) | ORAL | Status: DC

## 2014-07-30 MED ORDER — OXYCODONE HCL 5 MG PO TABS
ORAL_TABLET | ORAL | Status: AC
  Administered 2014-07-30: 2.5 mg via ORAL
  Filled 2014-07-30: qty 1

## 2014-07-30 MED ORDER — PROMETHAZINE HCL 25 MG/ML IJ SOLN
INTRAMUSCULAR | Status: AC
  Filled 2014-07-30: qty 1

## 2014-07-30 MED ORDER — FENTANYL CITRATE 0.05 MG/ML IJ SOLN
INTRAMUSCULAR | Status: DC | PRN
  Administered 2014-07-30 (×3): 50 ug via INTRAVENOUS

## 2014-07-30 MED ORDER — EPINEPHRINE HCL 1 MG/ML IJ SOLN
INTRAMUSCULAR | Status: DC | PRN
  Administered 2014-07-30: 2 mg

## 2014-07-30 MED ORDER — ONDANSETRON HCL 4 MG/2ML IJ SOLN
INTRAMUSCULAR | Status: DC | PRN
  Administered 2014-07-30: 4 mg via INTRAVENOUS

## 2014-07-30 MED ORDER — LACTATED RINGERS IV SOLN
INTRAVENOUS | Status: DC
  Administered 2014-07-30: 15:00:00 via INTRAVENOUS

## 2014-07-30 MED ORDER — CISATRACURIUM BESYLATE 20 MG/10ML IV SOLN
INTRAVENOUS | Status: AC
  Filled 2014-07-30: qty 10

## 2014-07-30 MED ORDER — KETOROLAC TROMETHAMINE 15 MG/ML IJ SOLN
INTRAMUSCULAR | Status: AC
  Filled 2014-07-30: qty 1

## 2014-07-30 MED ORDER — HYDROMORPHONE HCL PF 1 MG/ML IJ SOLN
INTRAMUSCULAR | Status: AC
  Filled 2014-07-30: qty 1

## 2014-07-30 MED ORDER — MIDAZOLAM HCL 5 MG/5ML IJ SOLN
INTRAMUSCULAR | Status: DC | PRN
  Administered 2014-07-30: 2 mg via INTRAVENOUS

## 2014-07-30 MED ORDER — OXYCODONE-ACETAMINOPHEN 5-325 MG PO TABS
ORAL_TABLET | ORAL | Status: AC
  Filled 2014-07-30: qty 1

## 2014-07-30 MED ORDER — METHOCARBAMOL 500 MG PO TABS
ORAL_TABLET | ORAL | Status: AC
  Administered 2014-07-30: 500 mg via ORAL
  Filled 2014-07-30: qty 1

## 2014-07-30 MED ORDER — PROPOFOL 10 MG/ML IV BOLUS
INTRAVENOUS | Status: DC | PRN
  Administered 2014-07-30: 50 mg via INTRAVENOUS
  Administered 2014-07-30: 150 mg via INTRAVENOUS

## 2014-07-30 MED ORDER — DEXAMETHASONE SODIUM PHOSPHATE 10 MG/ML IJ SOLN
INTRAMUSCULAR | Status: AC
  Filled 2014-07-30: qty 1

## 2014-07-30 MED ORDER — PROMETHAZINE HCL 25 MG/ML IJ SOLN
6.2500 mg | INTRAMUSCULAR | Status: AC | PRN
  Administered 2014-07-30: 12.5 mg via INTRAVENOUS
  Administered 2014-07-30: 6.25 mg via INTRAVENOUS

## 2014-07-30 MED ORDER — LACTATED RINGERS IR SOLN
Status: DC | PRN
  Administered 2014-07-30: 6000 mL

## 2014-07-30 MED ORDER — KETOROLAC TROMETHAMINE 30 MG/ML IJ SOLN
15.0000 mg | Freq: Once | INTRAMUSCULAR | Status: AC | PRN
  Administered 2014-07-30: 30 mg via INTRAVENOUS

## 2014-07-30 MED ORDER — OXYCODONE HCL 5 MG PO TABS
2.5000 mg | ORAL_TABLET | Freq: Once | ORAL | Status: AC
  Administered 2014-07-30: 2.5 mg via ORAL

## 2014-07-30 MED ORDER — METHOCARBAMOL 500 MG PO TABS
500.0000 mg | ORAL_TABLET | Freq: Once | ORAL | Status: AC
  Administered 2014-07-30: 500 mg via ORAL

## 2014-07-30 MED ORDER — BUPIVACAINE-EPINEPHRINE 0.5% -1:200000 IJ SOLN
INTRAMUSCULAR | Status: AC
  Filled 2014-07-30: qty 1

## 2014-07-30 MED ORDER — OXYCODONE-ACETAMINOPHEN 7.5-325 MG PO TABS: 1.0000 | ORAL_TABLET | ORAL | Status: DC | PRN

## 2014-07-30 MED ORDER — OXYCODONE-ACETAMINOPHEN 5-325 MG PO TABS: 1.0000 | ORAL_TABLET | ORAL | Status: DC | PRN

## 2014-07-30 MED ORDER — KETOROLAC TROMETHAMINE 30 MG/ML IJ SOLN
INTRAMUSCULAR | Status: AC
  Filled 2014-07-30: qty 1

## 2014-07-30 MED ORDER — EPINEPHRINE HCL 1 MG/ML IJ SOLN
INTRAMUSCULAR | Status: AC
  Filled 2014-07-30: qty 2

## 2014-07-30 SURGICAL SUPPLY — 20 items
BLADE 4.2CUDA (BLADE) IMPLANT
BLADE CUDA SHAVER 3.5 (BLADE) ×2 IMPLANT
CLOTH 2% CHLOROHEXIDINE 3PK (PERSONAL CARE ITEMS) ×2 IMPLANT
DRSG EMULSION OIL 3X3 NADH (GAUZE/BANDAGES/DRESSINGS) ×2 IMPLANT
DURAPREP 26ML APPLICATOR (WOUND CARE) ×2 IMPLANT
GLOVE BIO SURGEON STRL SZ 6.5 (GLOVE) ×2 IMPLANT
GLOVE BIOGEL PI IND STRL 7.5 (GLOVE) ×2 IMPLANT
GLOVE BIOGEL PI INDICATOR 7.5 (GLOVE) ×2
GLOVE SURG SS PI 7.0 STRL IVOR (GLOVE) ×2 IMPLANT
GLOVE SURG SS PI 7.5 STRL IVOR (GLOVE) ×2 IMPLANT
GLOVE SURG SS PI 8.0 STRL IVOR (GLOVE) ×4 IMPLANT
GOWN STRL REUS W/TWL XL LVL3 (GOWN DISPOSABLE) ×4 IMPLANT
MANIFOLD NEPTUNE II (INSTRUMENTS) ×2 IMPLANT
PACK ARTHROSCOPY WL (CUSTOM PROCEDURE TRAY) ×2 IMPLANT
PADDING CAST COTTON 6X4 STRL (CAST SUPPLIES) ×2 IMPLANT
SET ARTHROSCOPY TUBING (MISCELLANEOUS) ×1
SET ARTHROSCOPY TUBING LN (MISCELLANEOUS) ×1 IMPLANT
SUT ETHILON 4 0 PS 2 18 (SUTURE) ×2 IMPLANT
WAND 90 DEG TURBOVAC W/CORD (SURGICAL WAND) IMPLANT
WRAP KNEE MAXI GEL POST OP (GAUZE/BANDAGES/DRESSINGS) ×2 IMPLANT

## 2014-07-30 NOTE — Anesthesia Postprocedure Evaluation (Signed)
  Anesthesia Post-op Note  Patient: Sharon Gregory  Procedure(s) Performed: Procedure(s): ARTHROSCOPY RIGHT KNEE, Synovectomy,CHONDROPLASTY (Right)  Patient Location: PACU  Anesthesia Type:General  Level of Consciousness: awake, alert  and oriented  Airway and Oxygen Therapy: Patient Spontanous Breathing  Post-op Pain: mild  Post-op Assessment: Post-op Vital signs reviewed. Tender to palpation sternal chest pain relieved with toradol IV. Pt instructed to wear CPAP for the next two nights and pt expressed understanding, and stated she would do so.  Post-op Vital Signs: Reviewed  Last Vitals:  Filed Vitals:   07/30/14 1930  BP: 103/58  Pulse: 98  Temp:   Resp: 17    Complications: No apparent anesthesia complications

## 2014-07-30 NOTE — Progress Notes (Signed)
Writer comes to PACU in order to recover patient and no scripts on chart for robaxin or percocet. Called Dr Shelle IronBeane who came in to give Rx to patient.

## 2014-07-30 NOTE — Op Note (Signed)
NAMBeverly Gust:  Stargell, Shanikwa              ACCOUNT NO.:  [id number]  MEDICAL RECORD NO.:  [id number]  LOCATION:  [facility]                         [facility]  PHYSICIAN:  Jene EveryJeffrey Nivedita Mirabella, M.D.    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  POSTOPERATIVE DIAGNOSIS: 1. 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 2. Hypertrophic synovitis medial compartment. 3. Chondral loose bodies.  PROCEDURE PERFORMED: 1. Right knee arthroscopy. 2. Chondroplasty patellofemoral joint. 3. Synovectomy medial compartment, suprapatellar pouch.  ANESTHESIA:  General.  ASSISTANT:  None.  HISTORY:  This is a 33 year old involved in an injury striking her knee. She has had persistent patellofemoral pain, locking, popping, and given way.  She was indicated for diagnostic arthroscopy, debridement, synovectomy, evaluation of menisci, evacuation presumed loose bodies. She had chondromalacia noted on her MRI.  Risk and benefits were discussed including bleeding, infection, damage to neurovascular, DVT, PE, and anesthetic complications, etc.  TECHNIQUE:  The patient was placed in supine position, after induction of adequate general anesthesia, 3 g of Kefzol in the right lower extremity was prepped and draped usual sterile fashion.  A lateral parapatellar portal was fashioned with a #11 blade ingress cannula atraumatically placed.  Irrigant was utilized to insufflate the joint. Under direct visualization, medial parapatellar portal was fashioned with a #11 blade after localization with 18-gauge needle sparing the medial meniscus.  Noted in the medial compartment was hypertrophic synovitis.  I introduced a 3.5 shaver and performed a synovectomy. There was no tear in the meniscus, however, the chondral surfaces appeared  unremarkable.  Meniscus stable to probe palpation. Intercondylar notch was unremarkable with a normal ACL.  The lateral compartment revealed some synovitis as well.  Synovectomy performed here.  Meniscus was stable to probe palpation.  No chondral injuries were noted.  Suprapatellar pouch revealed some extensive grade 3 changes of the patella centrally, chondral flap tear, loose bodies, and suprapatellar region were very small area over chondral injury, and the trochlea medially.  We performed chondroplasties of both.  There was no grade 4 changes.  There was normal patellofemoral tracking.  We had evacuated the loose bodies.  Revisit all compartments.  No further pathology amenable to arthroscopic intervention.  I, therefore, removed all instrumentation.  Portals were closed with 4-0 nylon simple suture.  A 0.25% Marcaine with epinephrine was infiltrated in the joint.  Wound was dressed sterilely.  Awoken without difficulty and transported to the recovery room in satisfactory condition.  The patient tolerated the procedure well.  No complications.  No assistant.  Minimal blood loss.     Jene EveryJeffrey Devontay Celaya, M.D.     Cordelia PenJB/MEDQ  D:  07/30/2014  T:  07/30/2014  Job:  [id number]

## 2014-07-30 NOTE — Brief Op Note (Signed)
07/30/2014  5:39 PM  PATIENT:  Sharon Gregory  33 y.o. female  PRE-OPERATIVE DIAGNOSIS:  chondroflat Tear Right Knee with Loose Body  POST-OPERATIVE DIAGNOSIS:  chondroflat Tear Right Knee with Loose Body  PROCEDURE:  Procedure(s): ARTHROSCOPY RIGHT KNEE, Synovectomy,CHONDROPLASTY (Right)  SURGEON:  Surgeon(s) and Role:    * Javier DockerJeffrey C Nithya Meriweather, MD - Primary  PHYSICIAN ASSISTANT:   ASSISTANTS: none   ANESTHESIA:   general  EBL:  Total I/O In: 500 [I.V.:500] Out: -   BLOOD ADMINISTERED:none  DRAINS: none   LOCAL MEDICATIONS USED:  MARCAINE     SPECIMEN:  No Specimen  DISPOSITION OF SPECIMEN:  N/A  COUNTS:  YES  TOURNIQUET:  * No tourniquets in log *  DICTATION: .Other Dictation: Dictation Number [id number]  PLAN OF CARE: Discharge to home after PACU  PATIENT DISPOSITION:  PACU - hemodynamically stable.   Delay start of Pharmacological VTE agent (>24hrs) due to surgical blood loss or risk of bleeding: no
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
## 2014-07-30 NOTE — Interval H&P Note (Signed)
History and Physical Interval Note:  07/30/2014 4:09 PM  Sharon Gregory  has presented today for surgery, with the diagnosis of chondroflat Tear Right Knee with Loose Body  The various methods of treatment have been discussed with the patient and family. After consideration of risks, benefits and other options for treatment, the patient has consented to  Procedure(s): ARTHROSCOPY RIGHT KNEE WITH CHONDROPLASTY AND REMOVAL OF LOOSE BODY (Right) as a surgical intervention .  The patient's history has been reviewed, patient examined, no change in status, stable for surgery.  I have reviewed the patient's chart and labs.  Questions were answered to the patient's satisfaction.     Davionne Mastrangelo C

## 2014-07-30 NOTE — Anesthesia Preprocedure Evaluation (Addendum)
Anesthesia Evaluation  Patient identified by MRN, date of birth, ID band Patient awake    Reviewed: Allergy & Precautions, H&P , NPO status , Patient's Chart, lab work & pertinent test results  History of Anesthesia Complications (+) PONV and history of anesthetic complications  Airway Mallampati: III TM Distance: >3 FB Neck ROM: Full    Dental  (+) Teeth Intact, Dental Advisory Given   Pulmonary asthma , sleep apnea (Noncompliant with CPAP) , COPD COPD inhaler, Current Smoker,  breath sounds clear to auscultation        Cardiovascular negative cardio ROS  Rhythm:Regular Rate:Normal     Neuro/Psych  Headaches, PSYCHIATRIC DISORDERS Anxiety    GI/Hepatic GERD-  Medicated,  Endo/Other  diabetes, Poorly Controlled, Type 2, Insulin DependentMorbid obesity  Renal/GU   negative genitourinary   Musculoskeletal negative musculoskeletal ROS (+)   Abdominal   Peds  Hematology negative hematology ROS (+)   Anesthesia Other Findings   Reproductive/Obstetrics negative OB ROS                          Anesthesia Physical Anesthesia Plan  ASA: III  Anesthesia Plan: General   Post-op Pain Management:    Induction: Intravenous  Airway Management Planned: LMA  Additional Equipment: None  Intra-op Plan:   Post-operative Plan: Extubation in OR  Informed Consent: I have reviewed the patients History and Physical, chart, labs and discussed the procedure including the risks, benefits and alternatives for the proposed anesthesia with the patient or authorized representative who has indicated his/her understanding and acceptance.   Dental advisory given  Plan Discussed with: Surgeon and CRNA  Anesthesia Plan Comments:         Anesthesia Quick Evaluation

## 2014-07-30 NOTE — Discharge Instructions (Signed)
ARTHROSCOPIC KNEE SURGERY HOME CARE INSTRUCTIONS   PAIN You will be expected to have a moderate amount of pain in the affected knee for approximately two weeks.  However, the first two to four days will be the most severe in terms of the pain you will experience.  Prescriptions have been provided for you to take as needed for the pain.  The pain can be markedly reduced by using the ice/compressive bandage given.  Exchange the ice packs whenever they thaw.  During the night, keep the bandage on because it will still provide some compression for the swelling.  Also, keep the leg elevated on pillows above your heart, and this will help alleviate the pain and swelling.  MEDICATION Prescriptions have been provided to take as needed for pain. To prevent blood clots, take Aspirin 325mg  daily with a meal if not on a blood thinner and if no history of stomach ulcers.  ACTIVITY It is preferred that you stay on bedrest for approximately 24 hours.  However, you may go to the bathroom with help.  After this, you can start to be up and about progressively more.  Remember that the swelling may still increase after three to four days if you are up and doing too much.  You may put as much weight on the affected leg as pain will allow.  Use your crutches for comfort and safety.  However, as soon as you are able, you may discard the crutches and go without them.   DRESSING Keep the current dressing as dry as possible.  Two days after your surgery, you may remove the ice/compressive wrap, and surgical dressing.  You may now take a shower, but do not scrub the sounds directly with soap.  Let water rinse over these and gently wipe with your hand.  Reapply band-aids over the puncture wounds and more gauze if needed.  A slight amount of thin drainage can be normal at this time, and do not let it frighten you.  Reapply the ice/compressive wrap.  You may now repeat this every day each time you shower.  SYMPTOMS TO REPORT TO  YOUR DOCTOR  -Extreme pain.  -Extreme swelling.  -Temperature above 101 degrees that does not come down with acetaminophen     (Tylenol).  -Any changes in the feeling, color or movement of your toes.  -Extreme redness, heat, swelling or drainage at your incision  EXERCISE It is preferred that you begin to exercise on the day of your surgery.  Straight leg raises and short arc quads should be begun the afternoon or evening of surgery and continued until you come back for your follow-up appointment.   Attached is an instruction sheet on how to perform these two simple exercises.  Do these at least three times per day if not more.  You may bend your knee as much as is comfortable.  The puncture wounds may occasionally be slightly uncomfortable with bending of the knee.  Do not let this frighten you.  It is important to keep your knee motion, but do not overdo it.  If you have significant pain, simply do not bend the knee as far.   You will be given more exercises to perform at your first return visit.    RETURN APPOINTMENT Please make an appointment to be seen by your doctor in 14 days from your surgery.  Patient Signature:  ________________________________________________________  Nurse's Signature:  ________________________________________________________         General Anesthesia, Care After  Refer to this sheet in the next few weeks. These instructions provide you with information on caring for yourself after your procedure. Your health care provider may also give you more specific instructions. Your treatment has been planned according to current medical practices, but problems sometimes occur. Call your health care provider if you have any problems or questions after your procedure. WHAT TO EXPECT AFTER THE PROCEDURE After the procedure, it is typical to experience:  Sleepiness.  Nausea and vomiting. HOME CARE INSTRUCTIONS  For the first 24 hours after general anesthesia:  Have  a responsible person with you.  Do not drive a car. If you are alone, do not take public transportation.  Do not drink alcohol.  Do not take medicine that has not been prescribed by your health care provider.  Do not sign important papers or make important decisions.  You may resume a normal diet and activities as directed by your health care provider.  Change bandages (dressings) as directed.  If you have questions or problems that seem related to general anesthesia, call the hospital and ask for the anesthetist or anesthesiologist on call. SEEK MEDICAL CARE IF:  You have nausea and vomiting that continue the day after anesthesia.  You develop a rash. SEEK IMMEDIATE MEDICAL CARE IF:   You have difficulty breathing.  You have chest pain.  You have any allergic problems. Document Released: 03/04/2001 Document Revised: 12/01/2013 Document Reviewed: 06/11/2013 [facility] Patient Information 2015 [place], [place]. This information is not intended to replace advice given to you by your health care provider. Make sure you discuss any questions you have with your health care provider.

## 2014-07-30 NOTE — Progress Notes (Signed)
[facility]  [facility] is providing the following services: [facility]w for patient.  I explained that worker's comp has to be pre auth before walker can be dispensed, and that I would need the name and phone number of the adjuster handling the claim.  She called me back and stated that this is not in fact a worker's comp claim, but being handled through an attorney.  She gave a contact information for [facility] - [phone] and Clinical Nurse Hotline [phone]- [phone].  I called [facility] and left message on VM - however, they wanted a claim number in which we were not given.  Patient declined to pay for RW out of pocket.  I will inform the nurse that I have not yet received a call back and the patient could check her local [employer] as they sometimes have walkers and other DME at a very low price.      Renard HamperLecretia Williamson 07/30/2014, 4:14 PM

## 2014-07-30 NOTE — Transfer of Care (Signed)
Immediate Anesthesia Transfer of Care Note  Patient: Sharon Gregory  Procedure(s) Performed: Procedure(s): ARTHROSCOPY RIGHT KNEE, Synovectomy,CHONDROPLASTY (Right)  Patient Location: PACU  Anesthesia Type:General  Level of Consciousness: awake, alert  and patient cooperative  Airway & Oxygen Therapy: Patient Spontanous Breathing and Patient connected to face mask oxygen  Post-op Assessment: Report given to PACU RN and Post -op Vital signs reviewed and stable  Post vital signs: Reviewed and stable  Complications: No apparent anesthesia complications

## 2014-08-02 ENCOUNTER — Encounter (HOSPITAL_COMMUNITY): Payer: Self-pay | Admitting: Specialist

## 2014-08-14 ENCOUNTER — Emergency Department (HOSPITAL_COMMUNITY): Payer: Managed Care, Other (non HMO)

## 2014-08-14 ENCOUNTER — Emergency Department (HOSPITAL_COMMUNITY)
Admission: EM | Admit: 2014-08-14 | Discharge: 2014-08-14 | Disposition: A | Discharge status: Home / Self Care | Payer: Managed Care, Other (non HMO) | Attending: Emergency Medicine | Admitting: Emergency Medicine

## 2014-08-14 DIAGNOSIS — J441 Chronic obstructive pulmonary disease with (acute) exacerbation: Secondary | ICD-10-CM

## 2014-08-14 DIAGNOSIS — E119 Type 2 diabetes mellitus without complications: Secondary | ICD-10-CM | POA: Diagnosis not present

## 2014-08-14 DIAGNOSIS — Z794 Long term (current) use of insulin: Secondary | ICD-10-CM | POA: Insufficient documentation

## 2014-08-14 DIAGNOSIS — J45901 Unspecified asthma with (acute) exacerbation: Principal | ICD-10-CM

## 2014-08-14 DIAGNOSIS — F17200 Nicotine dependence, unspecified, uncomplicated: Secondary | ICD-10-CM | POA: Insufficient documentation

## 2014-08-14 DIAGNOSIS — Z8701 Personal history of pneumonia (recurrent): Secondary | ICD-10-CM | POA: Insufficient documentation

## 2014-08-14 DIAGNOSIS — R059 Cough, unspecified: Secondary | ICD-10-CM | POA: Diagnosis present

## 2014-08-14 DIAGNOSIS — R05 Cough: Secondary | ICD-10-CM | POA: Insufficient documentation

## 2014-08-14 DIAGNOSIS — Z79899 Other long term (current) drug therapy: Secondary | ICD-10-CM | POA: Diagnosis not present

## 2014-08-14 DIAGNOSIS — K219 Gastro-esophageal reflux disease without esophagitis: Secondary | ICD-10-CM | POA: Diagnosis not present

## 2014-08-14 DIAGNOSIS — IMO0002 Reserved for concepts with insufficient information to code with codable children: Secondary | ICD-10-CM | POA: Diagnosis not present

## 2014-08-14 DIAGNOSIS — Z8659 Personal history of other mental and behavioral disorders: Secondary | ICD-10-CM | POA: Diagnosis not present

## 2014-08-14 DIAGNOSIS — H9190 Unspecified hearing loss, unspecified ear: Secondary | ICD-10-CM | POA: Diagnosis not present

## 2014-08-14 LAB — TROPONIN I

## 2014-08-14 LAB — CBC
HCT: 37.4 % (ref 36.0–46.0)
HEMOGLOBIN: 12.8 g/dL (ref 12.0–15.0)
MCH: 28.4 pg (ref 26.0–34.0)
MCHC: 34.2 g/dL (ref 30.0–36.0)
MCV: 82.9 fL (ref 78.0–100.0)
Platelets: 259 10*3/uL (ref 150–400)
RBC: 4.51 MIL/uL (ref 3.87–5.11)
RDW: 12.4 % (ref 11.5–15.5)
WBC: 7.5 10*3/uL (ref 4.0–10.5)

## 2014-08-14 LAB — BASIC METABOLIC PANEL
Anion gap: 19 — ABNORMAL HIGH (ref 5–15)
BUN: 12 mg/dL (ref 6–23)
CO2: 23 mEq/L (ref 19–32)
Calcium: 9.6 mg/dL (ref 8.4–10.5)
Chloride: 96 mEq/L (ref 96–112)
Creatinine, Ser: 0.63 mg/dL (ref 0.50–1.10)
Glucose, Bld: 317 mg/dL — ABNORMAL HIGH (ref 70–99)
POTASSIUM: 3.7 meq/L (ref 3.7–5.3)
SODIUM: 138 meq/L (ref 137–147)

## 2014-08-14 LAB — CBG MONITORING, ED: GLUCOSE-CAPILLARY: 298 mg/dL — AB (ref 70–99)

## 2014-08-14 LAB — PRO B NATRIURETIC PEPTIDE: Pro B Natriuretic peptide (BNP): 20 pg/mL (ref 0–125)

## 2014-08-14 MED ORDER — PREDNISONE 10 MG PO TABS: 60.0000 mg | ORAL_TABLET | Freq: Every day | ORAL | Status: DC

## 2014-08-14 MED ORDER — ALBUTEROL SULFATE HFA 108 (90 BASE) MCG/ACT IN AERS: 2.0000 | INHALATION_SPRAY | RESPIRATORY_TRACT | Status: DC | PRN

## 2014-08-14 MED ORDER — OXYCODONE-ACETAMINOPHEN 5-325 MG PO TABS: 1.0000 | ORAL_TABLET | ORAL | Status: DC | PRN

## 2014-08-14 MED ORDER — ALBUTEROL (5 MG/ML) CONTINUOUS INHALATION SOLN
10.0000 mg/h | INHALATION_SOLUTION | Freq: Once | RESPIRATORY_TRACT | Status: AC
  Administered 2014-08-14: 10 mg/h via RESPIRATORY_TRACT
  Filled 2014-08-14: qty 20

## 2014-08-14 MED ORDER — METHYLPREDNISOLONE SODIUM SUCC 125 MG IJ SOLR
125.0000 mg | Freq: Once | INTRAMUSCULAR | Status: AC
  Administered 2014-08-14: 125 mg via INTRAVENOUS
  Filled 2014-08-14: qty 2

## 2014-08-14 MED ORDER — MORPHINE SULFATE 4 MG/ML IJ SOLN
6.0000 mg | Freq: Once | INTRAMUSCULAR | Status: AC
  Administered 2014-08-14: 6 mg via INTRAVENOUS
  Filled 2014-08-14: qty 2

## 2014-08-14 MED ORDER — IPRATROPIUM BROMIDE 0.02 % IN SOLN
0.5000 mg | Freq: Once | RESPIRATORY_TRACT | Status: AC
  Administered 2014-08-14: 0.5 mg via RESPIRATORY_TRACT
  Filled 2014-08-14: qty 2.5

## 2014-08-14 MED ORDER — IOHEXOL 350 MG/ML SOLN
100.0000 mL | Freq: Once | INTRAVENOUS | Status: AC | PRN
  Administered 2014-08-14: 100 mL via INTRAVENOUS

## 2014-08-14 NOTE — Discharge Instructions (Signed)

## 2014-08-14 NOTE — ED Notes (Signed)
Per pt, states surgery 2 weeks ago-since then she has cough which has exacerbated her COPD-no relief with neb, OTC meds

## 2014-08-14 NOTE — ED Notes (Signed)
RT called
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
## 2014-09-20 ENCOUNTER — Encounter (INDEPENDENT_AMBULATORY_CARE_PROVIDER_SITE_OTHER): Payer: Self-pay

## 2014-09-20 ENCOUNTER — Encounter: Payer: Self-pay | Admitting: Diagnostic Neuroimaging

## 2014-09-20 ENCOUNTER — Ambulatory Visit (INDEPENDENT_AMBULATORY_CARE_PROVIDER_SITE_OTHER): Payer: PRIVATE HEALTH INSURANCE | Admitting: Diagnostic Neuroimaging

## 2014-09-20 VITALS — BP 112/76 | Ht 64.75 in | Wt 284.6 lb

## 2014-09-20 DIAGNOSIS — F0781 Postconcussional syndrome: Secondary | ICD-10-CM

## 2014-09-20 DIAGNOSIS — G44321 Chronic post-traumatic headache, intractable: Secondary | ICD-10-CM

## 2014-09-20 MED ORDER — TOPIRAMATE 50 MG PO TABS: 50.0000 mg | ORAL_TABLET | Freq: Every day | ORAL | Status: DC

## 2014-09-20 NOTE — Patient Instructions (Signed)
Try topiramate 50mg  at bedtime; increase to 100mg  at bedtime after 2 weeks.
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
## 2014-09-26 ENCOUNTER — Other Ambulatory Visit: Payer: Self-pay | Admitting: Nurse Practitioner

## 2014-09-29 ENCOUNTER — Telehealth: Payer: Self-pay | Admitting: Diagnostic Neuroimaging

## 2014-09-29 DIAGNOSIS — F411 Generalized anxiety disorder: Secondary | ICD-10-CM

## 2014-09-29 DIAGNOSIS — F4310 Post-traumatic stress disorder, unspecified: Secondary | ICD-10-CM

## 2014-09-29 NOTE — Telephone Encounter (Signed)
Patient calling to request to speak with Dr. Marjory LiesPenumalli, states that she got information from her trauma counselor and she wants to relay that information so that a referral can be sent, please return call and advise.

## 2014-10-07 NOTE — Telephone Encounter (Signed)
Called patient on hp# and cell#. No answer.

## 2014-10-07 NOTE — Telephone Encounter (Signed)
Patient states she would like to go forward with psych referral. Patient would like referral and notes sent to: [facility], Dr. Thedore MinsMojeed Akintayo, Fax# [phone], Ph# [phone].

## 2014-10-07 NOTE — Telephone Encounter (Signed)
Done

## 2014-11-02 NOTE — Telephone Encounter (Signed)
Patient stated [facility], Dr. Thedore MinsMojeed Akintayo doesn't accept her insurance.  Requesting referral sent elsewhere preferably where [facility]  Please call and advise.

## 2014-11-02 NOTE — Telephone Encounter (Signed)
Sent message to [facility] ph [phone]

## 2014-11-26 ENCOUNTER — Encounter (HOSPITAL_COMMUNITY): Payer: Self-pay | Admitting: Emergency Medicine

## 2014-11-26 ENCOUNTER — Emergency Department (HOSPITAL_COMMUNITY)
Admission: EM | Admit: 2014-11-26 | Discharge: 2014-11-26 | Disposition: A | Discharge status: Home / Self Care | Payer: Managed Care, Other (non HMO) | Attending: Emergency Medicine | Admitting: Emergency Medicine

## 2014-11-26 DIAGNOSIS — K219 Gastro-esophageal reflux disease without esophagitis: Secondary | ICD-10-CM | POA: Insufficient documentation

## 2014-11-26 DIAGNOSIS — E119 Type 2 diabetes mellitus without complications: Secondary | ICD-10-CM | POA: Insufficient documentation

## 2014-11-26 DIAGNOSIS — Z8701 Personal history of pneumonia (recurrent): Secondary | ICD-10-CM | POA: Diagnosis not present

## 2014-11-26 DIAGNOSIS — Z794 Long term (current) use of insulin: Secondary | ICD-10-CM | POA: Diagnosis not present

## 2014-11-26 DIAGNOSIS — H9191 Unspecified hearing loss, right ear: Secondary | ICD-10-CM | POA: Insufficient documentation

## 2014-11-26 DIAGNOSIS — J449 Chronic obstructive pulmonary disease, unspecified: Secondary | ICD-10-CM | POA: Diagnosis not present

## 2014-11-26 DIAGNOSIS — Z920 Personal history of contraception: Secondary | ICD-10-CM | POA: Diagnosis not present

## 2014-11-26 DIAGNOSIS — Z79899 Other long term (current) drug therapy: Secondary | ICD-10-CM | POA: Diagnosis not present

## 2014-11-26 DIAGNOSIS — L509 Urticaria, unspecified: Secondary | ICD-10-CM | POA: Diagnosis not present

## 2014-11-26 DIAGNOSIS — F419 Anxiety disorder, unspecified: Secondary | ICD-10-CM | POA: Diagnosis not present

## 2014-11-26 DIAGNOSIS — R21 Rash and other nonspecific skin eruption: Secondary | ICD-10-CM | POA: Diagnosis present

## 2014-11-26 DIAGNOSIS — Z7951 Long term (current) use of inhaled steroids: Secondary | ICD-10-CM | POA: Diagnosis not present

## 2014-11-26 DIAGNOSIS — Z8669 Personal history of other diseases of the nervous system and sense organs: Secondary | ICD-10-CM | POA: Insufficient documentation

## 2014-11-26 DIAGNOSIS — L508 Other urticaria: Secondary | ICD-10-CM

## 2014-11-26 MED ORDER — DIPHENHYDRAMINE HCL 25 MG PO TABS: 25.0000 mg | ORAL_TABLET | Freq: Four times a day (QID) | ORAL | Status: DC

## 2014-11-26 MED ORDER — FAMOTIDINE 20 MG PO TABS: 20.0000 mg | ORAL_TABLET | Freq: Two times a day (BID) | ORAL | Status: DC

## 2014-11-26 MED ORDER — DIPHENHYDRAMINE HCL 50 MG/ML IJ SOLN
50.0000 mg | Freq: Once | INTRAMUSCULAR | Status: AC
  Administered 2014-11-26: 50 mg via INTRAMUSCULAR
  Filled 2014-11-26: qty 1

## 2014-11-26 MED ORDER — PREDNISONE 20 MG PO TABS
60.0000 mg | ORAL_TABLET | Freq: Once | ORAL | Status: AC
  Administered 2014-11-26: 60 mg via ORAL
  Filled 2014-11-26: qty 3

## 2014-11-26 MED ORDER — PREDNISONE 20 MG PO TABS: ORAL_TABLET | ORAL | Status: DC
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
## 2014-11-26 NOTE — Discharge Instructions (Signed)
Hives Hives are itchy, red, swollen areas of the skin. They can vary in size and location on your body. Hives can come and go for hours or several days (acute hives) or for several weeks (chronic hives). Hives do not spread from person to person (noncontagious). They may get worse with scratching, exercise, and emotional stress. CAUSES   Allergic reaction to food, additives, or drugs.  Infections, including the common cold.  Illness, such as vasculitis, lupus, or thyroid disease.  Exposure to sunlight, heat, or cold.  Exercise.  Stress.  Contact with chemicals. SYMPTOMS   Red or white swollen patches on the skin. The patches may change size, shape, and location quickly and repeatedly.  Itching.  Swelling of the hands, feet, and face. This may occur if hives develop deeper in the skin. DIAGNOSIS  Your caregiver can usually tell what is wrong by performing a physical exam. Skin or blood tests may also be done to determine the cause of your hives. In some cases, the cause cannot be determined. TREATMENT  Mild cases usually get better with medicines such as antihistamines. Severe cases may require an emergency epinephrine injection. If the cause of your hives is known, treatment includes avoiding that trigger.  HOME CARE INSTRUCTIONS   Avoid causes that trigger your hives.  Take antihistamines as directed by your caregiver to reduce the severity of your hives. Non-sedating or low-sedating antihistamines are usually recommended. Do not drive while taking an antihistamine.  Take any other medicines prescribed for itching as directed by your caregiver.  Wear loose-fitting clothing.  Keep all follow-up appointments as directed by your caregiver. SEEK MEDICAL CARE IF:   You have persistent or severe itching that is not relieved with medicine.  You have painful or swollen joints. SEEK IMMEDIATE MEDICAL CARE IF:   You have a fever.  Your tongue or lips are swollen.  You have  trouble breathing or swallowing.  You feel tightness in the throat or chest.  You have abdominal pain. These problems may be the first sign of a life-threatening allergic reaction. Call your local emergency services (911 in [place] MAKE SURE YOU:   Understand these instructions.  Will watch your condition.  Will get help right away if you are not doing well or get worse. Document Released: 11/26/2005 Document Revised: 12/01/2013 Document Reviewed: 02/19/2012 [employer] Patient Information 2015 [employer]. This information is not intended to replace advice given to you by your health care provider. Make sure you discuss any questions you have with your health care provider.  

## 2014-11-26 NOTE — ED Notes (Signed)
Pt escorted to discharge window. Pt verbalized understanding discharge instructions. In no acute distress.  

## 2014-11-26 NOTE — ED Notes (Signed)
Per pt, states hives since Sunday-started on arms and now on hand, breasts and feet-states benadryl not working-no overt signs on rash, no whelps or bumps noticed

## 2014-12-06 ENCOUNTER — Telehealth: Payer: Self-pay | Admitting: Diagnostic Neuroimaging

## 2014-12-06 ENCOUNTER — Encounter (HOSPITAL_BASED_OUTPATIENT_CLINIC_OR_DEPARTMENT_OTHER)
Admission: RE | Admit: 2014-12-06 | Discharge: 2014-12-06 | Disposition: A | Discharge status: Home / Self Care | Payer: Managed Care, Other (non HMO) | Source: Ambulatory Visit | Attending: Specialist | Admitting: Specialist

## 2014-12-06 ENCOUNTER — Encounter (HOSPITAL_BASED_OUTPATIENT_CLINIC_OR_DEPARTMENT_OTHER): Payer: Self-pay | Admitting: *Deleted

## 2014-12-06 DIAGNOSIS — G4730 Sleep apnea, unspecified: Secondary | ICD-10-CM | POA: Diagnosis not present

## 2014-12-06 DIAGNOSIS — F4310 Post-traumatic stress disorder, unspecified: Secondary | ICD-10-CM | POA: Diagnosis not present

## 2014-12-06 DIAGNOSIS — Z794 Long term (current) use of insulin: Secondary | ICD-10-CM | POA: Diagnosis not present

## 2014-12-06 DIAGNOSIS — F17210 Nicotine dependence, cigarettes, uncomplicated: Secondary | ICD-10-CM | POA: Diagnosis not present

## 2014-12-06 DIAGNOSIS — Z79899 Other long term (current) drug therapy: Secondary | ICD-10-CM | POA: Diagnosis not present

## 2014-12-06 DIAGNOSIS — K219 Gastro-esophageal reflux disease without esophagitis: Secondary | ICD-10-CM | POA: Diagnosis not present

## 2014-12-06 DIAGNOSIS — J45909 Unspecified asthma, uncomplicated: Secondary | ICD-10-CM | POA: Diagnosis not present

## 2014-12-06 DIAGNOSIS — J449 Chronic obstructive pulmonary disease, unspecified: Secondary | ICD-10-CM | POA: Diagnosis not present

## 2014-12-06 DIAGNOSIS — L732 Hidradenitis suppurativa: Secondary | ICD-10-CM | POA: Diagnosis not present

## 2014-12-06 DIAGNOSIS — F419 Anxiety disorder, unspecified: Secondary | ICD-10-CM | POA: Diagnosis not present

## 2014-12-06 LAB — BASIC METABOLIC PANEL
Anion gap: 7 (ref 5–15)
BUN: 9 mg/dL (ref 6–23)
CHLORIDE: 102 meq/L (ref 96–112)
CO2: 28 mmol/L (ref 19–32)
Calcium: 10 mg/dL (ref 8.4–10.5)
Creatinine, Ser: 0.75 mg/dL (ref 0.50–1.10)
GFR calc Af Amer: >90 mL/min (ref >90)
GFR calc non Af Amer: >90 mL/min (ref >90)
GLUCOSE: 131 mg/dL — AB (ref 70–99)
POTASSIUM: 5.2 mmol/L — AB (ref 3.5–5.1)
Sodium: 137 mmol/L (ref 135–145)

## 2014-12-06 NOTE — Pre-Procedure Instructions (Signed)
To come for BMET and anesthesia airway evaluation. 

## 2014-12-06 NOTE — Telephone Encounter (Signed)
Re-ordered. -VRP

## 2014-12-06 NOTE — Progress Notes (Signed)
Cleared by Dr Ivin Bootyrews for  anesthesia consult.

## 2014-12-06 NOTE — Telephone Encounter (Signed)
Returned call. No answer.  

## 2014-12-06 NOTE — Telephone Encounter (Signed)
Pt is calling stating she has been waiting over 35 days for a referral.  She would like for someone to call her back today.  She states that she was referred to Dr. Leonides CaveZelson and he states that he is not the right doctor for her.  Please call and advise.

## 2014-12-07 ENCOUNTER — Ambulatory Visit (HOSPITAL_BASED_OUTPATIENT_CLINIC_OR_DEPARTMENT_OTHER): Payer: Managed Care, Other (non HMO) | Admitting: Certified Registered"

## 2014-12-07 ENCOUNTER — Ambulatory Visit (HOSPITAL_BASED_OUTPATIENT_CLINIC_OR_DEPARTMENT_OTHER)
Admission: RE | Admit: 2014-12-07 | Discharge: 2014-12-07 | Disposition: A | Discharge status: Home / Self Care | Payer: Managed Care, Other (non HMO) | Source: Ambulatory Visit | Attending: Specialist | Admitting: Specialist

## 2014-12-07 ENCOUNTER — Encounter (HOSPITAL_BASED_OUTPATIENT_CLINIC_OR_DEPARTMENT_OTHER)
Admission: RE | Disposition: A | Discharge status: Home / Self Care | Payer: Self-pay | Source: Ambulatory Visit | Attending: Specialist

## 2014-12-07 ENCOUNTER — Encounter (HOSPITAL_BASED_OUTPATIENT_CLINIC_OR_DEPARTMENT_OTHER): Payer: Self-pay | Admitting: *Deleted

## 2014-12-07 DIAGNOSIS — L732 Hidradenitis suppurativa: Secondary | ICD-10-CM | POA: Insufficient documentation

## 2014-12-07 DIAGNOSIS — F4310 Post-traumatic stress disorder, unspecified: Secondary | ICD-10-CM | POA: Insufficient documentation

## 2014-12-07 DIAGNOSIS — J45909 Unspecified asthma, uncomplicated: Secondary | ICD-10-CM | POA: Insufficient documentation

## 2014-12-07 DIAGNOSIS — F419 Anxiety disorder, unspecified: Secondary | ICD-10-CM | POA: Insufficient documentation

## 2014-12-07 DIAGNOSIS — G4730 Sleep apnea, unspecified: Secondary | ICD-10-CM | POA: Insufficient documentation

## 2014-12-07 DIAGNOSIS — Z79899 Other long term (current) drug therapy: Secondary | ICD-10-CM | POA: Insufficient documentation

## 2014-12-07 DIAGNOSIS — F17210 Nicotine dependence, cigarettes, uncomplicated: Secondary | ICD-10-CM | POA: Insufficient documentation

## 2014-12-07 DIAGNOSIS — Z794 Long term (current) use of insulin: Secondary | ICD-10-CM | POA: Insufficient documentation

## 2014-12-07 DIAGNOSIS — J449 Chronic obstructive pulmonary disease, unspecified: Secondary | ICD-10-CM | POA: Insufficient documentation

## 2014-12-07 DIAGNOSIS — K219 Gastro-esophageal reflux disease without esophagitis: Secondary | ICD-10-CM | POA: Insufficient documentation

## 2014-12-07 HISTORY — PX: HYDRADENITIS EXCISION: SHX5243

## 2014-12-07 HISTORY — DX: Post-traumatic stress disorder, unspecified: F43.10

## 2014-12-07 HISTORY — DX: Hidradenitis suppurativa: L73.2

## 2014-12-07 HISTORY — DX: Reserved for inherently not codable concepts without codable children: IMO0001

## 2014-12-07 HISTORY — DX: Other allergy status, other than to drugs and biological substances: Z91.09

## 2014-12-07 HISTORY — DX: Postconcussional syndrome: F07.81

## 2014-12-07 HISTORY — DX: Long term (current) use of insulin: Z79.4

## 2014-12-07 HISTORY — DX: Other chronic pain: G89.29

## 2014-12-07 HISTORY — DX: Type 2 diabetes mellitus without complications: E11.9

## 2014-12-07 LAB — GLUCOSE, CAPILLARY
Glucose-Capillary: 152 mg/dL — ABNORMAL HIGH (ref 70–99)
Glucose-Capillary: 129 mg/dL — ABNORMAL HIGH (ref 70–99)

## 2014-12-07 LAB — POCT HEMOGLOBIN-HEMACUE: HEMOGLOBIN: 11.4 g/dL — AB (ref 12.0–15.0)

## 2014-12-07 SURGERY — EXCISION, HIDRADENITIS, AXILLA
Anesthesia: General | Site: Axilla

## 2014-12-07 MED ORDER — CEFAZOLIN SODIUM 1-5 GM-% IV SOLN
INTRAVENOUS | Status: AC
  Filled 2014-12-07: qty 50

## 2014-12-07 MED ORDER — FENTANYL CITRATE 0.05 MG/ML IJ SOLN
INTRAMUSCULAR | Status: DC | PRN
  Administered 2014-12-07: 50 ug via INTRAVENOUS
  Administered 2014-12-07: 25 ug via INTRAVENOUS
  Administered 2014-12-07: 50 ug via INTRAVENOUS

## 2014-12-07 MED ORDER — HYDROMORPHONE HCL 1 MG/ML IJ SOLN
INTRAMUSCULAR | Status: AC
  Filled 2014-12-07: qty 1

## 2014-12-07 MED ORDER — ONDANSETRON HCL 4 MG/2ML IJ SOLN: 4.0000 mg | Freq: Once | INTRAMUSCULAR | Status: DC | PRN

## 2014-12-07 MED ORDER — HYDROMORPHONE HCL 1 MG/ML IJ SOLN
0.2500 mg | INTRAMUSCULAR | Status: DC | PRN
  Administered 2014-12-07 (×5): 0.5 mg via INTRAVENOUS

## 2014-12-07 MED ORDER — HYDROMORPHONE HCL 1 MG/ML IJ SOLN: 0.5000 mg | INTRAMUSCULAR | Status: DC | PRN

## 2014-12-07 MED ORDER — OXYCODONE HCL 5 MG PO TABS
5.0000 mg | ORAL_TABLET | Freq: Once | ORAL | Status: AC | PRN
  Administered 2014-12-07: 5 mg via ORAL

## 2014-12-07 MED ORDER — LIDOCAINE HCL (CARDIAC) 20 MG/ML IV SOLN
INTRAVENOUS | Status: DC | PRN
  Administered 2014-12-07: 60 mg via INTRAVENOUS

## 2014-12-07 MED ORDER — MIDAZOLAM HCL 2 MG/2ML IJ SOLN
INTRAMUSCULAR | Status: AC
  Filled 2014-12-07: qty 2

## 2014-12-07 MED ORDER — ONDANSETRON HCL 4 MG/2ML IJ SOLN
INTRAMUSCULAR | Status: DC | PRN
  Administered 2014-12-07: 4 mg via INTRAVENOUS

## 2014-12-07 MED ORDER — LIDOCAINE-EPINEPHRINE 0.5 %-1:200000 IJ SOLN
INTRAMUSCULAR | Status: DC | PRN
  Administered 2014-12-07: 100 mL

## 2014-12-07 MED ORDER — PROPOFOL 10 MG/ML IV BOLUS
INTRAVENOUS | Status: DC | PRN
  Administered 2014-12-07: 200 mg via INTRAVENOUS

## 2014-12-07 MED ORDER — FENTANYL CITRATE 0.05 MG/ML IJ SOLN: 50.0000 ug | INTRAMUSCULAR | Status: DC | PRN

## 2014-12-07 MED ORDER — LIDOCAINE-EPINEPHRINE 0.5 %-1:200000 IJ SOLN
INTRAMUSCULAR | Status: AC
  Filled 2014-12-07: qty 1

## 2014-12-07 MED ORDER — CEFAZOLIN SODIUM-DEXTROSE 2-3 GM-% IV SOLR
INTRAVENOUS | Status: AC
  Filled 2014-12-07: qty 50

## 2014-12-07 MED ORDER — OXYCODONE HCL 5 MG PO TABS
ORAL_TABLET | ORAL | Status: AC
  Filled 2014-12-07: qty 1

## 2014-12-07 MED ORDER — MIDAZOLAM HCL 5 MG/5ML IJ SOLN
INTRAMUSCULAR | Status: DC | PRN
  Administered 2014-12-07: 2 mg via INTRAVENOUS

## 2014-12-07 MED ORDER — FENTANYL CITRATE 0.05 MG/ML IJ SOLN
INTRAMUSCULAR | Status: AC
  Filled 2014-12-07: qty 8

## 2014-12-07 MED ORDER — MIDAZOLAM HCL 2 MG/ML PO SYRP: 12.0000 mg | ORAL_SOLUTION | Freq: Once | ORAL | Status: DC | PRN

## 2014-12-07 MED ORDER — DEXTROSE 5 % IV SOLN
3.0000 g | INTRAVENOUS | Status: AC
  Administered 2014-12-07: 3 g via INTRAVENOUS

## 2014-12-07 MED ORDER — DEXAMETHASONE SODIUM PHOSPHATE 4 MG/ML IJ SOLN
INTRAMUSCULAR | Status: DC | PRN
  Administered 2014-12-07: 4 mg via INTRAVENOUS

## 2014-12-07 MED ORDER — EPINEPHRINE HCL 1 MG/ML IJ SOLN
INTRAMUSCULAR | Status: AC
  Filled 2014-12-07: qty 1

## 2014-12-07 MED ORDER — MIDAZOLAM HCL 2 MG/2ML IJ SOLN: 1.0000 mg | INTRAMUSCULAR | Status: DC | PRN

## 2014-12-07 MED ORDER — ONDANSETRON HCL 4 MG/2ML IJ SOLN
8.0000 mg | Freq: Once | INTRAMUSCULAR | Status: AC
  Administered 2014-12-07: 6 mg via INTRAVENOUS

## 2014-12-07 MED ORDER — LACTATED RINGERS IV SOLN
INTRAVENOUS | Status: DC
  Administered 2014-12-07: 13:00:00 via INTRAVENOUS
  Administered 2014-12-07: 10 mL/h via INTRAVENOUS

## 2014-12-07 MED ORDER — ONDANSETRON HCL 4 MG/2ML IJ SOLN
INTRAMUSCULAR | Status: AC
  Filled 2014-12-07: qty 2

## 2014-12-07 MED ORDER — LIDOCAINE HCL (PF) 1 % IJ SOLN
INTRAMUSCULAR | Status: AC
  Filled 2014-12-07: qty 30

## 2014-12-07 MED ORDER — OXYCODONE HCL 5 MG/5ML PO SOLN: 5.0000 mg | Freq: Once | ORAL | Status: AC | PRN

## 2014-12-07 SURGICAL SUPPLY — 63 items
BAG DECANTER FOR FLEXI CONT (MISCELLANEOUS) ×3 IMPLANT
BENZOIN TINCTURE PRP APPL 2/3 (GAUZE/BANDAGES/DRESSINGS) ×3 IMPLANT
BLADE KNIFE PERSONA 10 (BLADE) ×3 IMPLANT
BLADE KNIFE PERSONA 15 (BLADE) IMPLANT
BNDG COHESIVE 4X5 TAN STRL (GAUZE/BANDAGES/DRESSINGS) ×3 IMPLANT
BRIEF STRETCH FOR OB PAD LRG (UNDERPADS AND DIAPERS) IMPLANT
CANISTER SUCT 1200ML W/VALVE (MISCELLANEOUS) ×3 IMPLANT
COVER BACK TABLE 60X90IN (DRAPES) ×3 IMPLANT
COVER MAYO STAND STRL (DRAPES) ×3 IMPLANT
DECANTER SPIKE VIAL GLASS SM (MISCELLANEOUS) IMPLANT
DRAIN CHANNEL 10M FLAT 3/4 FLT (DRAIN) IMPLANT
DRAIN CHANNEL 7F FF FLAT (WOUND CARE) IMPLANT
DRAPE U-SHAPE 76X120 STRL (DRAPES) ×6 IMPLANT
DRSG PAD ABDOMINAL 8X10 ST (GAUZE/BANDAGES/DRESSINGS) ×3 IMPLANT
DRSG TELFA 3X8 NADH (GAUZE/BANDAGES/DRESSINGS) IMPLANT
ELECT REM PT RETURN 9FT ADLT (ELECTROSURGICAL) ×3
ELECTRODE REM PT RTRN 9FT ADLT (ELECTROSURGICAL) ×1 IMPLANT
EVACUATOR SILICONE 100CC (DRAIN) IMPLANT
FILTER 7/8 IN (FILTER) IMPLANT
GAUZE SPONGE 4X4 12PLY STRL (GAUZE/BANDAGES/DRESSINGS) ×3 IMPLANT
GAUZE XEROFORM 5X9 LF (GAUZE/BANDAGES/DRESSINGS) ×3 IMPLANT
GLOVE BIO SURGEON STRL SZ 6.5 (GLOVE) IMPLANT
GLOVE BIO SURGEONS STRL SZ 6.5 (GLOVE)
GLOVE BIOGEL M STRL SZ7.5 (GLOVE) IMPLANT
GLOVE BIOGEL PI IND STRL 7.0 (GLOVE) ×1 IMPLANT
GLOVE BIOGEL PI IND STRL 8 (GLOVE) IMPLANT
GLOVE BIOGEL PI INDICATOR 7.0 (GLOVE) ×2
GLOVE BIOGEL PI INDICATOR 8 (GLOVE)
GLOVE ECLIPSE 6.5 STRL STRAW (GLOVE) ×3 IMPLANT
GLOVE ECLIPSE 7.0 STRL STRAW (GLOVE) ×3 IMPLANT
GLOVE EXAM NITRILE MD LF STRL (GLOVE) ×3 IMPLANT
GOWN STRL REUS W/ TWL XL LVL3 (GOWN DISPOSABLE) ×1 IMPLANT
GOWN STRL REUS W/TWL XL LVL3 (GOWN DISPOSABLE) ×2
IV NS 500ML (IV SOLUTION)
IV NS 500ML BAXH (IV SOLUTION) IMPLANT
NDL SAFETY ECLIPSE 18X1.5 (NEEDLE) IMPLANT
NEEDLE HYPO 18GX1.5 SHARP (NEEDLE)
NEEDLE HYPO 25X1 1.5 SAFETY (NEEDLE) ×3 IMPLANT
NEEDLE SPNL 18GX3.5 QUINCKE PK (NEEDLE) IMPLANT
NS IRRIG 1000ML POUR BTL (IV SOLUTION) ×3 IMPLANT
PACK BASIN DAY SURGERY FS (CUSTOM PROCEDURE TRAY) ×3 IMPLANT
PEN SKIN MARKING BROAD TIP (MISCELLANEOUS) ×3 IMPLANT
PIN SAFETY STERILE (MISCELLANEOUS) IMPLANT
SHEET MEDIUM DRAPE 40X70 STRL (DRAPES) IMPLANT
SLEEVE SCD COMPRESS KNEE MED (MISCELLANEOUS) ×3 IMPLANT
SPONGE LAP 18X18 X RAY DECT (DISPOSABLE) ×6 IMPLANT
STAPLER VISISTAT 35W (STAPLE) IMPLANT
STOCKINETTE IMPERVIOUS LG (DRAPES) ×3 IMPLANT
STRIP SUTURE WOUND CLOSURE 1/2 (SUTURE) ×6 IMPLANT
SUT ETHILON 3 0 FSL (SUTURE) IMPLANT
SUT MNCRL AB 3-0 PS2 18 (SUTURE) ×3 IMPLANT
SUT MON AB 2-0 CT1 36 (SUTURE) ×3 IMPLANT
SUT PROLENE 4 0 P 3 18 (SUTURE) IMPLANT
SYR 20CC LL (SYRINGE) IMPLANT
SYR 50ML LL SCALE MARK (SYRINGE) IMPLANT
SYR CONTROL 10ML LL (SYRINGE) ×3 IMPLANT
TOWEL OR 17X24 6PK STRL BLUE (TOWEL DISPOSABLE) ×6 IMPLANT
TRAY DSU PREP LF (CUSTOM PROCEDURE TRAY) ×3 IMPLANT
TUBE CONNECTING 20'X1/4 (TUBING) ×1
TUBE CONNECTING 20X1/4 (TUBING) ×2 IMPLANT
UNDERPAD 30X30 INCONTINENT (UNDERPADS AND DIAPERS) ×3 IMPLANT
VAC PENCILS W/TUBING CLEAR (MISCELLANEOUS) ×3 IMPLANT
YANKAUER SUCT BULB TIP NO VENT (SUCTIONS) ×3 IMPLANT

## 2014-12-07 NOTE — Discharge Instructions (Signed)
Activity (include date of return to work if known) °As tolerated: NO showers °NO driving °No heavy activities ° °Diet:regular No restrictions: ° °Wound Care: Keep dressing clean & dry ° °Do not change dressings ° °Special Instructions: °Do not raise arms up °Call Doctor if any unusual problems occur such as pain, excessive °Bleeding, unrelieved Nausea/vomiting, Fever &/or chills °When lying down, keep head elevated on 2-3 pillows or back-rest ° °Follow-up appointment: Please call the office. ° °The patient received discharge instruction from:___________________________________________ ° ° ° °Patient signature ________________________________________ / Date___________ ° ° ° °Signature of individual providing instructions/ Date________________            ° ° °Post Anesthesia Home Care Instructions ° °Activity: °Get plenty of rest for the remainder of the day. A responsible adult should stay with you for 24 hours following the procedure.  °For the next 24 hours, DO NOT: °-Drive a car °-Operate machinery °-Drink alcoholic beverages °-Take any medication unless instructed by your physician °-Make any legal decisions or sign important papers. ° °Meals: °Start with liquid foods such as gelatin or soup. Progress to regular foods as tolerated. Avoid greasy, spicy, heavy foods. If nausea and/or vomiting occur, drink only clear liquids until the nausea and/or vomiting subsides. Call your physician if vomiting continues. ° °Special Instructions/Symptoms: °Your throat may feel dry or sore from the anesthesia or the breathing tube placed in your throat during surgery. If this causes discomfort, gargle with warm salt water. The discomfort should disappear within 24 hours. ° °

## 2014-12-07 NOTE — Anesthesia Preprocedure Evaluation (Signed)
Anesthesia Evaluation  Patient identified by MRN, date of birth, ID band Patient awake    Reviewed: Allergy & Precautions, H&P , NPO status , Patient's Chart, lab work & pertinent test results  History of Anesthesia Complications (+) PONV  Airway Mallampati: I  TM Distance: >3 FB Neck ROM: Full    Dental  (+) Teeth Intact, Dental Advisory Given   Pulmonary Current Smoker,  breath sounds clear to auscultation        Cardiovascular Rhythm:Regular Rate:Normal     Neuro/Psych    GI/Hepatic GERD-  Medicated and Controlled,  Endo/Other  diabetes, Well Controlled, Type 2, Insulin Dependent, Oral Hypoglycemic AgentsMorbid obesity  Renal/GU      Musculoskeletal   Abdominal   Peds  Hematology   Anesthesia Other Findings   Reproductive/Obstetrics                             Anesthesia Physical Anesthesia Plan  ASA: III  Anesthesia Plan: General   Post-op Pain Management:    Induction: Intravenous  Airway Management Planned: LMA  Additional Equipment:   Intra-op Plan:   Post-operative Plan: Extubation in OR  Informed Consent: I have reviewed the patients History and Physical, chart, labs and discussed the procedure including the risks, benefits and alternatives for the proposed anesthesia with the patient or authorized representative who has indicated his/her understanding and acceptance.   Dental advisory given  Plan Discussed with: CRNA, Anesthesiologist and Surgeon  Anesthesia Plan Comments:         Anesthesia Quick Evaluation
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
## 2014-12-07 NOTE — Brief Op Note (Signed)
12/07/2014  1:30 PM  PATIENT:  Sharon Gregory  33 y.o. female  PRE-OPERATIVE DIAGNOSIS:  HIDRADENITIS AXILLA  POST-OPERATIVE DIAGNOSIS:  HIDRADENITIS AXILLA  PROCEDURE:  Procedure(s): EXCISION HIDRADENITIS AXILLA, RYAN POLLOCK  CLOSURE  (N/A)  SURGEON:  Surgeon(s) and Role:    * Louisa SecondGerald Brendan Gruwell, MD - Primary  PHYSICIAN ASSISTANT:   ASSISTANTS: none   ANESTHESIA:   general  EBL:     BLOOD ADMINISTERED:none  DRAINS: none   LOCAL MEDICATIONS USED:  LIDOCAINE   SPECIMEN:  Excision  DISPOSITION OF SPECIMEN:  PATHOLOGY  COUNTS:  YES  TOURNIQUET:  * No tourniquets in log *  DICTATION: .Other Dictation: Dictation Number [id number]  PLAN OF CARE: Discharge to home after PACU  PATIENT DISPOSITION:  PACU - hemodynamically stable.   Delay start of Pharmacological VTE agent (>24hrs) due to surgical blood loss or risk of bleeding: yes

## 2014-12-07 NOTE — Anesthesia Postprocedure Evaluation (Signed)
  Anesthesia Post-op Note  Patient: Sharon Gregory  Procedure(s) Performed: Procedure(s): EXCISION HIDRADENITIS AXILLA, RYAN POLLOCK  CLOSURE  (N/A)  Patient Location: PACU  Anesthesia Type: General   Level of Consciousness: awake, alert  and oriented  Airway and Oxygen Therapy: Patient Spontanous Breathing  Post-op Pain: mild  Post-op Assessment: Post-op Vital signs reviewed  Post-op Vital Signs: Reviewed  Last Vitals:  Filed Vitals:   12/07/14 1545  BP: 113/80  Pulse: 105  Temp:   Resp: 23    Complications: No apparent anesthesia complications

## 2014-12-07 NOTE — Anesthesia Procedure Notes (Signed)
Procedure Name: LMA Insertion Date/Time: 12/07/2014 12:48 PM Performed by: Rabab Currington Pre-anesthesia Checklist: Patient identified, Emergency Drugs available, Suction available and Patient being monitored Patient Re-evaluated:Patient Re-evaluated prior to inductionOxygen Delivery Method: Circle System Utilized Preoxygenation: Pre-oxygenation with 100% oxygen Intubation Type: IV induction Ventilation: Mask ventilation without difficulty LMA: LMA inserted LMA Size: 4.0 Number of attempts: 1 Airway Equipment and Method: bite block Placement Confirmation: positive ETCO2 Tube secured with: Tape Dental Injury: Teeth and Oropharynx as per pre-operative assessment

## 2014-12-07 NOTE — Transfer of Care (Signed)
Immediate Anesthesia Transfer of Care Note  Patient: Sharon Gregory  Procedure(s) Performed: Procedure(s): EXCISION HIDRADENITIS AXILLA, RYAN POLLOCK  CLOSURE  (N/A)  Patient Location: PACU  Anesthesia Type:General  Level of Consciousness: awake, alert , oriented and patient cooperative  Airway & Oxygen Therapy: Patient Spontanous Breathing and Patient connected to face mask oxygen  Post-op Assessment: Report given to PACU RN and Post -op Vital signs reviewed and stable  Post vital signs: Reviewed and stable  Complications: No apparent anesthesia complications

## 2014-12-08 ENCOUNTER — Encounter (HOSPITAL_BASED_OUTPATIENT_CLINIC_OR_DEPARTMENT_OTHER): Payer: Self-pay | Admitting: Specialist

## 2014-12-08 NOTE — Addendum Note (Signed)
Addendum  created 12/08/14 0831 by Tonette Bihariimothy [place] Droll   Modules edited: Anesthesia Responsible Staff

## 2014-12-08 NOTE — Op Note (Signed)
NAMBeverly Gust:  Sharon Gregory, Sharon Gregory              ACCOUNT NO.:  [id number]  MEDICAL RECORD NO.:  [id number]  LOCATION:                                 FACILITY:  PHYSICIAN:  Earvin HansenGerald L. Ved Martos, M.D.DATE OF BIRTH:  May 07, 1981  DATE OF PROCEDURE:  12/07/2014 DATE OF DISCHARGE:  12/07/2014                              OPERATIVE REPORT   A 33-year-lady with severe history of hidradenitis suppurativa involving the right and left axillary regions, left side greater than the right. She has had multiple I and D's of the areas of refractory pain, infection and smell, social embarrassment with the areas draining when these areas are hot.  Now, being prepared for excision of the hidradenitis of the left axilla area with Estill Battenyan Pollack advancement flap closure.  The areas measured approximately 8 x 4-1/2 inches.  PROCEDURES DONE:  As above.  DESCRIPTION OF PROCEDURE:  She was taken to the operating room, placed on operating room table in the supine position, was given adequate general anesthesia, intubated orally.  Prep was done to the left upper extremity axillary, chest, neck areas with Hibiclens soap and solution, walled off with sterile towels and drapes so as to make a sterile field. 0.25% Xylocaine with epinephrine was injected locally to the sites of the excision, total of 100 mL, 1:200,000 concentration.  After this, the areas were excised with pure cutting on the coagulation removing a large petal __________ of tissue down to the superficial fascia.  Hemostasis was maintained with the Bovie on coagulation.  After this, the medial and lateral flaps were freed up approximately 6 to 8 cm on each side. After this, the flaps were advanced and closed deeply with 2-0 Monocryl x2 layers to the fascia and the subcutaneous tissue, subcutaneous closure with 2-0 Monocryl, subdermal closure with 3-0 Monocryl, and then a running subcuticular stitch of 3-0 Monocryl throughout the whole area. This was reinforced  with spaced sutures of 3-0 nylon.  The wounds were cleansed.  Steri-Strips and soft dressing were applied to all areas.  ESTIMATED BLOOD LOSS:  Less than 100 mL.  COMPLICATIONS:  None.     Yaakov GuthrieGerald L. Shon Houghruesdale, M.D.     Cathie HoopsGLT/MEDQ  D:  12/07/2014  T:  12/07/2014  Job:  [id number]

## 2014-12-14 NOTE — Addendum Note (Signed)
Addendum  created 12/14/14 1644 by Kerby Noraavid A Johannes Everage, MD   Modules edited: Anesthesia Responsible Staff

## 2014-12-24 ENCOUNTER — Ambulatory Visit (INDEPENDENT_AMBULATORY_CARE_PROVIDER_SITE_OTHER): Payer: Managed Care, Other (non HMO) | Admitting: Diagnostic Neuroimaging

## 2014-12-24 ENCOUNTER — Encounter: Payer: Self-pay | Admitting: Diagnostic Neuroimaging

## 2014-12-24 VITALS — BP 127/91 | HR 102 | Temp 97.1°F | Ht 64.0 in | Wt 284.0 lb

## 2014-12-24 DIAGNOSIS — G44329 Chronic post-traumatic headache, not intractable: Secondary | ICD-10-CM

## 2014-12-24 DIAGNOSIS — F4310 Post-traumatic stress disorder, unspecified: Secondary | ICD-10-CM

## 2014-12-24 DIAGNOSIS — F0781 Postconcussional syndrome: Secondary | ICD-10-CM

## 2014-12-24 MED ORDER — TOPIRAMATE 50 MG PO TABS: 100.0000 mg | ORAL_TABLET | Freq: Two times a day (BID) | ORAL | Status: DC
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
## 2014-12-24 NOTE — Patient Instructions (Signed)
Increase topiramate to 100mg  twice a day as tolerated.

## 2015-01-10 DIAGNOSIS — L732 Hidradenitis suppurativa: Secondary | ICD-10-CM

## 2015-01-10 HISTORY — DX: Hidradenitis suppurativa: L73.2

## 2015-01-31 ENCOUNTER — Encounter (HOSPITAL_BASED_OUTPATIENT_CLINIC_OR_DEPARTMENT_OTHER): Payer: Self-pay | Admitting: *Deleted

## 2015-01-31 NOTE — Pre-Procedure Instructions (Signed)
To come for BMET 

## 2015-02-04 ENCOUNTER — Encounter (HOSPITAL_BASED_OUTPATIENT_CLINIC_OR_DEPARTMENT_OTHER)
Admission: RE | Admit: 2015-02-04 | Discharge: 2015-02-04 | Disposition: A | Discharge status: Home / Self Care | Payer: Managed Care, Other (non HMO) | Source: Ambulatory Visit | Attending: Specialist | Admitting: Specialist

## 2015-02-04 DIAGNOSIS — L732 Hidradenitis suppurativa: Secondary | ICD-10-CM | POA: Diagnosis not present

## 2015-02-04 DIAGNOSIS — E119 Type 2 diabetes mellitus without complications: Secondary | ICD-10-CM | POA: Diagnosis not present

## 2015-02-04 DIAGNOSIS — Z91018 Allergy to other foods: Secondary | ICD-10-CM | POA: Diagnosis not present

## 2015-02-04 DIAGNOSIS — J449 Chronic obstructive pulmonary disease, unspecified: Secondary | ICD-10-CM | POA: Diagnosis not present

## 2015-02-04 DIAGNOSIS — Z794 Long term (current) use of insulin: Secondary | ICD-10-CM | POA: Diagnosis not present

## 2015-02-04 DIAGNOSIS — F419 Anxiety disorder, unspecified: Secondary | ICD-10-CM | POA: Diagnosis not present

## 2015-02-04 DIAGNOSIS — Z87891 Personal history of nicotine dependence: Secondary | ICD-10-CM | POA: Diagnosis not present

## 2015-02-04 DIAGNOSIS — K219 Gastro-esophageal reflux disease without esophagitis: Secondary | ICD-10-CM | POA: Diagnosis not present

## 2015-02-04 DIAGNOSIS — J45909 Unspecified asthma, uncomplicated: Secondary | ICD-10-CM | POA: Diagnosis not present

## 2015-02-04 DIAGNOSIS — G4730 Sleep apnea, unspecified: Secondary | ICD-10-CM | POA: Diagnosis not present

## 2015-02-04 LAB — BASIC METABOLIC PANEL
Anion gap: 7 (ref 5–15)
BUN: 11 mg/dL (ref 6–23)
CO2: 26 mmol/L (ref 19–32)
Calcium: 9.6 mg/dL (ref 8.4–10.5)
Chloride: 104 mmol/L (ref 96–112)
Creatinine, Ser: 0.88 mg/dL (ref 0.50–1.10)
GFR calc non Af Amer: 84 mL/min — ABNORMAL LOW (ref >90)
Glucose, Bld: 116 mg/dL — ABNORMAL HIGH (ref 70–99)
POTASSIUM: 3.3 mmol/L — AB (ref 3.5–5.1)
Sodium: 137 mmol/L (ref 135–145)

## 2015-02-07 ENCOUNTER — Encounter (HOSPITAL_BASED_OUTPATIENT_CLINIC_OR_DEPARTMENT_OTHER): Payer: Self-pay | Admitting: Anesthesiology

## 2015-02-07 ENCOUNTER — Ambulatory Visit (HOSPITAL_BASED_OUTPATIENT_CLINIC_OR_DEPARTMENT_OTHER): Payer: Managed Care, Other (non HMO) | Admitting: Anesthesiology

## 2015-02-07 ENCOUNTER — Encounter (HOSPITAL_BASED_OUTPATIENT_CLINIC_OR_DEPARTMENT_OTHER)
Admission: RE | Disposition: A | Discharge status: Home / Self Care | Payer: Self-pay | Source: Ambulatory Visit | Attending: Specialist

## 2015-02-07 ENCOUNTER — Ambulatory Visit (HOSPITAL_BASED_OUTPATIENT_CLINIC_OR_DEPARTMENT_OTHER)
Admission: RE | Admit: 2015-02-07 | Discharge: 2015-02-07 | Disposition: A | Discharge status: Home / Self Care | Payer: Managed Care, Other (non HMO) | Source: Ambulatory Visit | Attending: Specialist | Admitting: Specialist

## 2015-02-07 DIAGNOSIS — Z794 Long term (current) use of insulin: Secondary | ICD-10-CM | POA: Insufficient documentation

## 2015-02-07 DIAGNOSIS — F419 Anxiety disorder, unspecified: Secondary | ICD-10-CM | POA: Insufficient documentation

## 2015-02-07 DIAGNOSIS — E119 Type 2 diabetes mellitus without complications: Secondary | ICD-10-CM | POA: Insufficient documentation

## 2015-02-07 DIAGNOSIS — J45909 Unspecified asthma, uncomplicated: Secondary | ICD-10-CM | POA: Insufficient documentation

## 2015-02-07 DIAGNOSIS — Z91018 Allergy to other foods: Secondary | ICD-10-CM | POA: Insufficient documentation

## 2015-02-07 DIAGNOSIS — Z87891 Personal history of nicotine dependence: Secondary | ICD-10-CM | POA: Insufficient documentation

## 2015-02-07 DIAGNOSIS — L732 Hidradenitis suppurativa: Secondary | ICD-10-CM | POA: Insufficient documentation

## 2015-02-07 DIAGNOSIS — J449 Chronic obstructive pulmonary disease, unspecified: Secondary | ICD-10-CM | POA: Insufficient documentation

## 2015-02-07 DIAGNOSIS — K219 Gastro-esophageal reflux disease without esophagitis: Secondary | ICD-10-CM | POA: Insufficient documentation

## 2015-02-07 DIAGNOSIS — G4730 Sleep apnea, unspecified: Secondary | ICD-10-CM | POA: Insufficient documentation

## 2015-02-07 HISTORY — PX: HYDRADENITIS EXCISION: SHX5243

## 2015-02-07 LAB — GLUCOSE, CAPILLARY
Glucose-Capillary: 222 mg/dL — ABNORMAL HIGH (ref 70–99)
Glucose-Capillary: 146 mg/dL — ABNORMAL HIGH (ref 70–99)

## 2015-02-07 LAB — POCT HEMOGLOBIN-HEMACUE: HEMOGLOBIN: 10.3 g/dL — AB (ref 12.0–15.0)

## 2015-02-07 SURGERY — EXCISION, HIDRADENITIS, AXILLA
Anesthesia: General | Site: Axilla | Laterality: Right

## 2015-02-07 MED ORDER — FENTANYL CITRATE 0.05 MG/ML IJ SOLN
INTRAMUSCULAR | Status: DC | PRN
  Administered 2015-02-07 (×3): 50 ug via INTRAVENOUS

## 2015-02-07 MED ORDER — FENTANYL CITRATE 0.05 MG/ML IJ SOLN: 50.0000 ug | INTRAMUSCULAR | Status: DC | PRN

## 2015-02-07 MED ORDER — LACTATED RINGERS IV SOLN
INTRAVENOUS | Status: DC
  Administered 2015-02-07 (×2): via INTRAVENOUS

## 2015-02-07 MED ORDER — PROMETHAZINE HCL 25 MG/ML IJ SOLN
6.2500 mg | INTRAMUSCULAR | Status: DC | PRN
  Administered 2015-02-07: 6.25 mg via INTRAVENOUS

## 2015-02-07 MED ORDER — LIDOCAINE HCL (CARDIAC) 20 MG/ML IV SOLN
INTRAVENOUS | Status: DC | PRN
Start: 2015-02-07 — End: 2015-02-07
  Administered 2015-02-07: 100 mg via INTRAVENOUS

## 2015-02-07 MED ORDER — SODIUM CHLORIDE 0.9 % IV SOLN
INTRAVENOUS | Status: DC | PRN
  Administered 2015-02-07: 150 mL via INTRAMUSCULAR

## 2015-02-07 MED ORDER — HYDROMORPHONE HCL 1 MG/ML IJ SOLN
INTRAMUSCULAR | Status: AC
  Filled 2015-02-07: qty 1

## 2015-02-07 MED ORDER — MIDAZOLAM HCL 2 MG/ML PO SYRP: 12.0000 mg | ORAL_SOLUTION | Freq: Once | ORAL | Status: DC | PRN

## 2015-02-07 MED ORDER — HYDROMORPHONE HCL 1 MG/ML IJ SOLN
0.2500 mg | INTRAMUSCULAR | Status: DC | PRN
  Administered 2015-02-07 (×4): 0.5 mg via INTRAVENOUS

## 2015-02-07 MED ORDER — PROMETHAZINE HCL 25 MG/ML IJ SOLN
INTRAMUSCULAR | Status: AC
  Filled 2015-02-07: qty 1

## 2015-02-07 MED ORDER — MIDAZOLAM HCL 5 MG/5ML IJ SOLN
INTRAMUSCULAR | Status: DC | PRN
  Administered 2015-02-07: 2 mg via INTRAVENOUS

## 2015-02-07 MED ORDER — PROPOFOL 10 MG/ML IV BOLUS
INTRAVENOUS | Status: DC | PRN
  Administered 2015-02-07: 200 mg via INTRAVENOUS
  Administered 2015-02-07: 50 mg via INTRAVENOUS

## 2015-02-07 MED ORDER — MIDAZOLAM HCL 2 MG/2ML IJ SOLN: 1.0000 mg | INTRAMUSCULAR | Status: DC | PRN

## 2015-02-07 MED ORDER — MIDAZOLAM HCL 2 MG/2ML IJ SOLN
INTRAMUSCULAR | Status: AC
Start: 2015-02-07 — End: 2015-02-07
  Filled 2015-02-07: qty 2

## 2015-02-07 MED ORDER — SODIUM CHLORIDE 0.9 % IJ SOLN
INTRAMUSCULAR | Status: AC
  Filled 2015-02-07: qty 10

## 2015-02-07 MED ORDER — OXYCODONE HCL 5 MG PO TABS
ORAL_TABLET | ORAL | Status: AC
  Filled 2015-02-07: qty 1

## 2015-02-07 MED ORDER — DEXTROSE 5 % IV SOLN
3.0000 g | INTRAVENOUS | Status: AC
  Administered 2015-02-07: 3 g via INTRAVENOUS

## 2015-02-07 MED ORDER — SCOPOLAMINE 1 MG/3DAYS TD PT72
1.0000 | MEDICATED_PATCH | Freq: Once | TRANSDERMAL | Status: DC
  Administered 2015-02-07: 1.5 mg via TRANSDERMAL

## 2015-02-07 MED ORDER — ONDANSETRON HCL 4 MG/2ML IJ SOLN: 4.0000 mg | Freq: Once | INTRAMUSCULAR | Status: DC | PRN

## 2015-02-07 MED ORDER — FENTANYL CITRATE 0.05 MG/ML IJ SOLN
INTRAMUSCULAR | Status: AC
  Filled 2015-02-07: qty 8

## 2015-02-07 MED ORDER — SCOPOLAMINE 1 MG/3DAYS TD PT72
MEDICATED_PATCH | TRANSDERMAL | Status: AC
  Filled 2015-02-07: qty 1

## 2015-02-07 MED ORDER — OXYCODONE HCL 5 MG PO TABS
5.0000 mg | ORAL_TABLET | ORAL | Status: DC | PRN
  Administered 2015-02-07: 5 mg via ORAL

## 2015-02-07 MED ORDER — MEPERIDINE HCL 25 MG/ML IJ SOLN: 6.2500 mg | INTRAMUSCULAR | Status: DC | PRN

## 2015-02-07 SURGICAL SUPPLY — 59 items
BAG DECANTER FOR FLEXI CONT (MISCELLANEOUS) IMPLANT
BENZOIN TINCTURE PRP APPL 2/3 (GAUZE/BANDAGES/DRESSINGS) IMPLANT
BLADE KNIFE PERSONA 10 (BLADE) ×3 IMPLANT
BLADE KNIFE PERSONA 15 (BLADE) IMPLANT
BNDG COHESIVE 4X5 TAN STRL (GAUZE/BANDAGES/DRESSINGS) ×3 IMPLANT
BRIEF STRETCH FOR OB PAD LRG (UNDERPADS AND DIAPERS) IMPLANT
CANISTER SUCT 1200ML W/VALVE (MISCELLANEOUS) ×3 IMPLANT
COVER BACK TABLE 60X90IN (DRAPES) ×3 IMPLANT
COVER MAYO STAND STRL (DRAPES) ×3 IMPLANT
DECANTER SPIKE VIAL GLASS SM (MISCELLANEOUS) IMPLANT
DRAIN CHANNEL 10M FLAT 3/4 FLT (DRAIN) IMPLANT
DRAIN CHANNEL 7F FF FLAT (WOUND CARE) IMPLANT
DRAPE U-SHAPE 76X120 STRL (DRAPES) ×6 IMPLANT
DRSG PAD ABDOMINAL 8X10 ST (GAUZE/BANDAGES/DRESSINGS) ×3 IMPLANT
DRSG TELFA 3X8 NADH (GAUZE/BANDAGES/DRESSINGS) ×3 IMPLANT
ELECT REM PT RETURN 9FT ADLT (ELECTROSURGICAL) ×3
ELECTRODE REM PT RTRN 9FT ADLT (ELECTROSURGICAL) ×1 IMPLANT
EVACUATOR SILICONE 100CC (DRAIN) IMPLANT
FILTER 7/8 IN (FILTER) ×3 IMPLANT
GAUZE SPONGE 4X4 12PLY STRL (GAUZE/BANDAGES/DRESSINGS) ×3 IMPLANT
GAUZE XEROFORM 5X9 LF (GAUZE/BANDAGES/DRESSINGS) ×3 IMPLANT
GLOVE BIO SURGEON STRL SZ 6.5 (GLOVE) ×2 IMPLANT
GLOVE BIO SURGEONS STRL SZ 6.5 (GLOVE) ×1
GLOVE BIOGEL M STRL SZ7.5 (GLOVE) ×3 IMPLANT
GLOVE BIOGEL PI IND STRL 8 (GLOVE) ×1 IMPLANT
GLOVE BIOGEL PI INDICATOR 8 (GLOVE) ×2
GLOVE ECLIPSE 7.0 STRL STRAW (GLOVE) ×3 IMPLANT
GOWN STRL REUS W/ TWL XL LVL3 (GOWN DISPOSABLE) ×2 IMPLANT
GOWN STRL REUS W/TWL XL LVL3 (GOWN DISPOSABLE) ×4
IV NS 500ML (IV SOLUTION) ×2
IV NS 500ML BAXH (IV SOLUTION) ×1 IMPLANT
NDL SAFETY ECLIPSE 18X1.5 (NEEDLE) IMPLANT
NEEDLE HYPO 18GX1.5 SHARP (NEEDLE)
NEEDLE HYPO 25X1 1.5 SAFETY (NEEDLE) IMPLANT
NEEDLE SPNL 18GX3.5 QUINCKE PK (NEEDLE) IMPLANT
NS IRRIG 1000ML POUR BTL (IV SOLUTION) ×3 IMPLANT
PACK BASIN DAY SURGERY FS (CUSTOM PROCEDURE TRAY) ×3 IMPLANT
PEN SKIN MARKING BROAD TIP (MISCELLANEOUS) ×3 IMPLANT
PIN SAFETY STERILE (MISCELLANEOUS) IMPLANT
SHEET MEDIUM DRAPE 40X70 STRL (DRAPES) IMPLANT
SLEEVE SCD COMPRESS KNEE MED (MISCELLANEOUS) ×3 IMPLANT
SPONGE LAP 18X18 X RAY DECT (DISPOSABLE) ×6 IMPLANT
STAPLER VISISTAT 35W (STAPLE) IMPLANT
STOCKINETTE IMPERVIOUS LG (DRAPES) ×3 IMPLANT
STRIP SUTURE WOUND CLOSURE 1/2 (SUTURE) ×3 IMPLANT
SUT ETHILON 3 0 FSL (SUTURE) IMPLANT
SUT MNCRL AB 3-0 PS2 18 (SUTURE) ×3 IMPLANT
SUT MON AB 2-0 CT1 36 (SUTURE) ×3 IMPLANT
SUT PROLENE 4 0 P 3 18 (SUTURE) IMPLANT
SYR 20CC LL (SYRINGE) IMPLANT
SYR 50ML LL SCALE MARK (SYRINGE) ×6 IMPLANT
SYR CONTROL 10ML LL (SYRINGE) ×3 IMPLANT
TOWEL OR 17X24 6PK STRL BLUE (TOWEL DISPOSABLE) ×9 IMPLANT
TRAY DSU PREP LF (CUSTOM PROCEDURE TRAY) ×3 IMPLANT
TUBE CONNECTING 20'X1/4 (TUBING) ×1
TUBE CONNECTING 20X1/4 (TUBING) ×2 IMPLANT
UNDERPAD 30X30 INCONTINENT (UNDERPADS AND DIAPERS) ×3 IMPLANT
VAC PENCILS W/TUBING CLEAR (MISCELLANEOUS) ×3 IMPLANT
YANKAUER SUCT BULB TIP NO VENT (SUCTIONS) ×3 IMPLANT

## 2015-02-07 NOTE — Transfer of Care (Signed)
Immediate Anesthesia Transfer of Care Note  Patient: Sharon Gregory  Procedure(s) Performed: Procedure(s): EXCISION HIDRADENITIS RIGHT AXILLA WITH RYAN POLLOCK CLOSURE (Right)  Patient Location: PACU  Anesthesia Type:General  Level of Consciousness: sedated and patient cooperative  Airway & Oxygen Therapy: Patient Spontanous Breathing and Patient connected to face mask oxygen  Post-op Assessment: Report given to RN and Post -op Vital signs reviewed and stable  Post vital signs: Reviewed and stable  Last Vitals:  Filed Vitals:   02/07/15 0914  BP: 125/96  Pulse: 98  Temp: 36.8 C  Resp: 16    Complications: No apparent anesthesia complications

## 2015-02-07 NOTE — Discharge Instructions (Signed)
°  Medications: (NO ASPIRIN!!!)  Activity: As tolerated:  NO showers NO Driving NO Heavy Lifting activities  Diet: No restrictions  Wound Care: Keep dressings clean and dry Do NOT change dressings  Special Instructions: Do NOT raise arm up Call Doctor if any unusual problems occur such as Pain, excessive Bleeding, unrelieved Nausea/Vomiting, Fever &/or Chills  Follow-up appointment: Please call the office.   Post Anesthesia Home Care Instructions  Activity: Get plenty of rest for the remainder of the day. A responsible adult should stay with you for 24 hours following the procedure.  For the next 24 hours, DO NOT: -Drive a car -Advertising copywriterperate machinery -Drink alcoholic beverages -Take any medication unless instructed by your physician -Make any legal decisions or sign important papers.  Meals: Start with liquid foods such as gelatin or soup. Progress to regular foods as tolerated. Avoid greasy, spicy, heavy foods. If nausea and/or vomiting occur, drink only clear liquids until the nausea and/or vomiting subsides. Call your physician if vomiting continues.  Special Instructions/Symptoms: Your throat may feel dry or sore from the anesthesia or the breathing tube placed in your throat during surgery. If this causes discomfort, gargle with warm salt water. The discomfort should disappear within 24 hours.

## 2015-02-07 NOTE — Anesthesia Preprocedure Evaluation (Signed)
Anesthesia Evaluation  Patient identified by MRN, date of birth, ID band Patient awake    Reviewed: Allergy & Precautions, NPO status , Patient's Chart, lab work & pertinent test results  History of Anesthesia Complications (+) PONV  Airway Mallampati: II  TM Distance: >3 FB Neck ROM: Full    Dental   Pulmonary former smoker,          Cardiovascular     Neuro/Psych    GI/Hepatic GERD-  Medicated and Controlled,  Endo/Other  diabetes, Type 2, Insulin Dependent  Renal/GU      Musculoskeletal   Abdominal   Peds  Hematology   Anesthesia Other Findings   Reproductive/Obstetrics                             Anesthesia Physical Anesthesia Plan  ASA: II  Anesthesia Plan: General   Post-op Pain Management:    Induction: Intravenous  Airway Management Planned: LMA  Additional Equipment:   Intra-op Plan:   Post-operative Plan: Extubation in OR  Informed Consent: I have reviewed the patients History and Physical, chart, labs and discussed the procedure including the risks, benefits and alternatives for the proposed anesthesia with the patient or authorized representative who has indicated his/her understanding and acceptance.     Plan Discussed with: CRNA and Surgeon  Anesthesia Plan Comments:         Anesthesia Quick Evaluation

## 2015-02-07 NOTE — Anesthesia Postprocedure Evaluation (Signed)
Anesthesia Post Note  Patient: Sharon Gregory  Procedure(s) Performed: Procedure(s) (LRB): EXCISION HIDRADENITIS RIGHT AXILLA WITH RYAN POLLOCK CLOSURE (Right)  Anesthesia type: General  Patient location: PACU  Post pain: Pain level controlled and Adequate analgesia  Post assessment: Post-op Vital signs reviewed, Patient's Cardiovascular Status Stable, Respiratory Function Stable, Patent Airway and Pain level controlled  Last Vitals:  Filed Vitals:   02/07/15 1245  BP: 112/75  Pulse: 98  Temp:   Resp: 13    Post vital signs: Reviewed and stable  Level of consciousness: awake, alert  and oriented  Complications: No apparent anesthesia complications
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
## 2015-02-08 ENCOUNTER — Encounter (HOSPITAL_BASED_OUTPATIENT_CLINIC_OR_DEPARTMENT_OTHER): Payer: Self-pay | Admitting: Specialist

## 2015-02-08 NOTE — Op Note (Signed)
NAMBeverly Gust:  Dingwall, Khristi              ACCOUNT NO.:  [id number]  MEDICAL RECORD NO.:  [id number]  LOCATION:                                 FACILITY:  PHYSICIAN:  Earvin HansenGerald L. Arlin Sass, M.D.DATE OF BIRTH:  1981-07-21  DATE OF PROCEDURE:  02/07/2015 DATE OF DISCHARGE:  02/07/2015                              OPERATIVE REPORT   INDICATIONS:  A 34 year old lady who has a history of hidradenitis suppurativa involving the right and left axillary regions, has had the left side done previously, now is ready for the right side.  PROCEDURES:  Planned excision of the hidradenitis suppurativa, closure of flap with a Estill Battenyan Pollack rotational flap coverage.  ANESTHESIA:  General.  DESCRIPTION OF PROCEDURE:  The patient underwent general anesthesia, intubated orally, prep was done to the right axillary area and the arm and chest using Hibiclens soap and solution walled off with sterile towels and drapes, so as to make a sterile field.  A 0.25% of Xylocaine with epinephrine was injected locally, a total of 200 mL.  This was allowed to set up and then the markings previously done were used to excise the borders of the large paddle of skin under the right axilla, removing all hair-bearing areas down to the underlying superficial fascia.  Next, the dissection was complete after proper hemostasis.  The superior, medial, and lateral flaps were freed up significantly approximately 3 inches on each side.  After proper hemostasis, the flaps were rotated and stayed in using deep sutures of 2-0 Monocryl x2 layers, subcutaneous plane and subdermal suture of 3-0 Monocryl and then a running subcuticular stitch of 3-0 Monocryl.  Half-inch Steri-Strips and soft dressing applied all the areas.  Complications none.  Estimated blood loss less than 100 mL.     Yaakov GuthrieGerald L. Shon Houghruesdale, M.D.     Cathie HoopsGLT/MEDQ  D:  02/07/2015  T:  02/08/2015  Job:  [id number]

## 2015-02-10 DIAGNOSIS — Z0289 Encounter for other administrative examinations: Secondary | ICD-10-CM

## 2015-06-28 ENCOUNTER — Ambulatory Visit (INDEPENDENT_AMBULATORY_CARE_PROVIDER_SITE_OTHER): Payer: PRIVATE HEALTH INSURANCE | Admitting: Diagnostic Neuroimaging

## 2015-06-28 ENCOUNTER — Encounter: Payer: Self-pay | Admitting: Diagnostic Neuroimaging

## 2015-06-28 VITALS — BP 130/84 | HR 100 | Ht 64.0 in | Wt 256.0 lb

## 2015-06-28 DIAGNOSIS — G44329 Chronic post-traumatic headache, not intractable: Secondary | ICD-10-CM

## 2015-06-28 DIAGNOSIS — F0781 Postconcussional syndrome: Secondary | ICD-10-CM | POA: Diagnosis not present

## 2015-06-28 DIAGNOSIS — F4310 Post-traumatic stress disorder, unspecified: Secondary | ICD-10-CM | POA: Diagnosis not present

## 2015-06-28 MED ORDER — TOPIRAMATE 100 MG PO TABS: 100.0000 mg | ORAL_TABLET | Freq: Two times a day (BID) | ORAL | Status: DC

## 2015-06-28 NOTE — Patient Instructions (Signed)
Continue topiramate. 
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
## 2016-01-02 ENCOUNTER — Ambulatory Visit (INDEPENDENT_AMBULATORY_CARE_PROVIDER_SITE_OTHER): Payer: PRIVATE HEALTH INSURANCE | Admitting: Diagnostic Neuroimaging

## 2016-01-02 ENCOUNTER — Encounter: Payer: Self-pay | Admitting: Diagnostic Neuroimaging

## 2016-01-02 VITALS — BP 122/78 | HR 86 | Ht 64.0 in | Wt 258.0 lb

## 2016-01-02 DIAGNOSIS — F411 Generalized anxiety disorder: Secondary | ICD-10-CM

## 2016-01-02 DIAGNOSIS — F4310 Post-traumatic stress disorder, unspecified: Secondary | ICD-10-CM

## 2016-01-02 DIAGNOSIS — G44321 Chronic post-traumatic headache, intractable: Secondary | ICD-10-CM

## 2016-01-02 MED ORDER — TOPIRAMATE 100 MG PO TABS: 100.0000 mg | ORAL_TABLET | Freq: Two times a day (BID) | ORAL | Status: DC

## 2016-01-02 NOTE — Patient Instructions (Signed)
Thank you for coming to see [place] at [facility]. I hope we have been able to provide you high quality care today.  You may receive a patient satisfaction survey over the next few weeks. We would appreciate your feedback and comments so that we may continue to improve ourselves and the health of our patients.  - continue topiramate - restart CPAP for sleep apnea   ~~~~~~~~~~~~~~~~~~~~~~~~~~~~~~~~~~~~~~~~~~~~~~~~~~~~~~~~~~~~~~~~~  DR. Laurette Villescas'S GUIDE TO HAPPY AND HEALTHY LIVING These are some of my general health and wellness recommendations. Some of them may apply to you better than others. Please use common sense as you try these suggestions and feel free to ask me any questions.   ACTIVITY/FITNESS Mental, social, emotional and physical stimulation are very important for brain and body health. Try learning a new activity (arts, music, language, sports, games).  Keep moving your body to the best of your abilities. You can do this at home, inside or outside, the park, community center, gym or anywhere you like. Consider a physical therapist or personal trainer to get started. Consider the app Sworkit. Fitness trackers such as smart-watches, smart-phones or Fitbits can help as well.   NUTRITION Eat more plants: colorful vegetables, nuts, seeds and berries.  Eat less sugar, salt, preservatives and processed foods.  Avoid toxins such as cigarettes and alcohol.  Drink water when you are thirsty. Warm water with a slice of lemon is an excellent morning drink to start the day.  Consider these websites for more information The Nutrition Source (https://www.henry-hernandez.biz/) Precision Nutrition (WindowBlog.ch)   RELAXATION Consider practicing mindfulness meditation or other relaxation techniques such as deep breathing, prayer, yoga, tai chi, massage. See website mindful.org or the apps Headspace or Calm to help get  started.   SLEEP Try to get at least 7-8+ hours sleep per day. Regular exercise and reduced caffeine will help you sleep better. Practice good sleep hygeine techniques. See website sleep.org for more information.   PLANNING Prepare estate planning, living will, healthcare POA documents. Sometimes this is best planned with the help of an attorney. Theconversationproject.org and agingwithdignity.org are excellent resources.
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
## 2016-01-25 ENCOUNTER — Ambulatory Visit (HOSPITAL_BASED_OUTPATIENT_CLINIC_OR_DEPARTMENT_OTHER): Payer: Managed Care, Other (non HMO) | Attending: Internal Medicine | Admitting: Radiology

## 2016-01-25 VITALS — Ht 64.0 in | Wt 249.0 lb

## 2016-01-25 DIAGNOSIS — R0683 Snoring: Secondary | ICD-10-CM | POA: Insufficient documentation

## 2016-01-25 DIAGNOSIS — E669 Obesity, unspecified: Secondary | ICD-10-CM | POA: Diagnosis not present

## 2016-01-25 DIAGNOSIS — Z794 Long term (current) use of insulin: Secondary | ICD-10-CM | POA: Diagnosis not present

## 2016-01-25 DIAGNOSIS — Z79899 Other long term (current) drug therapy: Secondary | ICD-10-CM | POA: Insufficient documentation

## 2016-01-25 DIAGNOSIS — J449 Chronic obstructive pulmonary disease, unspecified: Secondary | ICD-10-CM | POA: Insufficient documentation

## 2016-01-25 DIAGNOSIS — Z6841 Body Mass Index (BMI) 40.0 and over, adult: Secondary | ICD-10-CM | POA: Diagnosis not present

## 2016-01-25 DIAGNOSIS — G4733 Obstructive sleep apnea (adult) (pediatric): Secondary | ICD-10-CM | POA: Diagnosis not present

## 2016-01-25 DIAGNOSIS — R51 Headache: Secondary | ICD-10-CM | POA: Diagnosis not present

## 2016-01-29 DIAGNOSIS — G4733 Obstructive sleep apnea (adult) (pediatric): Secondary | ICD-10-CM | POA: Diagnosis not present

## 2016-01-29 NOTE — Progress Notes (Signed)
  Patient Name: Sharon Gregory, Sharon Gregory Date: 01/25/2016 Gender: Female D.O.B: 1981/01/15 Age (years): 34 Referring Provider: Fleet Contras Height (inches): 64 Interpreting Physician: Jetty Duhamel MD, ABSM Weight (lbs): 249 RPSGT: Ulyess Mort BMI: 43 MRN: [id number] Neck Size: 15.50 CLINICAL INFORMATION Sleep Study Type: NPSG   Indication for sleep study: COPD, Morning Headaches, Obesity, OSA   Epworth Sleepiness Score:  1/24    Most recent polysomnogram dated 05/20/2012 revealed an AHI of 14.9/h. Most recent titration study dated 05/20/2012 was optimal at 16cm H2O with an AHI of 0/h.  SLEEP STUDY TECHNIQUE As per the AASM Manual for the Scoring of Sleep and Associated Events v2.3 (April 2016) with a hypopnea requiring 4% desaturations. The channels recorded and monitored were frontal, central and occipital EEG, electrooculogram (EOG), submentalis EMG (chin), nasal and oral airflow, thoracic and abdominal wall motion, anterior tibialis EMG, snore microphone, electrocardiogram, and pulse oximetry.  MEDICATIONS Patient's medications include: charted for review Medications self-administered by patient during sleep study : TOPIRAMATE, LANTUS, CELEXA, PRAZOSIN.  SLEEP ARCHITECTURE The study was initiated at 10:41:26 PM and ended at 5:14:28 AM. Sleep onset time was 44.2 minutes and the sleep efficiency was 66.7%. The total sleep time was 262.0 minutes. Stage REM latency was 145.0 minutes. The patient spent 14.69% of the night in stage N1 sleep, 83.97% in stage N2 sleep, 0.95% in stage N3 and 0.38% in REM. Alpha intrusion was absent. Supine sleep was 27.85%. Wake after sleep onset  86 minutes  RESPIRATORY PARAMETERS The overall apnea/hypopnea index (AHI) was 10.5 per hour. There were 2 total apneas, including 2 obstructive, 0 central and 0 mixed apneas. There were 44 hypopneas and 44 RERAs. The AHI during Stage REM sleep was 0.0 per hour. AHI while supine was 34.5 per  hour. The mean oxygen saturation was 93.39%. The minimum SpO2 during sleep was 86.00%. Moderate snoring was noted during this study.  CARDIAC DATA The 2 lead EKG demonstrated sinus rhythm. The mean heart rate was 78.84 beats per minute. Other EKG findings include: None.  LEG MOVEMENT DATA The total PLMS were 0 with a resulting PLMS index of 0.00. Associated arousal with leg movement index was 0.0 .  IMPRESSIONS - Mild obstructive sleep apnea occurred during this study (AHI = 10.5/h). - There were not enough early events to meet split CPAP titration protocol requirements. - No significant central sleep apnea occurred during this study (CAI = 0.0/h). - Mild oxygen desaturation was noted during this study (Min O2 = 86.00%). - The patient snored with Moderate snoring volume. - No cardiac abnormalities were noted during this study. - Clinically significant periodic limb movements did not occur during sleep. No significant associated arousals.  DIAGNOSIS - Obstructive Sleep Apnea (327.23 [G47.33 ICD-10])  RECOMMENDATIONS - Therapeutic CPAP titration to determine optimal pressure required to alleviate sleep disordered breathing. - Alternatives based on clinical judgment might include a fitted oral appliance or ENT surgical evaluation. - Positional therapy avoiding supine position during sleep. - Avoid alcohol, sedatives and other CNS depressants that may worsen sleep apnea and disrupt normal sleep architecture. - Sleep hygiene should be reviewed to assess factors that may improve sleep quality. - Weight management and regular exercise should be initiated or continued if appropriate.  Waymon Budge Diplomate, American Board of Sleep Medicine  ELECTRONICALLY SIGNED ON:  01/29/2016, 4:32 PM  [facility] PH: [phone]   FX: [phone] ACCREDITED BY THE AMERICAN ACADEMY OF SLEEP MEDICINE

## 2016-03-07 ENCOUNTER — Ambulatory Visit (HOSPITAL_BASED_OUTPATIENT_CLINIC_OR_DEPARTMENT_OTHER): Payer: Managed Care, Other (non HMO)

## 2017-01-04 ENCOUNTER — Ambulatory Visit: Payer: Managed Care, Other (non HMO) | Admitting: Diagnostic Neuroimaging

## 2019-03-05 ENCOUNTER — Encounter (HOSPITAL_COMMUNITY): Payer: Self-pay | Admitting: Emergency Medicine

## 2019-03-05 ENCOUNTER — Other Ambulatory Visit: Payer: Self-pay

## 2019-03-05 ENCOUNTER — Emergency Department (HOSPITAL_COMMUNITY)
Admission: EM | Admit: 2019-03-05 | Discharge: 2019-03-05 | Disposition: A | Discharge status: Home / Self Care | Payer: Managed Care, Other (non HMO) | Attending: Emergency Medicine | Admitting: Emergency Medicine

## 2019-03-05 DIAGNOSIS — R109 Unspecified abdominal pain: Secondary | ICD-10-CM | POA: Diagnosis not present

## 2019-03-05 DIAGNOSIS — Z79899 Other long term (current) drug therapy: Secondary | ICD-10-CM | POA: Insufficient documentation

## 2019-03-05 DIAGNOSIS — J45909 Unspecified asthma, uncomplicated: Secondary | ICD-10-CM | POA: Insufficient documentation

## 2019-03-05 DIAGNOSIS — E1165 Type 2 diabetes mellitus with hyperglycemia: Secondary | ICD-10-CM | POA: Insufficient documentation

## 2019-03-05 DIAGNOSIS — Z794 Long term (current) use of insulin: Secondary | ICD-10-CM | POA: Insufficient documentation

## 2019-03-05 DIAGNOSIS — Z87891 Personal history of nicotine dependence: Secondary | ICD-10-CM | POA: Insufficient documentation

## 2019-03-05 DIAGNOSIS — E860 Dehydration: Secondary | ICD-10-CM | POA: Diagnosis not present

## 2019-03-05 DIAGNOSIS — R739 Hyperglycemia, unspecified: Secondary | ICD-10-CM

## 2019-03-05 LAB — CBC WITH DIFFERENTIAL/PLATELET
Abs Immature Granulocytes: 0.02 10*3/uL (ref 0.00–0.07)
Basophils Absolute: 0 10*3/uL (ref 0.0–0.1)
Basophils Relative: 0 %
EOS ABS: 0.2 10*3/uL (ref 0.0–0.5)
Eosinophils Relative: 2 %
HCT: 41.3 % (ref 36.0–46.0)
Hemoglobin: 13.7 g/dL (ref 12.0–15.0)
Immature Granulocytes: 0 %
Lymphocytes Relative: 45 %
Lymphs Abs: 3.5 10*3/uL (ref 0.7–4.0)
MCH: 27.6 pg (ref 26.0–34.0)
MCHC: 33.2 g/dL (ref 30.0–36.0)
MCV: 83.1 fL (ref 80.0–100.0)
Monocytes Absolute: 0.6 10*3/uL (ref 0.1–1.0)
Monocytes Relative: 8 %
NEUTROS PCT: 45 %
Neutro Abs: 3.6 10*3/uL (ref 1.7–7.7)
Platelets: 337 10*3/uL (ref 150–400)
RBC: 4.97 MIL/uL (ref 3.87–5.11)
RDW: 11.9 % (ref 11.5–15.5)
WBC: 7.8 10*3/uL (ref 4.0–10.5)
nRBC: 0 % (ref 0.0–0.2)

## 2019-03-05 LAB — CBG MONITORING, ED
Glucose-Capillary: 596 mg/dL (ref 70–99)
Glucose-Capillary: 379 mg/dL — ABNORMAL HIGH (ref 70–99)

## 2019-03-05 LAB — URINALYSIS, ROUTINE W REFLEX MICROSCOPIC
Bacteria, UA: NONE SEEN
Bilirubin Urine: NEGATIVE
Glucose, UA: >=500 mg/dL — AB
Hgb urine dipstick: NEGATIVE
Ketones, ur: 5 mg/dL — AB
LEUKOCYTE UA: NEGATIVE
Nitrite: POSITIVE — AB
Protein, ur: 30 mg/dL — AB
Specific Gravity, Urine: 1.031 — ABNORMAL HIGH (ref 1.005–1.030)
pH: 6 (ref 5.0–8.0)

## 2019-03-05 LAB — BASIC METABOLIC PANEL
Anion gap: 14 (ref 5–15)
BUN: 13 mg/dL (ref 6–20)
CO2: 20 mmol/L — ABNORMAL LOW (ref 22–32)
Calcium: 9.4 mg/dL (ref 8.9–10.3)
Chloride: 94 mmol/L — ABNORMAL LOW (ref 98–111)
Creatinine, Ser: 0.94 mg/dL (ref 0.44–1.00)
GFR calc Af Amer: >60 mL/min (ref >60)
GFR calc non Af Amer: >60 mL/min (ref >60)
Glucose, Bld: 565 mg/dL (ref 70–99)
POTASSIUM: 4.4 mmol/L (ref 3.5–5.1)
Sodium: 128 mmol/L — ABNORMAL LOW (ref 135–145)

## 2019-03-05 MED ORDER — CYCLOBENZAPRINE HCL 10 MG PO TABS: 10.0000 mg | ORAL_TABLET | Freq: Three times a day (TID) | ORAL | 0 refills | Status: DC | PRN

## 2019-03-05 MED ORDER — FENTANYL CITRATE (PF) 100 MCG/2ML IJ SOLN
100.0000 ug | Freq: Once | INTRAMUSCULAR | Status: AC
  Administered 2019-03-05: 100 ug via INTRAVENOUS
  Filled 2019-03-05: qty 2

## 2019-03-05 MED ORDER — CEPHALEXIN 250 MG PO CAPS
500.0000 mg | ORAL_CAPSULE | Freq: Once | ORAL | Status: AC
  Administered 2019-03-05: 500 mg via ORAL
  Filled 2019-03-05: qty 2

## 2019-03-05 MED ORDER — CEPHALEXIN 500 MG PO CAPS: 500.0000 mg | ORAL_CAPSULE | Freq: Two times a day (BID) | ORAL | 0 refills | Status: AC

## 2019-03-05 MED ORDER — SODIUM CHLORIDE 0.9 % IV BOLUS (SEPSIS)
1000.0000 mL | Freq: Once | INTRAVENOUS | Status: AC
  Administered 2019-03-05: 1000 mL via INTRAVENOUS

## 2019-03-05 MED ORDER — IBUPROFEN 600 MG PO TABS: 600.0000 mg | ORAL_TABLET | Freq: Three times a day (TID) | ORAL | 0 refills | Status: DC | PRN

## 2019-03-05 MED ORDER — INSULIN ASPART 100 UNIT/ML ~~LOC~~ SOLN
5.0000 [IU] | Freq: Once | SUBCUTANEOUS | Status: AC
  Administered 2019-03-05: 5 [IU] via SUBCUTANEOUS

## 2019-03-05 MED ORDER — ONDANSETRON HCL 4 MG/2ML IJ SOLN
4.0000 mg | Freq: Once | INTRAMUSCULAR | Status: AC
  Administered 2019-03-05: 4 mg via INTRAVENOUS
  Filled 2019-03-05: qty 2

## 2019-03-05 MED ORDER — KETOROLAC TROMETHAMINE 15 MG/ML IJ SOLN
15.0000 mg | Freq: Once | INTRAMUSCULAR | Status: AC
  Administered 2019-03-05: 15 mg via INTRAVENOUS
  Filled 2019-03-05: qty 1

## 2019-03-05 MED ORDER — FLUCONAZOLE 150 MG PO TABS: 150.0000 mg | ORAL_TABLET | ORAL | 0 refills | Status: AC

## 2019-03-05 NOTE — ED Provider Notes (Signed)
Care transferred to me. Patient states the pain is coming back though not as bad as before. She has had frequent urination but urinating less each time. Normally with hyperglycemia it's increased frequency and urine output for her. Thus, could be UTI/pyelo. Exam shows diffuse back tenderness, and while it's worst on her sides, it's also in midline and over more than just CVA. It is most likely muscular. Doubt kidney stone, especially with bilateral symptoms and no blood in urine. She took azo last night, which could be causing the nitrites in urine. However with her symptoms, I think it's reasonable to cover for urinary infection and send culture. Labs not c/w DKA. Will give toradol. She has been given fluids and insulin. Will add on muscle relaxer at home and give keflex. I doubt acute spinal emergency, and don't think emergent CT would be necessary.  We discussed return precautions.   Pricilla Loveless, MD 03/05/19 [phone]

## 2019-03-05 NOTE — ED Triage Notes (Signed)
CBG 596, MD in room and aware

## 2019-03-05 NOTE — ED Triage Notes (Signed)
Pt coming by POV with complaints of bilateral flank pain, right side groin pain, increased urination, chills, and nausea for 5 days. Pt has taken all of home meds except for her insulin yesterday due to feeling sick.
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
## 2019-03-05 NOTE — Discharge Instructions (Addendum)
If you develop worsening, recurrent, or continued back pain, numbness or weakness in the legs, incontinence of your bowels or bladders, numbness of your buttocks, fever, abdominal pain, or any other new/concerning symptoms then return to the ER for evaluation.   You are being prescribed ibuprofen.  Do not take ibuprofen, Advil, Aleve, Motrin, naproxen, Naprosyn, or other NSAIDs.  You are being given Flexeril, which is a muscle relaxer.  This can cause you to be sleepy, off balance, dizzy, or have other side effects.  Do not drive or operate heavy machinery while taking this and do not combine with alcohol or other medicines.

## 2019-03-06 LAB — URINE CULTURE: CULTURE: NO GROWTH

## 2020-01-19 ENCOUNTER — Other Ambulatory Visit (HOSPITAL_COMMUNITY): Payer: Self-pay | Admitting: Internal Medicine

## 2020-01-20 ENCOUNTER — Other Ambulatory Visit: Payer: Self-pay | Admitting: Internal Medicine

## 2020-01-20 DIAGNOSIS — Z1231 Encounter for screening mammogram for malignant neoplasm of breast: Secondary | ICD-10-CM

## 2020-02-09 ENCOUNTER — Other Ambulatory Visit (HOSPITAL_COMMUNITY): Payer: Self-pay | Admitting: Internal Medicine

## 2020-02-18 ENCOUNTER — Ambulatory Visit: Payer: Managed Care, Other (non HMO)

## 2020-03-04 ENCOUNTER — Ambulatory Visit: Payer: Managed Care, Other (non HMO) | Admitting: Allergy

## 2020-03-04 ENCOUNTER — Other Ambulatory Visit: Payer: Self-pay

## 2020-03-04 ENCOUNTER — Encounter: Payer: Self-pay | Admitting: Allergy

## 2020-03-04 VITALS — BP 128/84 | HR 96 | Temp 97.3°F | Resp 18 | Ht 64.75 in | Wt 272.6 lb

## 2020-03-04 DIAGNOSIS — H1013 Acute atopic conjunctivitis, bilateral: Secondary | ICD-10-CM

## 2020-03-04 DIAGNOSIS — Z872 Personal history of diseases of the skin and subcutaneous tissue: Secondary | ICD-10-CM

## 2020-03-04 DIAGNOSIS — J449 Chronic obstructive pulmonary disease, unspecified: Secondary | ICD-10-CM | POA: Diagnosis not present

## 2020-03-04 DIAGNOSIS — T7800XA Anaphylactic reaction due to unspecified food, initial encounter: Secondary | ICD-10-CM | POA: Diagnosis not present

## 2020-03-04 DIAGNOSIS — K2100 Gastro-esophageal reflux disease with esophagitis, without bleeding: Secondary | ICD-10-CM

## 2020-03-04 DIAGNOSIS — J3089 Other allergic rhinitis: Secondary | ICD-10-CM

## 2020-03-04 MED ORDER — EPINEPHRINE 0.3 MG/0.3ML IJ SOAJ: 0.3000 mg | Freq: Once | INTRAMUSCULAR | 2 refills | Status: AC

## 2020-03-04 MED ORDER — AZELASTINE-FLUTICASONE 137-50 MCG/ACT NA SUSP: 2.0000 | NASAL | 3 refills | Status: DC

## 2020-03-04 MED ORDER — DULERA 100-5 MCG/ACT IN AERO: 2.0000 | INHALATION_SPRAY | Freq: Two times a day (BID) | RESPIRATORY_TRACT | 5 refills | Status: DC

## 2020-03-04 NOTE — Patient Instructions (Addendum)
Adverse food effect  -Select food allergy skin testing today is negative to tomato, cucumber, grape, strawberry, apple, pineapple, orange.  Will obtain serum IgE levels for tomato, cucumber, grape and strawberry.    The apple, pineapple, orange and berries most likely represent OAS (as below).    -Continue avoidance of the above foods until labs return.  If negative or low then will recommend in-office food challenge.    - have access to self-injectable epinephrine (Epipen or AuviQ) 0.3mg  at all times  - follow emergency action plan in case of allergic reaction   -We have discussed today pollen food allergy syndrome or oral allergy syndrome.   The oral allergy syndrome (OAS) or pollen-food allergy syndrome (PFAS) is a relatively common form of food allergy, particularly in adults. It typically occurs in people who have pollen allergies when the immune system "sees" proteins on the food that look like proteins on the pollen. This results in the allergy antibody (IgE) binding to the food instead of the pollen. Patients typically report itching and/or mild swelling of the mouth and throat immediately following ingestion of certain uncooked fruits (including nuts) or raw vegetables. Only a very small number of affected individuals experience systemic allergic reactions, such as anaphylaxis which occurs with true food allergies.   See chart below  Allergies  -Environmental allergies skin testing today is positive to grass pollens, tree pollens, dust mites, cat.    -Allergen avoidance measures discussed/handouts provided.  -For generalized allergy symptoms continue Xyzal 5 mg daily as needed  -For nasal congestion and drainage recommend trial dymista 1 spray each nostril twice a day.  This is a combination nasal spray with Flonase + Astelin (nasal antihistamine).  This helps with both nasal congestion and drainage.   -For itchy watery eyes use over-the-counter Pataday 1 drop each eye daily as needed  -Allergen immunotherapy discussed today including protocol, benefits and risk.  Informational handout provided.  If interested in this therapuetic option you can check with your insurance carrier for coverage.  Let us know if you would like to proceed with this option.  This may help you to be able to tolerate the fruits as below.  Asthma with COPD overlap  - have access to albuterol inhaler 2 puffs or albuterol 1 vial via nebulizer every 4-6 hours as needed for cough/wheeze/shortness of breath/chest tightness.  May use 15-20 minutes prior to activity.   Monitor frequency of use.    -Recommend resume use of Dulera 2 puffs twice a day  Reflux  -Continue Nexium daily  History of eczema  -Continue daily moisturization with Cetaphil  Follow-up 4-6 months or sooner if needed
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
## 2020-03-07 ENCOUNTER — Telehealth: Payer: Self-pay | Admitting: *Deleted

## 2020-03-07 ENCOUNTER — Other Ambulatory Visit: Payer: Self-pay | Admitting: *Deleted

## 2020-03-07 NOTE — Telephone Encounter (Signed)
Pharmacy sent fax stating that Sharon Gregory is not covered. Advair, Breo, and Trelegy,and Spiriva are on the formulary and seem to have better coverage. Please advise possible change in medication.

## 2020-03-08 LAB — ALLERGEN PANEL, FOOD-BERRY
Allergen Blueberry IgE: <0.1 kU/L
Allergen Strawberry IgE: <0.1 kU/L
F343-IgE Raspberry: <0.1 kU/L

## 2020-03-08 LAB — TRYPTASE: Tryptase: 5.3 ug/L (ref 2.2–13.2)

## 2020-03-08 LAB — ALLERGEN, TOMATO F25: Allergen Tomato, IgE: 0.51 kU/L — AB

## 2020-03-08 LAB — ALLERGEN, CUCUMBER, F244: Allergen Cucumber IgE: 2.13 kU/L — AB

## 2020-03-08 LAB — ALLERGEN GRAPE F259: Allergen Grape IgE: <0.1 kU/L

## 2020-03-08 MED ORDER — BREO ELLIPTA 100-25 MCG/INH IN AEPB: 1.0000 | INHALATION_SPRAY | Freq: Every day | RESPIRATORY_TRACT | 5 refills | Status: DC

## 2020-03-08 NOTE — Telephone Encounter (Signed)
Ok, lets try Breo 1 puff once a day

## 2020-03-08 NOTE — Telephone Encounter (Signed)
Called patient and advised of medication change. Patient verbalized understanding. Medication has been sent to [facility] on [address].

## 2020-03-08 NOTE — Addendum Note (Signed)
Addended by: Dollene Cleveland R on: 03/08/2020 09:28 AM   Modules accepted: Orders

## 2020-03-08 NOTE — Addendum Note (Signed)
Addended by: Dollene Cleveland R on: 03/08/2020 09:33 AM   Modules accepted: Orders

## 2020-03-11 ENCOUNTER — Other Ambulatory Visit: Payer: Self-pay

## 2020-03-11 ENCOUNTER — Ambulatory Visit
Admission: RE | Admit: 2020-03-11 | Discharge: 2020-03-11 | Disposition: A | Discharge status: Home / Self Care | Payer: Managed Care, Other (non HMO) | Source: Ambulatory Visit | Attending: Internal Medicine | Admitting: Internal Medicine

## 2020-03-11 DIAGNOSIS — Z1231 Encounter for screening mammogram for malignant neoplasm of breast: Secondary | ICD-10-CM

## 2020-04-15 ENCOUNTER — Other Ambulatory Visit: Payer: Self-pay | Admitting: *Deleted

## 2020-04-15 MED ORDER — BREO ELLIPTA 100-25 MCG/INH IN AEPB: INHALATION_SPRAY | RESPIRATORY_TRACT | 0 refills | Status: DC

## 2020-06-09 ENCOUNTER — Other Ambulatory Visit (HOSPITAL_COMMUNITY): Payer: Self-pay | Admitting: Internal Medicine

## 2020-07-14 ENCOUNTER — Other Ambulatory Visit (HOSPITAL_COMMUNITY): Payer: Self-pay | Admitting: Internal Medicine

## 2020-09-09 ENCOUNTER — Ambulatory Visit: Payer: Managed Care, Other (non HMO) | Admitting: Allergy

## 2020-09-23 MED FILL — UNIFINE PENTIPS 31GX3/16: 31G X 5 MM | 90 days supply | Qty: 300 | Fill #0

## 2020-09-23 MED FILL — ZOLPIDEM TARTRATE 10 MG TAB: 10 | 30 days supply | Qty: 30 | Fill #0

## 2020-09-23 MED FILL — tiZANidine HCL 4 MG TABS: 4 | 15 days supply | Qty: 30 | Fill #0

## 2020-10-28 ENCOUNTER — Other Ambulatory Visit (HOSPITAL_COMMUNITY): Payer: Self-pay | Admitting: Internal Medicine

## 2020-10-28 MED FILL — tiZANidine HCL 4 MG TABS: 4 | 15 days supply | Qty: 30 | Fill #0

## 2020-10-28 MED FILL — ZOLPIDEM TARTRATE 10 MG TAB: 10 | 30 days supply | Qty: 30 | Fill #1

## 2020-11-17 MED FILL — POTASSIUM CL ER 10 MEQ TAB: 10 | 90 days supply | Qty: 90 | Fill #0

## 2020-11-28 MED FILL — HYDROXYZINE HCL 25 MG TABS: 25 | 15 days supply | Qty: 60 | Fill #0

## 2020-11-29 MED FILL — ZOLPIDEM TARTRATE 10 MG TAB: 10 | 30 days supply | Qty: 30 | Fill #2

## 2020-12-13 MED FILL — tiZANidine HCL 4 MG TABS: 4 | 15 days supply | Qty: 30 | Fill #1

## 2020-12-29 MED FILL — NOREL AD TABLET: 4-10-325 | 10 days supply | Qty: 20 | Fill #0

## 2020-12-29 MED FILL — TOBRAMYCIN-DEXAMETH OPTH SU: 0.3-0.1 | 12 days supply | Qty: 5 | Fill #0

## 2020-12-29 MED FILL — HUMALOG 100 UNITS/ML KWIKPE: 100 | 85 days supply | Qty: 39 | Fill #0

## 2020-12-29 MED FILL — GABAPENTIN 300 MG CAPSULE: 300 | 90 days supply | Qty: 270 | Fill #0

## 2020-12-29 MED FILL — PRAVASTATIN NA 40 MG TAB: 40 | 90 days supply | Qty: 90 | Fill #0

## 2021-01-09 ENCOUNTER — Other Ambulatory Visit (HOSPITAL_COMMUNITY): Payer: Self-pay | Admitting: Internal Medicine

## 2021-01-09 DIAGNOSIS — E7849 Other hyperlipidemia: Secondary | ICD-10-CM | POA: Diagnosis not present

## 2021-01-09 DIAGNOSIS — E1142 Type 2 diabetes mellitus with diabetic polyneuropathy: Secondary | ICD-10-CM | POA: Diagnosis not present

## 2021-01-09 DIAGNOSIS — G4700 Insomnia, unspecified: Secondary | ICD-10-CM | POA: Diagnosis not present

## 2021-01-09 DIAGNOSIS — Z0000 Encounter for general adult medical examination without abnormal findings: Secondary | ICD-10-CM | POA: Diagnosis not present

## 2021-01-09 DIAGNOSIS — J302 Other seasonal allergic rhinitis: Secondary | ICD-10-CM | POA: Diagnosis not present

## 2021-01-09 DIAGNOSIS — K219 Gastro-esophageal reflux disease without esophagitis: Secondary | ICD-10-CM | POA: Diagnosis not present

## 2021-01-09 DIAGNOSIS — M5442 Lumbago with sciatica, left side: Secondary | ICD-10-CM | POA: Diagnosis not present

## 2021-01-09 DIAGNOSIS — J4520 Mild intermittent asthma, uncomplicated: Secondary | ICD-10-CM | POA: Diagnosis not present

## 2021-01-09 MED FILL — ZOLPIDEM TARTRATE 10 MG TAB: 10 | 90 days supply | Qty: 90 | Fill #0

## 2021-01-09 MED FILL — PROMETHAZINE 25 MG TABLET: 25 | 90 days supply | Qty: 180 | Fill #0

## 2021-01-09 MED FILL — tiZANidine HCL 4 MG TABS: 4 | 90 days supply | Qty: 180 | Fill #0

## 2021-01-09 MED FILL — FLUTICASONE PROP 50 MCG SPR: 50 | 90 days supply | Qty: 48 | Fill #0

## 2021-01-11 ENCOUNTER — Other Ambulatory Visit (HOSPITAL_COMMUNITY): Payer: Self-pay | Admitting: Internal Medicine

## 2021-01-12 MED FILL — BASAGLAR 100 UNIT/ML KWIKPE: 100 | 37 days supply | Qty: 30 | Fill #0

## 2021-01-26 ENCOUNTER — Other Ambulatory Visit: Payer: Self-pay | Admitting: Internal Medicine

## 2021-01-26 DIAGNOSIS — E1165 Type 2 diabetes mellitus with hyperglycemia: Secondary | ICD-10-CM | POA: Diagnosis not present

## 2021-01-26 DIAGNOSIS — J019 Acute sinusitis, unspecified: Secondary | ICD-10-CM | POA: Diagnosis not present

## 2021-01-26 DIAGNOSIS — J4520 Mild intermittent asthma, uncomplicated: Secondary | ICD-10-CM | POA: Diagnosis not present

## 2021-01-26 DIAGNOSIS — E559 Vitamin D deficiency, unspecified: Secondary | ICD-10-CM | POA: Diagnosis not present

## 2021-01-26 DIAGNOSIS — E1142 Type 2 diabetes mellitus with diabetic polyneuropathy: Secondary | ICD-10-CM | POA: Diagnosis not present

## 2021-01-26 DIAGNOSIS — Z0000 Encounter for general adult medical examination without abnormal findings: Secondary | ICD-10-CM | POA: Diagnosis not present

## 2021-01-26 DIAGNOSIS — E7849 Other hyperlipidemia: Secondary | ICD-10-CM | POA: Diagnosis not present

## 2021-01-26 DIAGNOSIS — N760 Acute vaginitis: Secondary | ICD-10-CM | POA: Diagnosis not present

## 2021-01-26 MED FILL — AZITHROMYCIN 250 MG TABLET: 250 | 5 days supply | Qty: 6 | Fill #0

## 2021-01-26 MED FILL — FLUCONAZOLE 150 MG TABS: 150 | 2 days supply | Qty: 2 | Fill #0

## 2021-01-27 ENCOUNTER — Other Ambulatory Visit (HOSPITAL_COMMUNITY): Payer: Self-pay | Admitting: Internal Medicine

## 2021-01-27 LAB — COMPLETE METABOLIC PANEL WITH GFR
AG Ratio: 1.1 (calc) (ref 1.0–2.5)
ALT: 25 U/L (ref 6–29)
AST: 15 U/L (ref 10–30)
Albumin: 4.3 g/dL (ref 3.6–5.1)
Alkaline phosphatase (APISO): 96 U/L (ref 31–125)
BUN: 14 mg/dL (ref 7–25)
CO2: 25 mmol/L (ref 20–32)
Calcium: 9.7 mg/dL (ref 8.6–10.2)
Chloride: 99 mmol/L (ref 98–110)
Creat: 0.71 mg/dL (ref 0.50–1.10)
GFR, Est African American: 123 mL/min/{1.73_m2} (ref >=60)
GFR, Est Non African American: 106 mL/min/{1.73_m2} (ref >=60)
Globulin: 3.8 g/dL (calc) — ABNORMAL HIGH (ref 1.9–3.7)
Glucose, Bld: 187 mg/dL — ABNORMAL HIGH (ref 65–99)
Potassium: 4.1 mmol/L (ref 3.5–5.3)
Sodium: 138 mmol/L (ref 135–146)
Total Bilirubin: 0.4 mg/dL (ref 0.2–1.2)
Total Protein: 8.1 g/dL (ref 6.1–8.1)

## 2021-01-27 LAB — CBC
HCT: 41.7 % (ref 35.0–45.0)
Hemoglobin: 13.7 g/dL (ref 11.7–15.5)
MCH: 27.5 pg (ref 27.0–33.0)
MCHC: 32.9 g/dL (ref 32.0–36.0)
MCV: 83.6 fL (ref 80.0–100.0)
MPV: 10.3 fL (ref 7.5–12.5)
Platelets: 341 10*3/uL (ref 140–400)
RBC: 4.99 10*6/uL (ref 3.80–5.10)
RDW: 12.5 % (ref 11.0–15.0)
WBC: 7.1 10*3/uL (ref 3.8–10.8)

## 2021-01-27 LAB — LIPID PANEL
Cholesterol: 288 mg/dL — ABNORMAL HIGH (ref <200)
HDL: 45 mg/dL — ABNORMAL LOW (ref >=50)
LDL Cholesterol (Calc): 198 mg/dL (calc) — ABNORMAL HIGH
Non-HDL Cholesterol (Calc): 243 mg/dL (calc) — ABNORMAL HIGH (ref <130)
Total CHOL/HDL Ratio: 6.4 (calc) — ABNORMAL HIGH (ref <5.0)
Triglycerides: 252 mg/dL — ABNORMAL HIGH (ref <150)

## 2021-01-27 LAB — VITAMIN D 25 HYDROXY (VIT D DEFICIENCY, FRACTURES): Vit D, 25-Hydroxy: 19 ng/mL — ABNORMAL LOW (ref 30–100)

## 2021-01-27 LAB — HEMOGLOBIN A1C W/OUT EAG: Hgb A1c MFr Bld: >14 % of total Hgb — ABNORMAL HIGH (ref <5.7)

## 2021-01-27 LAB — TSH: TSH: 1.84 mIU/L

## 2021-01-30 ENCOUNTER — Other Ambulatory Visit (HOSPITAL_COMMUNITY): Payer: Self-pay | Admitting: Internal Medicine

## 2021-01-30 MED FILL — VIT D2 1.25 MG (50,000 UNIT: 1.25 MG | 84 days supply | Qty: 12 | Fill #0

## 2021-02-08 ENCOUNTER — Other Ambulatory Visit (HOSPITAL_COMMUNITY): Payer: Self-pay | Admitting: Internal Medicine

## 2021-02-08 MED FILL — POTASSIUM CL ER 10 MEQ TAB: 10 | 90 days supply | Qty: 90 | Fill #0

## 2021-02-08 MED FILL — HYDROXYZINE HCL 25 MG TABS: 25 | 15 days supply | Qty: 60 | Fill #0

## 2021-02-09 MED FILL — NOREL AD TABLET: 4-10-325 | 10 days supply | Qty: 20 | Fill #0

## 2021-02-10 ENCOUNTER — Other Ambulatory Visit (HOSPITAL_COMMUNITY): Payer: Self-pay | Admitting: Internal Medicine

## 2021-02-10 DIAGNOSIS — Z91018 Allergy to other foods: Secondary | ICD-10-CM | POA: Diagnosis not present

## 2021-02-10 DIAGNOSIS — E7849 Other hyperlipidemia: Secondary | ICD-10-CM | POA: Diagnosis not present

## 2021-02-10 DIAGNOSIS — J4520 Mild intermittent asthma, uncomplicated: Secondary | ICD-10-CM | POA: Diagnosis not present

## 2021-02-10 DIAGNOSIS — E1142 Type 2 diabetes mellitus with diabetic polyneuropathy: Secondary | ICD-10-CM | POA: Diagnosis not present

## 2021-02-10 DIAGNOSIS — K219 Gastro-esophageal reflux disease without esophagitis: Secondary | ICD-10-CM | POA: Diagnosis not present

## 2021-02-10 MED FILL — ALBUTEROL SULFATE HFA 108 (: 108 (90 BAS | 75 days supply | Qty: 54 | Fill #0

## 2021-02-10 MED FILL — DICYCLOMINE 10 MG CAPSULE: 10 | 20 days supply | Qty: 60 | Fill #0

## 2021-02-10 MED FILL — BASAGLAR 100 UNIT/ML KWIKPE: 100 | 90 days supply | Qty: 72 | Fill #1

## 2021-03-01 ENCOUNTER — Other Ambulatory Visit (HOSPITAL_BASED_OUTPATIENT_CLINIC_OR_DEPARTMENT_OTHER): Payer: Self-pay

## 2021-03-30 ENCOUNTER — Other Ambulatory Visit (HOSPITAL_COMMUNITY): Payer: Self-pay

## 2021-03-30 DIAGNOSIS — E1142 Type 2 diabetes mellitus with diabetic polyneuropathy: Secondary | ICD-10-CM | POA: Diagnosis not present

## 2021-03-30 DIAGNOSIS — G4700 Insomnia, unspecified: Secondary | ICD-10-CM | POA: Diagnosis not present

## 2021-03-30 DIAGNOSIS — K219 Gastro-esophageal reflux disease without esophagitis: Secondary | ICD-10-CM | POA: Diagnosis not present

## 2021-03-30 DIAGNOSIS — J4520 Mild intermittent asthma, uncomplicated: Secondary | ICD-10-CM | POA: Diagnosis not present

## 2021-03-30 DIAGNOSIS — J302 Other seasonal allergic rhinitis: Secondary | ICD-10-CM | POA: Diagnosis not present

## 2021-03-30 MED ORDER — ZOLPIDEM TARTRATE 10 MG PO TABS
ORAL_TABLET | ORAL | 2 refills | Status: DC
  Filled 2021-03-30: qty 90, 90d supply, fill #0

## 2021-03-30 MED ORDER — PROMETHAZINE HCL 25 MG PO TABS
ORAL_TABLET | ORAL | 2 refills | Status: DC
  Filled 2021-03-30: qty 180, 90d supply, fill #0
  Filled 2021-10-04: qty 180, 90d supply, fill #1

## 2021-03-30 MED ORDER — INSULIN LISPRO (1 UNIT DIAL) 100 UNIT/ML (KWIKPEN)
PEN_INJECTOR | SUBCUTANEOUS | 5 refills | Status: DC
  Filled 2021-03-30: qty 15, 30d supply, fill #0

## 2021-03-30 MED ORDER — TIZANIDINE HCL 4 MG PO TABS
ORAL_TABLET | ORAL | 2 refills | Status: DC
  Filled 2021-03-30: qty 180, 90d supply, fill #0
  Filled 2021-07-26: qty 180, 90d supply, fill #1

## 2021-03-30 MED ORDER — FLUTICASONE PROPIONATE 50 MCG/ACT NA SUSP
NASAL | 2 refills | Status: DC
  Filled 2021-03-30: qty 16, 30d supply, fill #0

## 2021-03-30 MED ORDER — ERGOCALCIFEROL 1.25 MG (50000 UT) PO CAPS
ORAL_CAPSULE | ORAL | 1 refills | Status: DC
  Filled 2021-03-30: qty 12, 84d supply, fill #0

## 2021-03-30 MED ORDER — ZOLPIDEM TARTRATE 10 MG PO TABS
ORAL_TABLET | ORAL | 0 refills | Status: DC
  Filled 2021-03-30: qty 90, 30d supply, fill #0

## 2021-03-31 ENCOUNTER — Other Ambulatory Visit (HOSPITAL_COMMUNITY): Payer: Self-pay

## 2021-04-06 ENCOUNTER — Other Ambulatory Visit (HOSPITAL_COMMUNITY): Payer: Self-pay | Admitting: Internal Medicine

## 2021-04-06 ENCOUNTER — Other Ambulatory Visit (HOSPITAL_COMMUNITY): Payer: Self-pay

## 2021-04-06 MED FILL — Insulin Detemir Soln Pen-injector 100 Unit/ML: SUBCUTANEOUS | 37 days supply | Qty: 30 | Fill #0 | Status: CN

## 2021-04-06 MED FILL — Gabapentin Cap 300 MG: ORAL | 90 days supply | Qty: 270 | Fill #0 | Status: AC

## 2021-04-06 MED FILL — Pravastatin Sodium Tab 40 MG: ORAL | 90 days supply | Qty: 90 | Fill #0 | Status: AC

## 2021-04-06 MED FILL — Zolpidem Tartrate Tab 10 MG: ORAL | 90 days supply | Qty: 90 | Fill #0 | Status: CN

## 2021-04-06 MED FILL — Ergocalciferol Cap 1.25 MG (50000 Unit): ORAL | 84 days supply | Qty: 12 | Fill #0 | Status: CN

## 2021-04-08 ENCOUNTER — Other Ambulatory Visit (HOSPITAL_COMMUNITY): Payer: Self-pay | Admitting: Internal Medicine

## 2021-04-08 ENCOUNTER — Other Ambulatory Visit (HOSPITAL_COMMUNITY): Payer: Self-pay

## 2021-04-08 MED FILL — Zolpidem Tartrate Tab 10 MG: ORAL | 90 days supply | Qty: 90 | Fill #0 | Status: AC

## 2021-04-11 ENCOUNTER — Other Ambulatory Visit (HOSPITAL_COMMUNITY): Payer: Self-pay | Admitting: Internal Medicine

## 2021-04-11 ENCOUNTER — Other Ambulatory Visit (HOSPITAL_COMMUNITY): Payer: Self-pay

## 2021-04-13 ENCOUNTER — Other Ambulatory Visit (HOSPITAL_COMMUNITY): Payer: Self-pay

## 2021-04-13 ENCOUNTER — Other Ambulatory Visit (HOSPITAL_COMMUNITY): Payer: Self-pay | Admitting: Internal Medicine

## 2021-04-13 MED ORDER — LEVEMIR FLEXTOUCH 100 UNIT/ML ~~LOC~~ SOPN
PEN_INJECTOR | SUBCUTANEOUS | 5 refills | Status: DC
  Filled 2021-04-13: qty 24, 30d supply, fill #0

## 2021-04-17 ENCOUNTER — Other Ambulatory Visit (HOSPITAL_COMMUNITY): Payer: Self-pay

## 2021-04-17 MED ORDER — TOBRAMYCIN-DEXAMETHASONE 0.3-0.1 % OP SUSP
OPHTHALMIC | 2 refills | Status: DC
  Filled 2021-04-17: qty 5, 12d supply, fill #0
  Filled 2021-08-09: qty 5, 12d supply, fill #1
  Filled 2021-10-04: qty 5, 12d supply, fill #2

## 2021-04-18 ENCOUNTER — Other Ambulatory Visit (HOSPITAL_COMMUNITY): Payer: Self-pay

## 2021-04-18 MED FILL — Ergocalciferol Cap 1.25 MG (50000 Unit): ORAL | 84 days supply | Qty: 12 | Fill #0 | Status: AC

## 2021-04-18 MED FILL — Insulin Glargine Soln Pen-Injector 100 Unit/ML: SUBCUTANEOUS | 90 days supply | Qty: 72 | Fill #0 | Status: CN

## 2021-04-27 ENCOUNTER — Other Ambulatory Visit (HOSPITAL_COMMUNITY): Payer: Self-pay

## 2021-04-27 MED FILL — Insulin Glargine Soln Pen-Injector 100 Unit/ML: SUBCUTANEOUS | 37 days supply | Qty: 30 | Fill #0 | Status: AC

## 2021-04-28 ENCOUNTER — Other Ambulatory Visit (HOSPITAL_COMMUNITY): Payer: Self-pay

## 2021-04-28 MED ORDER — INSULIN GLARGINE-YFGN 100 UNIT/ML ~~LOC~~ SOPN
PEN_INJECTOR | SUBCUTANEOUS | 2 refills | Status: DC
  Filled 2021-04-28: qty 24, 30d supply, fill #0

## 2021-04-29 ENCOUNTER — Other Ambulatory Visit (HOSPITAL_COMMUNITY): Payer: Self-pay

## 2021-05-01 ENCOUNTER — Other Ambulatory Visit (HOSPITAL_COMMUNITY): Payer: Self-pay

## 2021-05-20 ENCOUNTER — Other Ambulatory Visit (HOSPITAL_COMMUNITY): Payer: Self-pay

## 2021-05-20 MED FILL — Potassium Chloride Tab ER 10 mEq: ORAL | 90 days supply | Qty: 90 | Fill #0 | Status: AC

## 2021-06-29 ENCOUNTER — Other Ambulatory Visit (HOSPITAL_COMMUNITY): Payer: Self-pay

## 2021-06-29 DIAGNOSIS — G4700 Insomnia, unspecified: Secondary | ICD-10-CM | POA: Diagnosis not present

## 2021-06-29 DIAGNOSIS — M5489 Other dorsalgia: Secondary | ICD-10-CM | POA: Diagnosis not present

## 2021-06-29 DIAGNOSIS — E1142 Type 2 diabetes mellitus with diabetic polyneuropathy: Secondary | ICD-10-CM | POA: Diagnosis not present

## 2021-06-29 DIAGNOSIS — F4312 Post-traumatic stress disorder, chronic: Secondary | ICD-10-CM | POA: Diagnosis not present

## 2021-06-29 DIAGNOSIS — J4520 Mild intermittent asthma, uncomplicated: Secondary | ICD-10-CM | POA: Diagnosis not present

## 2021-06-29 MED ORDER — FREESTYLE LIBRE 2 SENSOR MISC
5 refills | Status: DC
  Filled 2021-06-29: qty 2, 28d supply, fill #0

## 2021-06-29 MED ORDER — FREESTYLE LIBRE 2 READER DEVI
5 refills | Status: DC
  Filled 2021-06-29: qty 1, 30d supply, fill #0

## 2021-06-29 MED ORDER — INSULIN GLARGINE-YFGN 100 UNIT/ML ~~LOC~~ SOPN
PEN_INJECTOR | SUBCUTANEOUS | 2 refills | Status: DC
  Filled 2021-06-29: qty 30, 30d supply, fill #0
  Filled 2021-08-23: qty 30, 30d supply, fill #1
  Filled 2021-09-19: qty 30, 37d supply, fill #2

## 2021-06-29 MED ORDER — ZOLPIDEM TARTRATE 10 MG PO TABS
ORAL_TABLET | ORAL | 2 refills | Status: DC
  Filled 2021-06-29: qty 30, 30d supply, fill #0
  Filled 2021-07-09: qty 90, 90d supply, fill #0
  Filled 2021-10-18: qty 90, 90d supply, fill #1

## 2021-06-29 MED ORDER — TIZANIDINE HCL 4 MG PO TABS
ORAL_TABLET | ORAL | 2 refills | Status: DC
  Filled 2021-06-29: qty 30, 30d supply, fill #0
  Filled 2022-05-09: qty 30, 30d supply, fill #1

## 2021-06-29 MED ORDER — HYDROXYZINE HCL 25 MG PO TABS
ORAL_TABLET | ORAL | 2 refills | Status: DC
  Filled 2021-06-29: qty 60, 15d supply, fill #0
  Filled 2021-11-15: qty 60, 15d supply, fill #1
  Filled 2021-12-27: qty 60, 15d supply, fill #2

## 2021-07-05 ENCOUNTER — Other Ambulatory Visit (HOSPITAL_COMMUNITY): Payer: Self-pay

## 2021-07-06 ENCOUNTER — Other Ambulatory Visit (HOSPITAL_COMMUNITY): Payer: Self-pay

## 2021-07-06 MED ORDER — FLUOCINOLONE ACETONIDE 0.01 % OT OIL
TOPICAL_OIL | OTIC | 2 refills | Status: DC
  Filled 2021-07-06: qty 20, 20d supply, fill #0
  Filled 2022-04-18: qty 20, 20d supply, fill #1

## 2021-07-06 MED ORDER — ZTLIDO 1.8 % EX PTCH
MEDICATED_PATCH | CUTANEOUS | 2 refills | Status: DC
  Filled 2021-07-06: qty 30, 30d supply, fill #0

## 2021-07-07 ENCOUNTER — Other Ambulatory Visit (HOSPITAL_COMMUNITY): Payer: Self-pay

## 2021-07-07 MED ORDER — ZTLIDO 1.8 % EX PTCH
MEDICATED_PATCH | CUTANEOUS | 2 refills | Status: DC
  Filled 2021-07-07 (×2): qty 30, 30d supply, fill #0

## 2021-07-09 ENCOUNTER — Other Ambulatory Visit (HOSPITAL_COMMUNITY): Payer: Self-pay

## 2021-07-10 ENCOUNTER — Other Ambulatory Visit (HOSPITAL_COMMUNITY): Payer: Self-pay

## 2021-07-11 ENCOUNTER — Other Ambulatory Visit (HOSPITAL_COMMUNITY): Payer: Self-pay

## 2021-07-11 MED ORDER — HYDROXYZINE HCL 25 MG PO TABS
25.0000 mg | ORAL_TABLET | Freq: Four times a day (QID) | ORAL | 2 refills | Status: DC | PRN
  Filled 2021-07-11: qty 60, 15d supply, fill #0
  Filled 2021-09-19: qty 60, 15d supply, fill #1
  Filled 2022-05-09: qty 60, 15d supply, fill #2

## 2021-07-11 MED ORDER — GABAPENTIN 300 MG PO CAPS
300.0000 mg | ORAL_CAPSULE | Freq: Three times a day (TID) | ORAL | 6 refills | Status: DC
  Filled 2021-07-11: qty 90, 30d supply, fill #0
  Filled 2021-08-09: qty 90, 30d supply, fill #1
  Filled 2021-09-08: qty 90, 30d supply, fill #2
  Filled 2021-10-04: qty 90, 30d supply, fill #3
  Filled 2021-11-06: qty 90, 30d supply, fill #4

## 2021-07-12 ENCOUNTER — Other Ambulatory Visit (HOSPITAL_COMMUNITY): Payer: Self-pay

## 2021-07-26 MED FILL — Pravastatin Sodium Tab 40 MG: ORAL | 90 days supply | Qty: 90 | Fill #1 | Status: AC

## 2021-07-27 ENCOUNTER — Other Ambulatory Visit (HOSPITAL_COMMUNITY): Payer: Self-pay

## 2021-08-09 ENCOUNTER — Other Ambulatory Visit (HOSPITAL_COMMUNITY): Payer: Self-pay

## 2021-08-11 ENCOUNTER — Other Ambulatory Visit (HOSPITAL_COMMUNITY): Payer: Self-pay

## 2021-08-21 IMAGING — MG DIGITAL SCREENING BILAT W/ TOMO W/ CAD
6 of 10 series · 6 of 30 positions shown · non-contrast
Comparison: None.

CLINICAL DATA: Screening.

EXAM:
DIGITAL SCREENING BILATERAL MAMMOGRAM WITH TOMO AND CAD

[R CC synth-2D]
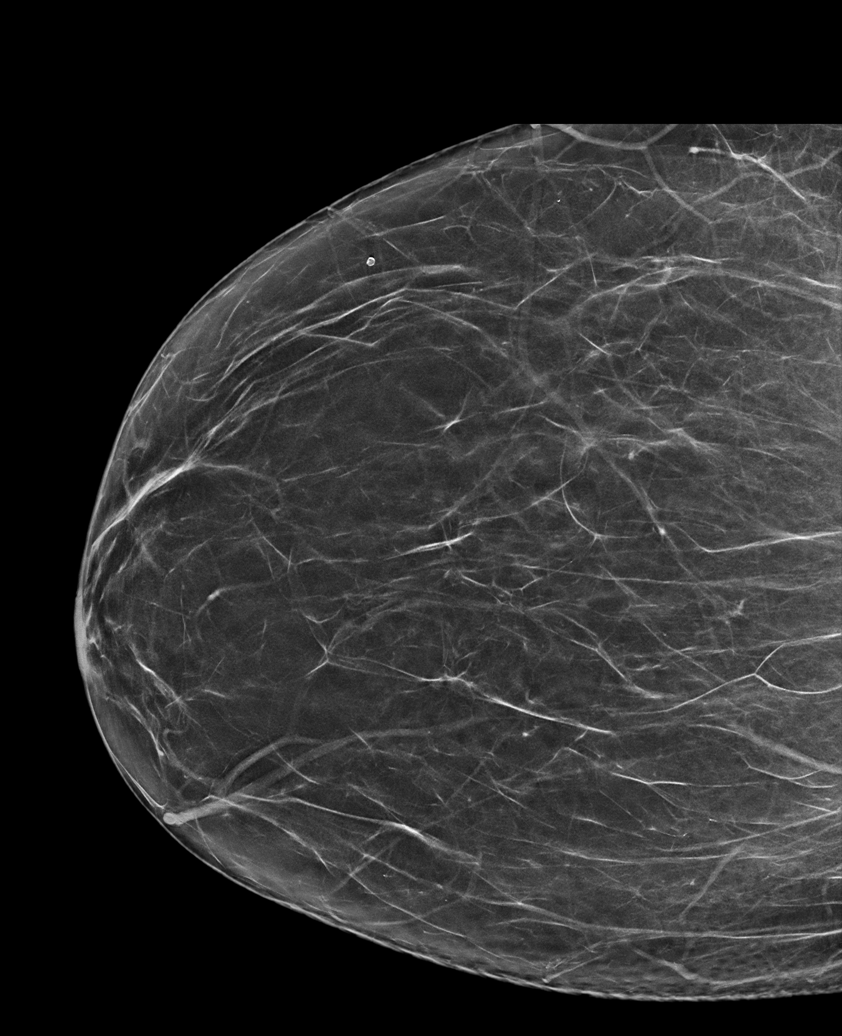

[R MLO synth-2D (1 of 2)]
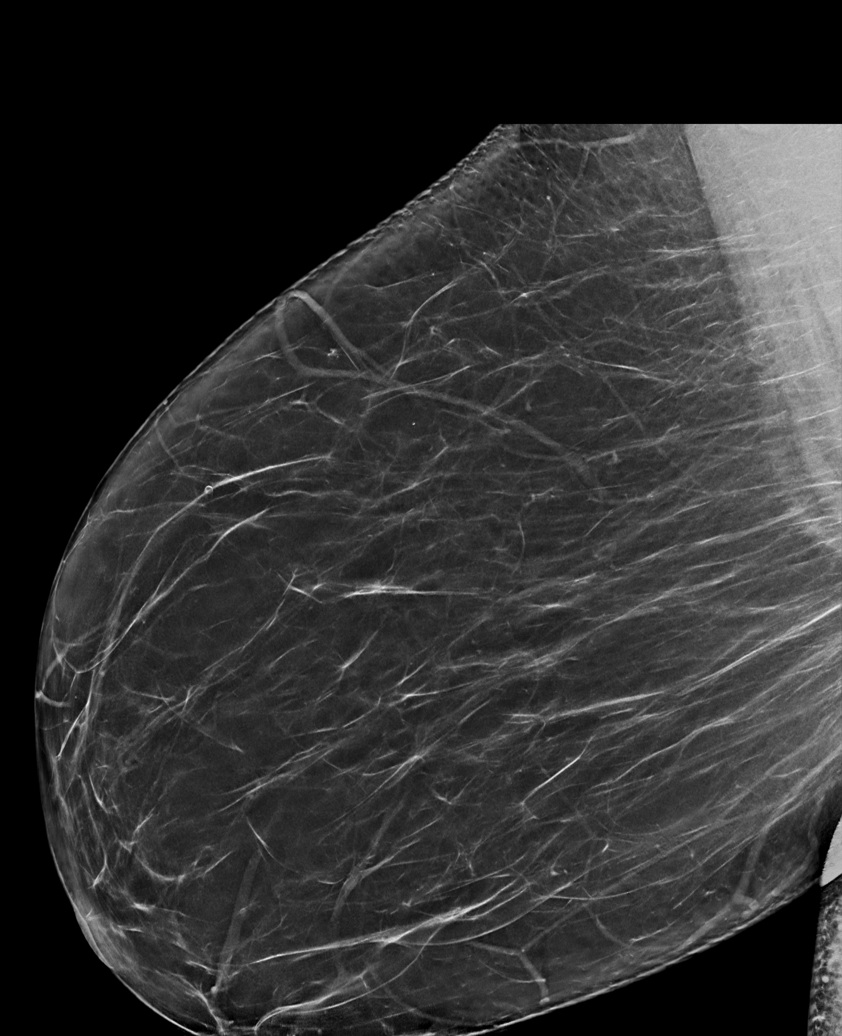

[R MLO synth-2D (2 of 2)]
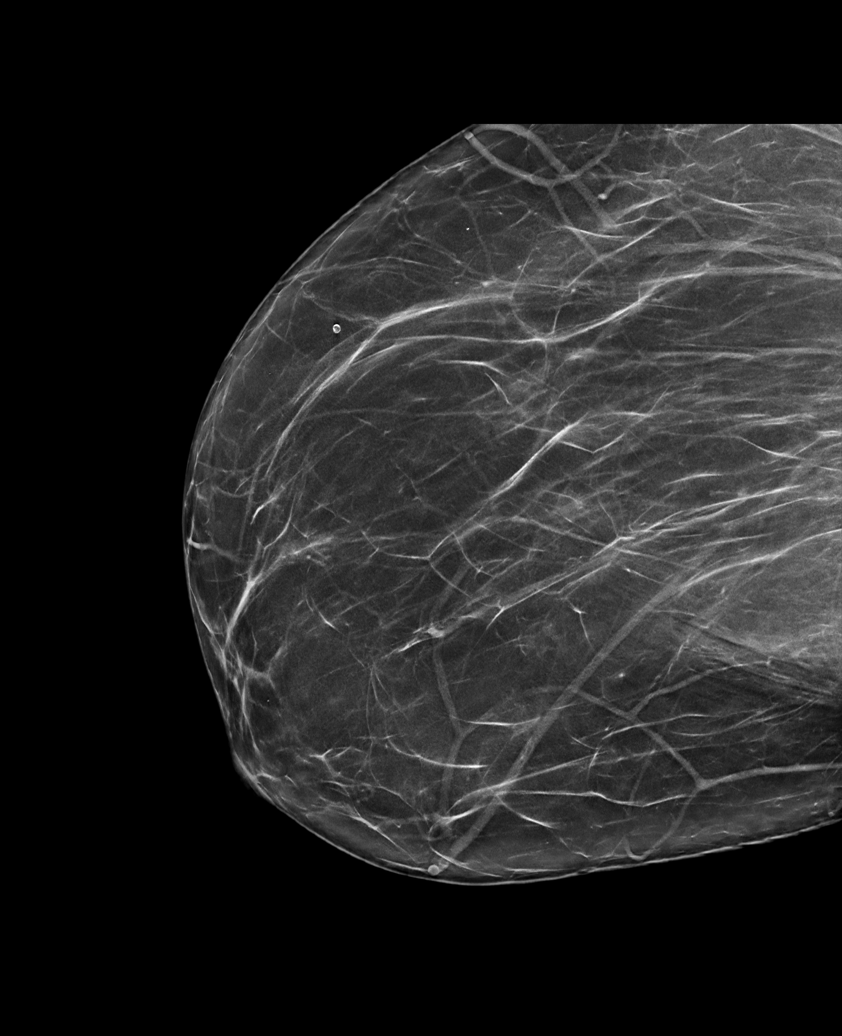

[L CC synth-2D]
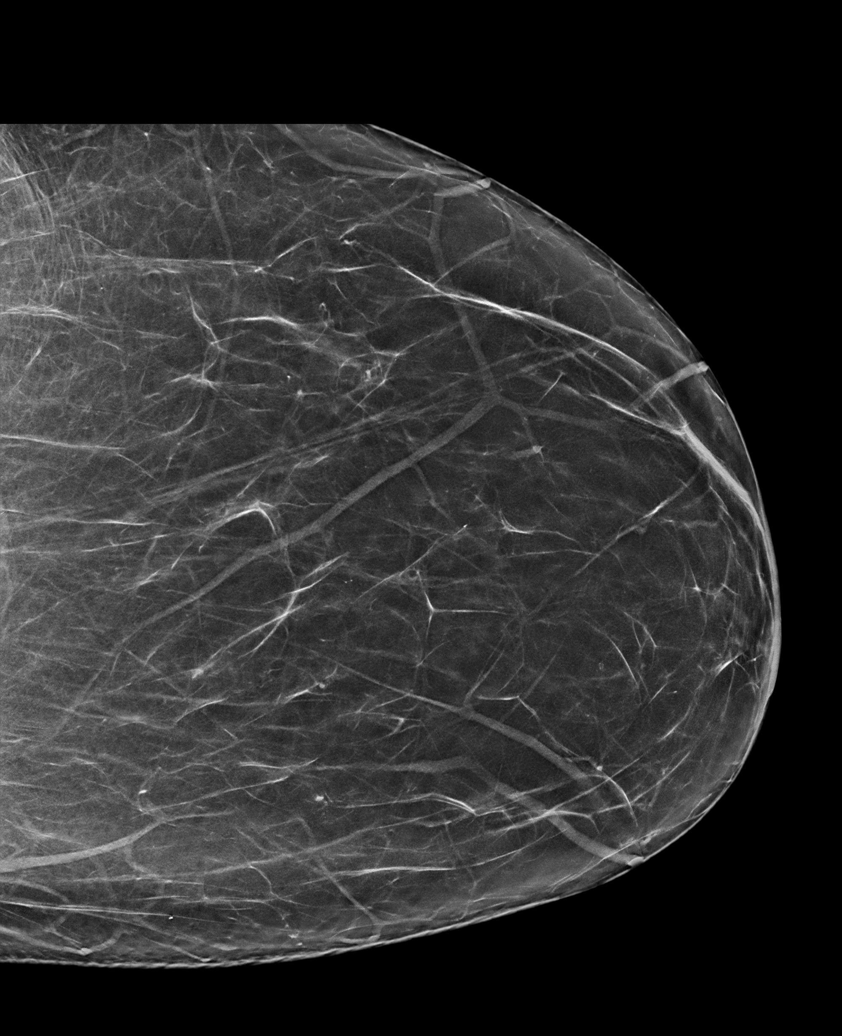

[L MLO synth-2D]
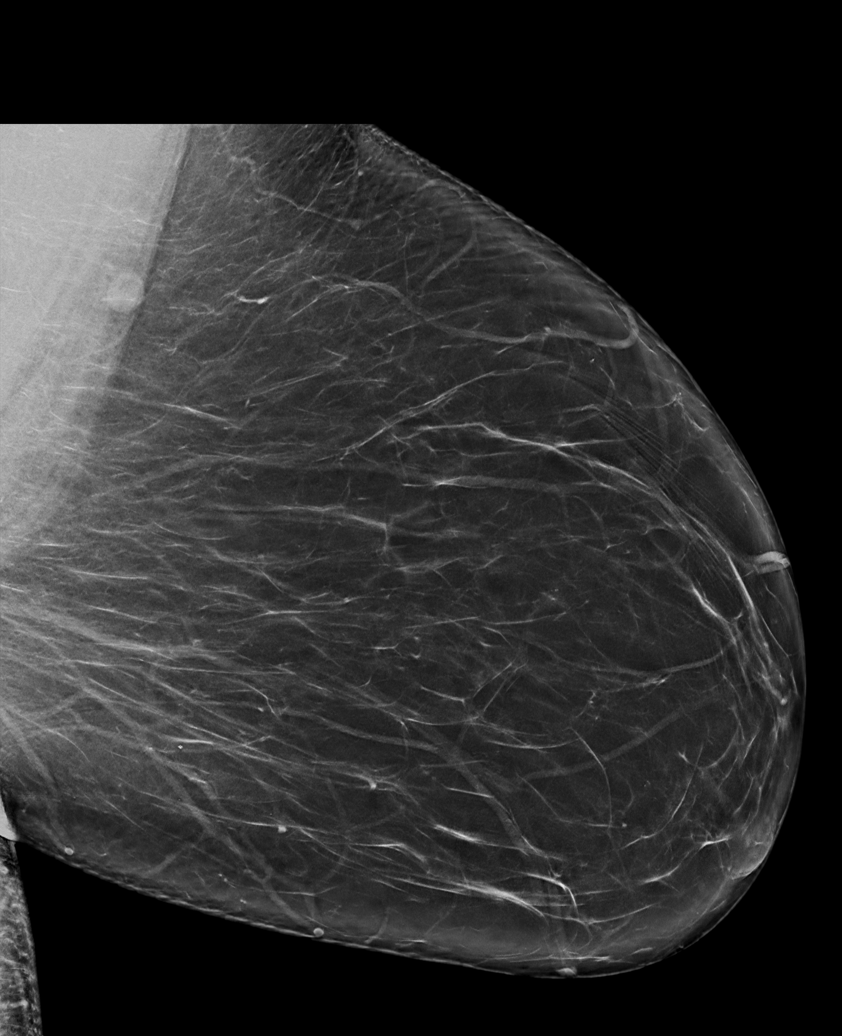

[L MLO tomo · tomo slice 45/89.0]
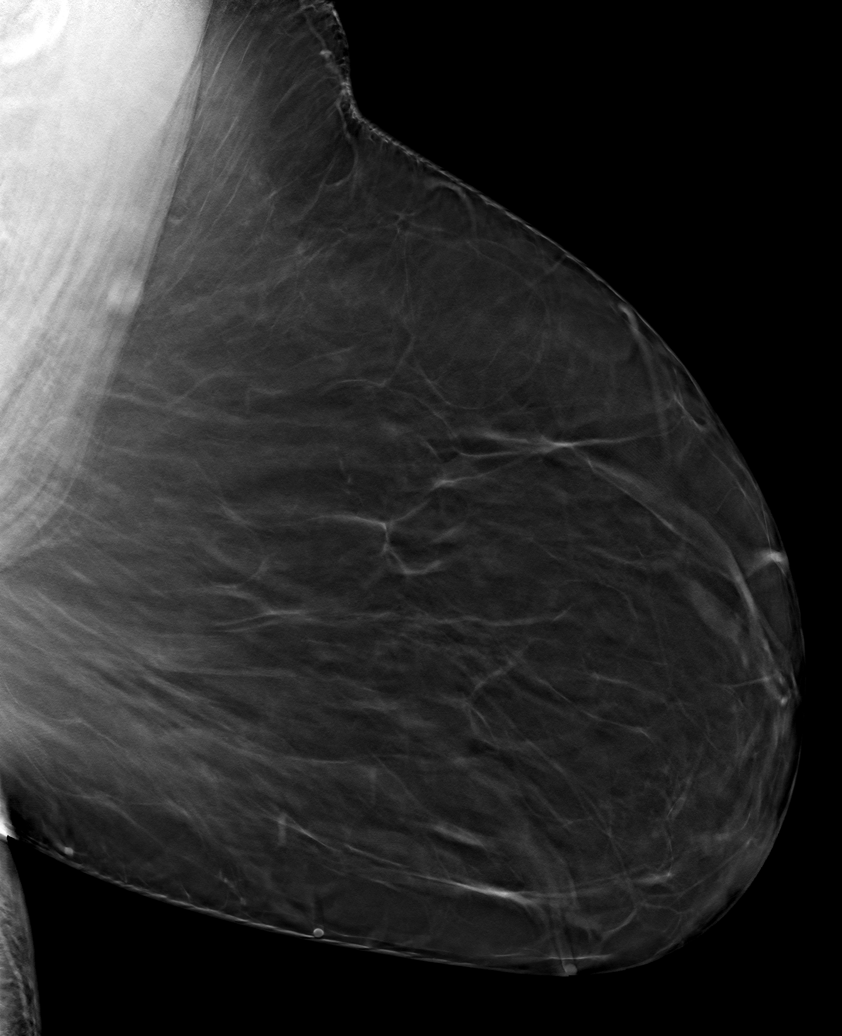

[6 of 30 positions shown; findings below may reference images not displayed]

ACR Breast Density Category b: There are scattered areas of
fibroglandular density.
FINDINGS: There are no findings suspicious for malignancy. Images were
processed with CAD.
IMPRESSION: No mammographic evidence of malignancy. A result letter of this
screening mammogram will be mailed directly to the patient.

RECOMMENDATION:
Screening mammogram in one year. (Code:Y5-G-EJ6)

BI-RADS CATEGORY  1: Negative.

## 2021-08-22 ENCOUNTER — Other Ambulatory Visit (HOSPITAL_COMMUNITY): Payer: Self-pay

## 2021-08-23 ENCOUNTER — Other Ambulatory Visit (HOSPITAL_COMMUNITY): Payer: Self-pay

## 2021-08-23 MED ORDER — POTASSIUM CHLORIDE ER 10 MEQ PO TBCR
EXTENDED_RELEASE_TABLET | Freq: Every day | ORAL | 5 refills | Status: DC
  Filled 2021-08-23: qty 30, 30d supply, fill #0
  Filled 2021-09-19: qty 30, 30d supply, fill #1
  Filled 2021-10-18: qty 30, 30d supply, fill #2
  Filled 2021-11-15: qty 30, 30d supply, fill #3
  Filled 2021-12-19: qty 30, 30d supply, fill #4
  Filled 2022-01-16: qty 30, 30d supply, fill #5

## 2021-08-23 MED FILL — Albuterol Sulfate Inhal Aero 108 MCG/ACT (90MCG Base Equiv): RESPIRATORY_TRACT | 75 days supply | Qty: 54 | Fill #0 | Status: AC

## 2021-09-05 ENCOUNTER — Encounter (HOSPITAL_COMMUNITY): Payer: Self-pay | Admitting: Emergency Medicine

## 2021-09-05 ENCOUNTER — Other Ambulatory Visit (HOSPITAL_COMMUNITY): Payer: Self-pay

## 2021-09-05 ENCOUNTER — Emergency Department (HOSPITAL_COMMUNITY)
Admission: EM | Admit: 2021-09-05 | Discharge: 2021-09-05 | Disposition: A | Discharge status: Home / Self Care | Payer: 59 | Attending: Emergency Medicine | Admitting: Emergency Medicine

## 2021-09-05 ENCOUNTER — Other Ambulatory Visit: Payer: Self-pay

## 2021-09-05 DIAGNOSIS — J449 Chronic obstructive pulmonary disease, unspecified: Secondary | ICD-10-CM | POA: Diagnosis not present

## 2021-09-05 DIAGNOSIS — E1010 Type 1 diabetes mellitus with ketoacidosis without coma: Secondary | ICD-10-CM | POA: Diagnosis not present

## 2021-09-05 DIAGNOSIS — Z7951 Long term (current) use of inhaled steroids: Secondary | ICD-10-CM | POA: Diagnosis not present

## 2021-09-05 DIAGNOSIS — L02415 Cutaneous abscess of right lower limb: Secondary | ICD-10-CM

## 2021-09-05 DIAGNOSIS — M65051 Abscess of tendon sheath, right thigh: Secondary | ICD-10-CM | POA: Insufficient documentation

## 2021-09-05 DIAGNOSIS — Z794 Long term (current) use of insulin: Secondary | ICD-10-CM | POA: Diagnosis not present

## 2021-09-05 DIAGNOSIS — Z87891 Personal history of nicotine dependence: Secondary | ICD-10-CM | POA: Insufficient documentation

## 2021-09-05 DIAGNOSIS — J45909 Unspecified asthma, uncomplicated: Secondary | ICD-10-CM | POA: Diagnosis not present

## 2021-09-05 LAB — CBG MONITORING, ED: Glucose-Capillary: 132 mg/dL — ABNORMAL HIGH (ref 70–99)

## 2021-09-05 MED ORDER — HYDROCODONE-ACETAMINOPHEN 5-325 MG PO TABS
2.0000 | ORAL_TABLET | Freq: Once | ORAL | Status: AC
Start: 2021-09-05 — End: 2021-09-05
  Administered 2021-09-05: 2 via ORAL
  Filled 2021-09-05: qty 2

## 2021-09-05 MED ORDER — SULFAMETHOXAZOLE-TRIMETHOPRIM 800-160 MG PO TABS
1.0000 | ORAL_TABLET | Freq: Two times a day (BID) | ORAL | 0 refills | Status: AC
  Filled 2021-09-05: qty 10, 5d supply, fill #0

## 2021-09-05 MED ORDER — LIDOCAINE-EPINEPHRINE (PF) 2 %-1:200000 IJ SOLN
20.0000 mL | Freq: Once | INTRAMUSCULAR | Status: DC
  Filled 2021-09-05: qty 20

## 2021-09-05 NOTE — ED Triage Notes (Signed)
Patient reports increasing size and swelling of boil at right upper inner thigh onset 2 weeks ago .

## 2021-09-05 NOTE — ED Provider Notes (Signed)
Emergency Medicine Provider Triage Evaluation Note  Sharon Gregory , a 40 y.o. female  was evaluated in triage.  Pt complains of constant swelling to R inner thigh x 2 weeks. Worsening despite topical abx ointment, vaseline, warm compresses. No fevers or hx of MRSA.Marland Kitchen  Review of Systems  Positive: Abscess  Negative: Fevers  Physical Exam  BP (!) 147/92 (BP Location: Left Arm)   Pulse (!) 108   Temp 99.6 F (37.6 C) (Oral)   Resp 18   Ht 5\' 4"  (1.626 m)   Wt 135 kg   SpO2 100%   BMI 51.09 kg/m  Gen:   Awake, no distress   Resp:  Normal effort  MSK:   Moves extremities without difficulty  Other:  Induration with mild central fluctuance of the R inner thigh; 1.5cm x 3cm.  Medical Decision Making  Medically screening exam initiated at 6:02 AM.  Appropriate orders placed.  Sharon Gregory was informed that the remainder of the evaluation will be completed by another provider, this initial triage assessment does not replace that evaluation, and the importance of remaining in the ED until their evaluation is complete.  Abscess, thigh   Rande Brunt, PA-C 09/05/21 09/07/21    [id number], MD 09/05/21 [id number]
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
## 2021-09-08 ENCOUNTER — Other Ambulatory Visit (HOSPITAL_COMMUNITY): Payer: Self-pay

## 2021-09-11 ENCOUNTER — Other Ambulatory Visit (HOSPITAL_COMMUNITY): Payer: Self-pay

## 2021-09-19 ENCOUNTER — Other Ambulatory Visit (HOSPITAL_COMMUNITY): Payer: Self-pay

## 2021-09-29 ENCOUNTER — Other Ambulatory Visit (HOSPITAL_COMMUNITY): Payer: Self-pay

## 2021-09-29 ENCOUNTER — Ambulatory Visit: Payer: 59 | Admitting: Internal Medicine

## 2021-09-29 ENCOUNTER — Other Ambulatory Visit: Payer: Self-pay

## 2021-09-29 VITALS — BP 130/84 | HR 100 | Ht 64.0 in | Wt 271.0 lb

## 2021-09-29 DIAGNOSIS — E876 Hypokalemia: Secondary | ICD-10-CM | POA: Diagnosis not present

## 2021-09-29 DIAGNOSIS — R739 Hyperglycemia, unspecified: Secondary | ICD-10-CM

## 2021-09-29 DIAGNOSIS — E1165 Type 2 diabetes mellitus with hyperglycemia: Secondary | ICD-10-CM

## 2021-09-29 DIAGNOSIS — E1142 Type 2 diabetes mellitus with diabetic polyneuropathy: Secondary | ICD-10-CM | POA: Insufficient documentation

## 2021-09-29 DIAGNOSIS — Z794 Long term (current) use of insulin: Secondary | ICD-10-CM | POA: Diagnosis not present

## 2021-09-29 DIAGNOSIS — E785 Hyperlipidemia, unspecified: Secondary | ICD-10-CM | POA: Diagnosis not present

## 2021-09-29 LAB — LIPID PANEL
Cholesterol: 286 mg/dL — ABNORMAL HIGH (ref 0–200)
HDL: 46.5 mg/dL (ref >39.00)
NonHDL: 239.14
Total CHOL/HDL Ratio: 6
Triglycerides: 205 mg/dL — ABNORMAL HIGH (ref 0.0–149.0)
VLDL: 41 mg/dL — ABNORMAL HIGH (ref 0.0–40.0)

## 2021-09-29 LAB — POCT GLYCOSYLATED HEMOGLOBIN (HGB A1C): Hemoglobin A1C: 13.5 % — AB (ref 4.0–5.6)

## 2021-09-29 LAB — T4, FREE: Free T4: 0.97 ng/dL (ref 0.60–1.60)

## 2021-09-29 LAB — BASIC METABOLIC PANEL
BUN: 17 mg/dL (ref 6–23)
CO2: 26 mEq/L (ref 19–32)
Calcium: 9.9 mg/dL (ref 8.4–10.5)
Chloride: 104 mEq/L (ref 96–112)
Creatinine, Ser: 0.77 mg/dL (ref 0.40–1.20)
GFR: 95.32 mL/min (ref >60.00)
Glucose, Bld: 128 mg/dL — ABNORMAL HIGH (ref 70–99)
Potassium: 4 mEq/L (ref 3.5–5.1)
Sodium: 140 mEq/L (ref 135–145)

## 2021-09-29 LAB — TSH: TSH: 1.15 u[IU]/mL (ref 0.35–5.50)

## 2021-09-29 LAB — GLUCOSE, POCT (MANUAL RESULT ENTRY): POC Glucose: 150 mg/dl — AB (ref 70–99)

## 2021-09-29 LAB — LDL CHOLESTEROL, DIRECT: Direct LDL: 199 mg/dL

## 2021-09-29 MED ORDER — TRULICITY 0.75 MG/0.5ML ~~LOC~~ SOAJ
0.7500 mg | SUBCUTANEOUS | 4 refills | Status: DC
  Filled 2021-09-29 – 2021-10-04 (×4): qty 2, 28d supply, fill #0
  Filled 2021-11-15: qty 2, 28d supply, fill #1
  Filled 2021-12-27: qty 2, 28d supply, fill #2

## 2021-09-29 MED ORDER — TRESIBA FLEXTOUCH 200 UNIT/ML ~~LOC~~ SOPN
100.0000 [IU] | PEN_INJECTOR | Freq: Every day | SUBCUTANEOUS | 3 refills | Status: DC
  Filled 2021-09-29: qty 15, fill #0
  Filled 2021-09-29 – 2021-10-04 (×3): qty 15, 30d supply, fill #0
  Filled 2021-11-06: qty 15, 30d supply, fill #1
  Filled 2021-12-27: qty 15, 30d supply, fill #2

## 2021-09-29 MED ORDER — HUMALOG KWIKPEN 200 UNIT/ML ~~LOC~~ SOPN
PEN_INJECTOR | SUBCUTANEOUS | 3 refills | Status: DC
  Filled 2021-09-29: qty 9, 25d supply, fill #0
  Filled 2021-09-29: qty 15, fill #0
  Filled 2021-09-29: qty 15, 42d supply, fill #0
  Filled 2022-05-09: qty 9, 25d supply, fill #0

## 2021-09-29 MED ORDER — UNIFINE PENTIPS 32G X 6 MM MISC
1.0000 | Freq: Four times a day (QID) | 3 refills | Status: DC
Start: 2021-09-29 — End: 2023-03-06
  Filled 2021-09-29: qty 400, fill #0
  Filled 2021-10-05 (×3): qty 400, 90d supply, fill #0
  Filled 2022-05-09: qty 400, 90d supply, fill #1

## 2021-09-29 MED ORDER — ROSUVASTATIN CALCIUM 40 MG PO TABS
40.0000 mg | ORAL_TABLET | Freq: Every day | ORAL | 3 refills | Status: DC
  Filled 2021-09-29: qty 90, 90d supply, fill #0
  Filled 2021-12-27: qty 90, 90d supply, fill #1
  Filled 2022-03-26: qty 90, 90d supply, fill #2
  Filled 2022-07-11: qty 90, 90d supply, fill #3

## 2021-09-29 NOTE — Patient Instructions (Addendum)
-   STOP Semglee - Start Tresiba 100 units ONCE daily  - Start Trulicity 0.75 mg weekly  - Humalog 12 units with each meal  -Humaloglog correctional insulin: ADD extra units on insulin to your meal-time Humalog dose if your blood sugars are higher than 155. Use the scale below to help guide you:   Blood sugar before meal Number of units to inject  Less than 155 0 unit  156 -  180 1 units  181 -  205 2 units  206 -  230 3 units  231 -  255 4 units  256 -  280 5 units  281 -  305 6 units  306 -  330 7 units  331 -  355 8 units  356 - 380 9 units    HOW TO TREAT LOW BLOOD SUGARS (Blood sugar LESS THAN 70 MG/DL) Please follow the RULE OF 15 for the treatment of hypoglycemia treatment (when your (blood sugars are less than 70 mg/dL)   STEP 1: Take 15 grams of carbohydrates when your blood sugar is low, which includes:  3-4 GLUCOSE TABS  OR 3-4 OZ OF JUICE OR REGULAR SODA OR ONE TUBE OF GLUCOSE GEL    STEP 2: RECHECK blood sugar in 15 MINUTES STEP 3: If your blood sugar is still low at the 15 minute recheck --> then, go back to STEP 1 and treat AGAIN with another 15 grams of carbohydrates.   24-Hour Urine Collection  You will be collecting your urine for a 24-hour period of time. Your timer starts with your first urine of the morning (For example - If you first pee at 9AM, your timer will start at 9AM) Throw away your first urine of the morning Collect your urine every time you pee for the next 24 hours STOP your urine collection 24 hours after you started the collection (For example - You would stop at 9AM the day after you started)
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
## 2021-10-02 ENCOUNTER — Other Ambulatory Visit: Payer: Self-pay

## 2021-10-02 ENCOUNTER — Other Ambulatory Visit: Payer: 59

## 2021-10-02 DIAGNOSIS — E876 Hypokalemia: Secondary | ICD-10-CM

## 2021-10-04 ENCOUNTER — Other Ambulatory Visit (HOSPITAL_COMMUNITY): Payer: Self-pay

## 2021-10-04 MED FILL — Hydroxyzine HCl Tab 25 MG: ORAL | 15 days supply | Qty: 60 | Fill #0 | Status: AC

## 2021-10-04 MED FILL — Chlorphen-Phenylephrine w/ APAP Tab 4-10-325 MG: ORAL | 10 days supply | Qty: 20 | Fill #0 | Status: AC

## 2021-10-05 ENCOUNTER — Other Ambulatory Visit (HOSPITAL_COMMUNITY): Payer: Self-pay

## 2021-10-05 LAB — ALDOSTERONE + RENIN ACTIVITY W/ RATIO
ALDOS/RENIN RATIO: 9.7 (ref 0.0–30.0)
ALDOSTERONE: 3.4 ng/dL (ref 0.0–30.0)
Renin: 0.349 ng/mL/hr (ref 0.167–5.380)

## 2021-10-06 ENCOUNTER — Other Ambulatory Visit (HOSPITAL_COMMUNITY): Payer: Self-pay

## 2021-10-10 LAB — CORTISOL, URINE, 24 HOUR
24 Hour urine volume (VMAHVA): 2500 mL
CREATININE, URINE: 2.29 g/(24.h) — ABNORMAL HIGH (ref 0.50–2.15)
Cortisol (Ur), Free: 22.7 mcg/24 h (ref 4.0–50.0)

## 2021-10-16 ENCOUNTER — Other Ambulatory Visit (HOSPITAL_COMMUNITY): Payer: Self-pay

## 2021-10-17 ENCOUNTER — Other Ambulatory Visit (HOSPITAL_COMMUNITY): Payer: Self-pay

## 2021-10-17 MED ORDER — FREESTYLE LITE TEST VI STRP
ORAL_STRIP | 5 refills | Status: DC
  Filled 2021-10-17: qty 300, 90d supply, fill #0

## 2021-10-17 MED ORDER — FREESTYLE LITE W/DEVICE KIT
PACK | 0 refills | Status: DC
  Filled 2021-10-17: qty 1, 90d supply, fill #0

## 2021-10-17 MED ORDER — FREESTYLE LANCETS MISC
5 refills | Status: DC
  Filled 2021-10-17: qty 300, 90d supply, fill #0

## 2021-10-18 ENCOUNTER — Other Ambulatory Visit (HOSPITAL_COMMUNITY): Payer: Self-pay

## 2021-10-18 MED FILL — Albuterol Sulfate Inhal Aero 108 MCG/ACT (90MCG Base Equiv): RESPIRATORY_TRACT | 75 days supply | Qty: 54 | Fill #1 | Status: AC

## 2021-10-20 ENCOUNTER — Other Ambulatory Visit (HOSPITAL_COMMUNITY): Payer: Self-pay

## 2021-10-23 ENCOUNTER — Other Ambulatory Visit (HOSPITAL_COMMUNITY): Payer: Self-pay

## 2021-10-27 DIAGNOSIS — E11319 Type 2 diabetes mellitus with unspecified diabetic retinopathy without macular edema: Secondary | ICD-10-CM | POA: Diagnosis not present

## 2021-10-27 LAB — HM DIABETES EYE EXAM

## 2021-11-06 ENCOUNTER — Other Ambulatory Visit (HOSPITAL_COMMUNITY): Payer: Self-pay

## 2021-11-07 ENCOUNTER — Other Ambulatory Visit (HOSPITAL_COMMUNITY): Payer: Self-pay

## 2021-11-15 ENCOUNTER — Other Ambulatory Visit (HOSPITAL_COMMUNITY): Payer: Self-pay

## 2021-11-15 MED FILL — Tizanidine HCl Tab 4 MG (Base Equivalent): ORAL | 90 days supply | Qty: 180 | Fill #0 | Status: AC

## 2021-11-22 ENCOUNTER — Other Ambulatory Visit (HOSPITAL_COMMUNITY): Payer: Self-pay

## 2021-11-22 DIAGNOSIS — M25561 Pain in right knee: Secondary | ICD-10-CM | POA: Diagnosis not present

## 2021-11-22 DIAGNOSIS — E0842 Diabetes mellitus due to underlying condition with diabetic polyneuropathy: Secondary | ICD-10-CM | POA: Diagnosis not present

## 2021-11-22 DIAGNOSIS — J4520 Mild intermittent asthma, uncomplicated: Secondary | ICD-10-CM | POA: Diagnosis not present

## 2021-11-22 DIAGNOSIS — E1142 Type 2 diabetes mellitus with diabetic polyneuropathy: Secondary | ICD-10-CM | POA: Diagnosis not present

## 2021-11-22 MED ORDER — MELOXICAM 15 MG PO TABS
15.0000 mg | ORAL_TABLET | Freq: Every day | ORAL | 2 refills | Status: DC
  Filled 2021-11-22: qty 30, 30d supply, fill #0

## 2021-11-22 MED ORDER — GABAPENTIN 600 MG PO TABS
600.0000 mg | ORAL_TABLET | Freq: Three times a day (TID) | ORAL | 6 refills | Status: DC
  Filled 2021-11-22: qty 90, 30d supply, fill #0
  Filled 2021-12-27: qty 90, 30d supply, fill #1
  Filled 2022-01-26: qty 90, 30d supply, fill #2
  Filled 2022-02-26: qty 90, 30d supply, fill #3
  Filled 2022-03-26: qty 90, 30d supply, fill #4
  Filled 2022-04-25: qty 90, 30d supply, fill #5
  Filled 2022-05-23: qty 90, 30d supply, fill #6

## 2021-11-23 DIAGNOSIS — M25561 Pain in right knee: Secondary | ICD-10-CM | POA: Diagnosis not present

## 2021-12-19 ENCOUNTER — Other Ambulatory Visit (HOSPITAL_COMMUNITY): Payer: Self-pay

## 2021-12-27 ENCOUNTER — Telehealth: Payer: Self-pay | Admitting: Internal Medicine

## 2021-12-27 ENCOUNTER — Other Ambulatory Visit (HOSPITAL_COMMUNITY): Payer: Self-pay

## 2021-12-27 ENCOUNTER — Encounter: Payer: Self-pay | Admitting: Internal Medicine

## 2021-12-27 ENCOUNTER — Other Ambulatory Visit: Payer: Self-pay | Admitting: Internal Medicine

## 2021-12-27 DIAGNOSIS — Z794 Long term (current) use of insulin: Secondary | ICD-10-CM

## 2021-12-27 DIAGNOSIS — E1165 Type 2 diabetes mellitus with hyperglycemia: Secondary | ICD-10-CM

## 2021-12-27 MED ORDER — TOUJEO SOLOSTAR 300 UNIT/ML ~~LOC~~ SOPN
100.0000 [IU] | PEN_INJECTOR | Freq: Every day | SUBCUTANEOUS | 6 refills | Status: DC
  Filled 2021-12-27: qty 15, 45d supply, fill #0

## 2021-12-27 NOTE — Telephone Encounter (Signed)
Patient has been notified and will give [place] a call back if needed.

## 2021-12-27 NOTE — Telephone Encounter (Signed)
Message has already been sent to provider for alternative

## 2021-12-27 NOTE — Telephone Encounter (Signed)
Patient called re: Patient's insurance no longer covers [place]. Patient states [facility] told Patient they have sent a request to Dr. Lonzo Cloud for an alternative for Sharon Gregory and for Patient to let Dr. Lonzo Cloud know.

## 2021-12-27 NOTE — Telephone Encounter (Signed)
I have switched it to [place]   Pt needs to contact the pharmacy or insurance and figure out why so high ? If she has a high deductible that she has to meet , then this will happen with anything that  I prescribe   If its a formulary issue, then she needs to know we don;t know her insurance formulary and she needs to contact them and let us know    Thanks

## 2022-01-01 ENCOUNTER — Other Ambulatory Visit (HOSPITAL_COMMUNITY): Payer: Self-pay

## 2022-01-01 MED ORDER — SULFAMETHOXAZOLE-TRIMETHOPRIM 800-160 MG PO TABS
ORAL_TABLET | ORAL | 0 refills | Status: DC
  Filled 2022-01-01: qty 20, 10d supply, fill #0

## 2022-01-01 MED ORDER — NOREL AD 4-10-325 MG PO TABS
ORAL_TABLET | ORAL | 2 refills | Status: DC
  Filled 2022-01-01: qty 20, 30d supply, fill #0

## 2022-01-02 ENCOUNTER — Other Ambulatory Visit (HOSPITAL_COMMUNITY): Payer: Self-pay

## 2022-01-02 MED ORDER — FLUCONAZOLE 150 MG PO TABS
ORAL_TABLET | ORAL | 0 refills | Status: DC
  Filled 2022-01-02: qty 2, 14d supply, fill #0

## 2022-01-05 ENCOUNTER — Ambulatory Visit: Payer: 59 | Admitting: Internal Medicine

## 2022-01-16 ENCOUNTER — Other Ambulatory Visit (HOSPITAL_COMMUNITY): Payer: Self-pay

## 2022-01-16 MED ORDER — ZOLPIDEM TARTRATE 10 MG PO TABS
ORAL_TABLET | ORAL | 2 refills | Status: DC
  Filled 2022-01-16: qty 90, 90d supply, fill #0
  Filled 2022-04-18: qty 90, 90d supply, fill #1

## 2022-01-26 ENCOUNTER — Encounter: Payer: Self-pay | Admitting: Internal Medicine

## 2022-01-26 ENCOUNTER — Other Ambulatory Visit: Payer: Self-pay

## 2022-01-26 ENCOUNTER — Other Ambulatory Visit (HOSPITAL_COMMUNITY): Payer: Self-pay

## 2022-01-26 ENCOUNTER — Ambulatory Visit: Payer: 59 | Admitting: Internal Medicine

## 2022-01-26 VITALS — BP 124/82 | HR 103 | Ht 64.0 in | Wt 272.0 lb

## 2022-01-26 DIAGNOSIS — E785 Hyperlipidemia, unspecified: Secondary | ICD-10-CM | POA: Diagnosis not present

## 2022-01-26 DIAGNOSIS — Z794 Long term (current) use of insulin: Secondary | ICD-10-CM

## 2022-01-26 DIAGNOSIS — E11319 Type 2 diabetes mellitus with unspecified diabetic retinopathy without macular edema: Secondary | ICD-10-CM

## 2022-01-26 DIAGNOSIS — E1165 Type 2 diabetes mellitus with hyperglycemia: Secondary | ICD-10-CM | POA: Diagnosis not present

## 2022-01-26 DIAGNOSIS — E1142 Type 2 diabetes mellitus with diabetic polyneuropathy: Secondary | ICD-10-CM

## 2022-01-26 LAB — POCT GLYCOSYLATED HEMOGLOBIN (HGB A1C): Hemoglobin A1C: 13.5 % — AB (ref 4.0–5.6)

## 2022-01-26 LAB — POCT GLUCOSE (DEVICE FOR HOME USE): Glucose Fasting, POC: 206 mg/dL — AB (ref 70–99)

## 2022-01-26 LAB — COMPREHENSIVE METABOLIC PANEL
ALT: 53 U/L — ABNORMAL HIGH (ref 0–35)
AST: 31 U/L (ref 0–37)
Albumin: 4.1 g/dL (ref 3.5–5.2)
Alkaline Phosphatase: 91 U/L (ref 39–117)
BUN: 14 mg/dL (ref 6–23)
CO2: 31 mEq/L (ref 19–32)
Calcium: 9.8 mg/dL (ref 8.4–10.5)
Chloride: 101 mEq/L (ref 96–112)
Creatinine, Ser: 0.88 mg/dL (ref 0.40–1.20)
GFR: 81.03 mL/min (ref >60.00)
Glucose, Bld: 203 mg/dL — ABNORMAL HIGH (ref 70–99)
Potassium: 4.4 mEq/L (ref 3.5–5.1)
Sodium: 139 mEq/L (ref 135–145)
Total Bilirubin: 0.4 mg/dL (ref 0.2–1.2)
Total Protein: 7.3 g/dL (ref 6.0–8.3)

## 2022-01-26 LAB — LIPID PANEL
Cholesterol: 195 mg/dL (ref 0–200)
HDL: 38.7 mg/dL — ABNORMAL LOW (ref >39.00)
NonHDL: 156.37
Total CHOL/HDL Ratio: 5
Triglycerides: 222 mg/dL — ABNORMAL HIGH (ref 0.0–149.0)
VLDL: 44.4 mg/dL — ABNORMAL HIGH (ref 0.0–40.0)

## 2022-01-26 LAB — LDL CHOLESTEROL, DIRECT: Direct LDL: 116 mg/dL

## 2022-01-26 MED ORDER — TRESIBA FLEXTOUCH 200 UNIT/ML ~~LOC~~ SOPN
110.0000 [IU] | PEN_INJECTOR | Freq: Every day | SUBCUTANEOUS | 3 refills | Status: DC
  Filled 2022-01-26: qty 48, 87d supply, fill #0

## 2022-01-26 MED ORDER — FREESTYLE LIBRE 2 SENSOR MISC
1.0000 | 3 refills | Status: DC
  Filled 2022-01-26: qty 6, 84d supply, fill #0
  Filled 2022-05-23: qty 6, 84d supply, fill #1

## 2022-01-26 MED ORDER — TRULICITY 1.5 MG/0.5ML ~~LOC~~ SOAJ
1.5000 mg | SUBCUTANEOUS | 3 refills | Status: DC
Start: 2022-01-26 — End: 2022-10-19
  Filled 2022-01-26: qty 6, 84d supply, fill #0

## 2022-01-26 MED ORDER — DEXCOM G7 SENSOR MISC
1.0000 | 3 refills | Status: DC
  Filled 2022-01-26: qty 3, 30d supply, fill #0

## 2022-01-26 MED ORDER — INSULIN GLARGINE-YFGN 100 UNIT/ML ~~LOC~~ SOPN
110.0000 [IU] | PEN_INJECTOR | Freq: Every day | SUBCUTANEOUS | 6 refills | Status: DC
  Filled 2022-01-26: qty 30, 28d supply, fill #0
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
## 2022-01-26 NOTE — Patient Instructions (Signed)
°-   Increase Tresiba 110 units ONCE daily  - Increase  Trulicity 1.5  mg weekly  - Humalog 12 units with each meal  -Humaloglog correctional insulin: ADD extra units on insulin to your meal-time Humalog dose if your blood sugars are higher than 155. Use the scale below to help guide you:   Blood sugar before meal Number of units to inject  Less than 155 0 unit  156 -  180 1 units  181 -  205 2 units  206 -  230 3 units  231 -  255 4 units  256 -  280 5 units  281 -  305 6 units  306 -  330 7 units  331 -  355 8 units  356 - 380 9 units    HOW TO TREAT LOW BLOOD SUGARS (Blood sugar LESS THAN 70 MG/DL) Please follow the RULE OF 15 for the treatment of hypoglycemia treatment (when your (blood sugars are less than 70 mg/dL)   STEP 1: Take 15 grams of carbohydrates when your blood sugar is low, which includes:  3-4 GLUCOSE TABS  OR 3-4 OZ OF JUICE OR REGULAR SODA OR ONE TUBE OF GLUCOSE GEL    STEP 2: RECHECK blood sugar in 15 MINUTES STEP 3: If your blood sugar is still low at the 15 minute recheck --> then, go back to STEP 1 and treat AGAIN with another 15 grams of carbohydrates.

## 2022-01-28 ENCOUNTER — Other Ambulatory Visit (HOSPITAL_COMMUNITY): Payer: Self-pay

## 2022-01-29 ENCOUNTER — Other Ambulatory Visit (HOSPITAL_COMMUNITY): Payer: Self-pay

## 2022-02-01 ENCOUNTER — Other Ambulatory Visit (HOSPITAL_COMMUNITY): Payer: Self-pay

## 2022-02-01 DIAGNOSIS — Z0000 Encounter for general adult medical examination without abnormal findings: Secondary | ICD-10-CM | POA: Diagnosis not present

## 2022-02-01 DIAGNOSIS — M5442 Lumbago with sciatica, left side: Secondary | ICD-10-CM | POA: Diagnosis not present

## 2022-02-01 DIAGNOSIS — Z1239 Encounter for other screening for malignant neoplasm of breast: Secondary | ICD-10-CM | POA: Diagnosis not present

## 2022-02-01 DIAGNOSIS — E7849 Other hyperlipidemia: Secondary | ICD-10-CM | POA: Diagnosis not present

## 2022-02-01 DIAGNOSIS — G4700 Insomnia, unspecified: Secondary | ICD-10-CM | POA: Diagnosis not present

## 2022-02-01 DIAGNOSIS — E1142 Type 2 diabetes mellitus with diabetic polyneuropathy: Secondary | ICD-10-CM | POA: Diagnosis not present

## 2022-02-01 DIAGNOSIS — J302 Other seasonal allergic rhinitis: Secondary | ICD-10-CM | POA: Diagnosis not present

## 2022-02-01 DIAGNOSIS — J4520 Mild intermittent asthma, uncomplicated: Secondary | ICD-10-CM | POA: Diagnosis not present

## 2022-02-01 MED ORDER — LEVOCETIRIZINE DIHYDROCHLORIDE 5 MG PO TABS
ORAL_TABLET | ORAL | 2 refills | Status: DC
  Filled 2022-02-01: qty 90, 90d supply, fill #0
  Filled 2022-05-09: qty 90, 90d supply, fill #1
  Filled 2022-12-14 – 2022-12-30 (×2): qty 90, 90d supply, fill #2

## 2022-02-01 MED ORDER — POTASSIUM CHLORIDE ER 10 MEQ PO TBCR
EXTENDED_RELEASE_TABLET | Freq: Every day | ORAL | 5 refills | Status: DC
Start: 2022-02-01 — End: 2022-07-12
  Filled 2022-02-01: qty 30, 30d supply, fill #0
  Filled 2022-03-20: qty 30, 30d supply, fill #1
  Filled 2022-04-18: qty 30, 30d supply, fill #2
  Filled 2022-05-09 – 2022-05-11 (×3): qty 30, 30d supply, fill #3
  Filled 2022-06-16: qty 30, 30d supply, fill #4
  Filled 2022-07-11: qty 30, 30d supply, fill #5

## 2022-02-01 MED ORDER — TIZANIDINE HCL 4 MG PO TABS
ORAL_TABLET | ORAL | 2 refills | Status: DC
Start: 2022-02-01 — End: 2022-10-19
  Filled 2022-02-01: qty 180, 90d supply, fill #0
  Filled 2022-05-10: qty 180, 90d supply, fill #1
  Filled 2022-10-18: qty 180, 90d supply, fill #2

## 2022-02-01 MED ORDER — HYDROXYZINE HCL 25 MG PO TABS
ORAL_TABLET | ORAL | 2 refills | Status: DC
  Filled 2022-02-01: qty 60, 15d supply, fill #0
  Filled 2022-02-26: qty 60, 15d supply, fill #1
  Filled 2022-04-02: qty 60, 15d supply, fill #2

## 2022-02-06 ENCOUNTER — Other Ambulatory Visit (HOSPITAL_COMMUNITY): Payer: Self-pay

## 2022-02-06 MED ORDER — ESOMEPRAZOLE MAGNESIUM 40 MG PO CPDR
DELAYED_RELEASE_CAPSULE | ORAL | 2 refills | Status: DC
  Filled 2022-02-06: qty 90, 90d supply, fill #0
  Filled 2022-05-09: qty 90, 90d supply, fill #1
  Filled 2022-10-18: qty 90, 90d supply, fill #2

## 2022-02-13 ENCOUNTER — Other Ambulatory Visit (HOSPITAL_COMMUNITY): Payer: Self-pay

## 2022-02-14 ENCOUNTER — Telehealth: Payer: 59 | Admitting: Physician Assistant

## 2022-02-14 ENCOUNTER — Other Ambulatory Visit (HOSPITAL_COMMUNITY): Payer: Self-pay

## 2022-02-14 ENCOUNTER — Telehealth: Payer: Self-pay | Admitting: Physician Assistant

## 2022-02-14 DIAGNOSIS — L509 Urticaria, unspecified: Secondary | ICD-10-CM | POA: Diagnosis not present

## 2022-02-14 MED ORDER — FAMOTIDINE 20 MG PO TABS
20.0000 mg | ORAL_TABLET | Freq: Two times a day (BID) | ORAL | 0 refills | Status: DC
  Filled 2022-02-14: qty 30, 15d supply, fill #0

## 2022-02-14 MED ORDER — PREDNISONE 20 MG PO TABS
ORAL_TABLET | ORAL | 0 refills | Status: DC
  Filled 2022-02-14: qty 10, 7d supply, fill #0

## 2022-02-14 NOTE — Progress Notes (Signed)
E Visit for Rash ? ?We are sorry that you are not feeling well. Here is how we plan to help! ? ?Giving that your prescription antihistamine (Hydroxyzine) is not calming things down further we need to consider using additional medications with the hydroxyzine to get better control. Since you are a diabetic with a very high A1C on last check (13.5), we cannot start steroids via e-visit or telehealth as would warrant closer monitoring. I am going to add on an H2 blocker, Famotidine 20 mg, to take twice daily along with the Hydroxyzine. If no substantial improvement overnight or anything worsens, you need to be evaluated in person to (1) confirm what is going on and (2) Get proper supervised treatments.  ? ?HOME CARE: ? ?Take cool showers and avoid direct sunlight. ?Apply cool compress or wet dressings. ?Take a bath in an oatmeal bath.  Sprinkle content of one Aveeno packet under running faucet with comfortably warm water.  Bathe for 15-20 minutes, 1-2 times daily.  Pat dry with a towel. Do not rub the rash. ?Use hydrocortisone cream. ?Take an antihistamine like Benadryl for widespread rashes that itch.  The adult dose of Benadryl is 25-50 mg by mouth 4 times daily. ?Caution:  This type of medication may cause sleepiness.  Do not drink alcohol, drive, or operate dangerous machinery while taking antihistamines.  Do not take these medications if you have prostate enlargement.  Read package instructions thoroughly on all medications that you take. ? ?GET HELP RIGHT AWAY IF: ? ?Symptoms don't go away after treatment. ?Severe itching that persists. ?If you rash spreads or swells. ?If you rash begins to smell. ?If it blisters and opens or develops a yellow-brown crust. ?You develop a fever. ?You have a sore throat. ?You become short of breath. ? ?MAKE SURE YOU: ? ?Understand these instructions. ?Will watch your condition. ?Will get help right away if you are not doing well or get worse. ? ?Thank you for choosing an  e-visit. ? ?Your e-visit answers were reviewed by a board certified advanced clinical practitioner to complete your personal care plan. Depending upon the condition, your plan could have included both over the counter or prescription medications. ? ?Please review your pharmacy choice. Make sure the pharmacy is open so you can pick up prescription now. If there is a problem, you may contact your provider through [employer] and have the prescription routed to another pharmacy.  Your safety is important to [place]. If you have drug allergies check your prescription carefully.  ? ?For the next 24 hours you can use MyChart to ask questions about today's visit, request a non-urgent call back, or ask for a work or school excuse. ?You will get an email in the next two days asking about your experience. I hope that your e-visit has been valuable and will speed your recovery. ? ?

## 2022-02-14 NOTE — Telephone Encounter (Signed)
error 

## 2022-02-14 NOTE — Progress Notes (Signed)
I have spent 5 minutes in review of e-visit questionnaire, review and updating patient chart, medical decision making and response to patient.   Keller Bounds Cody Concha Sudol, PA-C    

## 2022-02-21 ENCOUNTER — Other Ambulatory Visit (HOSPITAL_COMMUNITY): Payer: Self-pay

## 2022-02-21 DIAGNOSIS — L508 Other urticaria: Secondary | ICD-10-CM | POA: Diagnosis not present

## 2022-02-21 DIAGNOSIS — E1142 Type 2 diabetes mellitus with diabetic polyneuropathy: Secondary | ICD-10-CM | POA: Diagnosis not present

## 2022-02-21 DIAGNOSIS — J4520 Mild intermittent asthma, uncomplicated: Secondary | ICD-10-CM | POA: Diagnosis not present

## 2022-02-21 MED ORDER — PREDNISONE 10 MG PO TABS
ORAL_TABLET | Freq: Every day | ORAL | 0 refills | Status: DC
  Filled 2022-02-21: qty 20, 20d supply, fill #0

## 2022-02-27 ENCOUNTER — Other Ambulatory Visit (HOSPITAL_COMMUNITY): Payer: Self-pay

## 2022-02-27 ENCOUNTER — Other Ambulatory Visit: Payer: Self-pay | Admitting: Internal Medicine

## 2022-02-27 MED ORDER — BASAGLAR KWIKPEN 100 UNIT/ML ~~LOC~~ SOPN
110.0000 [IU] | PEN_INJECTOR | Freq: Every day | SUBCUTANEOUS | 6 refills | Status: DC
  Filled 2022-02-27: qty 30, 28d supply, fill #0
  Filled 2022-04-02: qty 30, 28d supply, fill #1

## 2022-02-28 ENCOUNTER — Other Ambulatory Visit (HOSPITAL_COMMUNITY): Payer: Self-pay

## 2022-03-15 DIAGNOSIS — J4520 Mild intermittent asthma, uncomplicated: Secondary | ICD-10-CM | POA: Diagnosis not present

## 2022-03-15 DIAGNOSIS — E1142 Type 2 diabetes mellitus with diabetic polyneuropathy: Secondary | ICD-10-CM | POA: Diagnosis not present

## 2022-03-16 ENCOUNTER — Other Ambulatory Visit (HOSPITAL_COMMUNITY): Payer: Self-pay

## 2022-03-16 MED ORDER — INSULIN LISPRO (1 UNIT DIAL) 100 UNIT/ML (KWIKPEN)
PEN_INJECTOR | SUBCUTANEOUS | 5 refills | Status: DC
  Filled 2022-03-16: qty 3, 20d supply, fill #0
  Filled 2022-03-20: qty 15, 30d supply, fill #0
  Filled 2022-05-23: qty 15, 30d supply, fill #1

## 2022-03-20 ENCOUNTER — Other Ambulatory Visit (HOSPITAL_COMMUNITY): Payer: Self-pay

## 2022-03-21 ENCOUNTER — Other Ambulatory Visit (HOSPITAL_COMMUNITY): Payer: Self-pay

## 2022-03-26 ENCOUNTER — Other Ambulatory Visit (HOSPITAL_COMMUNITY): Payer: Self-pay

## 2022-04-02 ENCOUNTER — Other Ambulatory Visit (HOSPITAL_COMMUNITY): Payer: Self-pay

## 2022-04-03 ENCOUNTER — Other Ambulatory Visit (HOSPITAL_COMMUNITY): Payer: Self-pay

## 2022-04-19 ENCOUNTER — Other Ambulatory Visit (HOSPITAL_COMMUNITY): Payer: Self-pay

## 2022-04-26 ENCOUNTER — Other Ambulatory Visit (HOSPITAL_COMMUNITY): Payer: Self-pay

## 2022-04-26 DIAGNOSIS — E1142 Type 2 diabetes mellitus with diabetic polyneuropathy: Secondary | ICD-10-CM | POA: Diagnosis not present

## 2022-04-26 DIAGNOSIS — G4700 Insomnia, unspecified: Secondary | ICD-10-CM | POA: Diagnosis not present

## 2022-04-26 DIAGNOSIS — J4520 Mild intermittent asthma, uncomplicated: Secondary | ICD-10-CM | POA: Diagnosis not present

## 2022-04-26 MED ORDER — ZOLPIDEM TARTRATE ER 12.5 MG PO TBCR
EXTENDED_RELEASE_TABLET | ORAL | 2 refills | Status: DC
Start: 2022-04-26 — End: 2022-10-19
  Filled 2022-04-26: qty 90, 90d supply, fill #0
  Filled 2022-07-11 (×2): qty 90, 90d supply, fill #1
  Filled 2022-10-18: qty 30, 30d supply, fill #1

## 2022-04-26 MED ORDER — BASAGLAR KWIKPEN 100 UNIT/ML ~~LOC~~ SOPN
PEN_INJECTOR | SUBCUTANEOUS | 5 refills | Status: DC
  Filled 2022-04-26: qty 30, 28d supply, fill #0
  Filled 2022-05-23: qty 30, 28d supply, fill #1
  Filled 2022-07-11: qty 30, 28d supply, fill #2
  Filled 2022-09-20: qty 30, 28d supply, fill #3
  Filled 2022-10-18: qty 30, 28d supply, fill #4
  Filled 2022-11-28: qty 30, 28d supply, fill #5

## 2022-04-26 MED ORDER — TRULICITY 3 MG/0.5ML ~~LOC~~ SOAJ
SUBCUTANEOUS | 5 refills | Status: DC
  Filled 2022-04-26: qty 6, 84d supply, fill #0

## 2022-04-27 ENCOUNTER — Other Ambulatory Visit (HOSPITAL_COMMUNITY): Payer: Self-pay

## 2022-04-30 ENCOUNTER — Other Ambulatory Visit (HOSPITAL_COMMUNITY): Payer: Self-pay

## 2022-05-09 ENCOUNTER — Other Ambulatory Visit (HOSPITAL_COMMUNITY): Payer: Self-pay

## 2022-05-09 MED ORDER — PROMETHAZINE HCL 25 MG PO TABS
ORAL_TABLET | ORAL | 2 refills | Status: DC
  Filled 2022-05-09: qty 180, 90d supply, fill #0
  Filled 2022-09-20: qty 180, 90d supply, fill #1
  Filled 2022-12-30: qty 180, 90d supply, fill #2

## 2022-05-10 ENCOUNTER — Other Ambulatory Visit (HOSPITAL_COMMUNITY): Payer: Self-pay

## 2022-05-11 ENCOUNTER — Other Ambulatory Visit (HOSPITAL_COMMUNITY): Payer: Self-pay

## 2022-05-14 ENCOUNTER — Other Ambulatory Visit (HOSPITAL_COMMUNITY): Payer: Self-pay

## 2022-05-16 DIAGNOSIS — M65332 Trigger finger, left middle finger: Secondary | ICD-10-CM | POA: Diagnosis not present

## 2022-05-16 DIAGNOSIS — R202 Paresthesia of skin: Secondary | ICD-10-CM | POA: Diagnosis not present

## 2022-05-19 ENCOUNTER — Other Ambulatory Visit (HOSPITAL_COMMUNITY): Payer: Self-pay

## 2022-05-23 ENCOUNTER — Other Ambulatory Visit (HOSPITAL_COMMUNITY): Payer: Self-pay

## 2022-05-23 MED ORDER — FLUTICASONE PROPIONATE 50 MCG/ACT NA SUSP
NASAL | 5 refills | Status: DC
  Filled 2022-05-23: qty 16, 30d supply, fill #0
  Filled 2023-02-10: qty 16, 30d supply, fill #1
  Filled 2023-04-25: qty 16, 30d supply, fill #2

## 2022-05-23 MED ORDER — INSULIN LISPRO (1 UNIT DIAL) 100 UNIT/ML (KWIKPEN)
PEN_INJECTOR | SUBCUTANEOUS | 5 refills | Status: DC
  Filled 2022-05-23: qty 12, 26d supply, fill #0

## 2022-05-23 MED ORDER — HYDROXYZINE HCL 25 MG PO TABS
ORAL_TABLET | ORAL | 5 refills | Status: DC
  Filled 2022-05-23: qty 60, 15d supply, fill #0
  Filled 2022-06-16: qty 60, 15d supply, fill #1
  Filled 2022-07-26: qty 60, 15d supply, fill #2
  Filled 2022-08-13: qty 60, 15d supply, fill #3
  Filled 2022-09-08: qty 60, 15d supply, fill #4
  Filled 2022-09-20 – 2022-09-22 (×3): qty 60, 15d supply, fill #5

## 2022-05-24 ENCOUNTER — Other Ambulatory Visit (HOSPITAL_COMMUNITY): Payer: Self-pay

## 2022-06-08 ENCOUNTER — Encounter: Payer: Self-pay | Admitting: Internal Medicine

## 2022-06-08 ENCOUNTER — Ambulatory Visit (INDEPENDENT_AMBULATORY_CARE_PROVIDER_SITE_OTHER): Payer: 59 | Admitting: Internal Medicine

## 2022-06-08 ENCOUNTER — Other Ambulatory Visit (HOSPITAL_COMMUNITY): Payer: Self-pay

## 2022-06-08 VITALS — BP 132/84 | HR 100 | Ht 64.0 in | Wt 273.4 lb

## 2022-06-08 DIAGNOSIS — E11319 Type 2 diabetes mellitus with unspecified diabetic retinopathy without macular edema: Secondary | ICD-10-CM

## 2022-06-08 DIAGNOSIS — E1142 Type 2 diabetes mellitus with diabetic polyneuropathy: Secondary | ICD-10-CM

## 2022-06-08 DIAGNOSIS — E785 Hyperlipidemia, unspecified: Secondary | ICD-10-CM | POA: Diagnosis not present

## 2022-06-08 DIAGNOSIS — E1165 Type 2 diabetes mellitus with hyperglycemia: Secondary | ICD-10-CM | POA: Diagnosis not present

## 2022-06-08 DIAGNOSIS — Z794 Long term (current) use of insulin: Secondary | ICD-10-CM | POA: Diagnosis not present

## 2022-06-08 LAB — POCT GLYCOSYLATED HEMOGLOBIN (HGB A1C): Hemoglobin A1C: 11.1 % — AB (ref 4.0–5.6)

## 2022-06-08 LAB — POCT GLUCOSE (DEVICE FOR HOME USE): Glucose Fasting, POC: 226 mg/dL — AB (ref 70–99)

## 2022-06-08 MED ORDER — HUMALOG KWIKPEN 200 UNIT/ML ~~LOC~~ SOPN
PEN_INJECTOR | SUBCUTANEOUS | 3 refills | Status: DC
  Filled 2022-06-08: qty 12, 28d supply, fill #0
  Filled 2022-07-11: qty 12, 28d supply, fill #1
  Filled 2022-09-20: qty 12, 28d supply, fill #2
  Filled 2022-10-18: qty 12, 28d supply, fill #3
  Filled 2022-11-28: qty 12, 28d supply, fill #4
  Filled 2022-12-26: qty 12, 28d supply, fill #5
  Filled 2023-02-10: qty 12, 28d supply, fill #6
  Filled 2023-03-06: qty 12, 28d supply, fill #7
  Filled 2023-06-05: qty 12, 28d supply, fill #8
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
## 2022-06-08 NOTE — Patient Instructions (Addendum)
-   Continue Basaglar 60 units Twice  - Continue  Trulicity 3  mg weekly  - INcrease Humalog 14 units with each meal  -Humaloglog correctional insulin: ADD extra units on insulin to your meal-time Humalog dose if your blood sugars are higher than 155. Use the scale below to help guide you:   Blood sugar before meal Number of units to inject  Less than 155 0 unit  156 -  180 1 units  181 -  205 2 units  206 -  230 3 units  231 -  255 4 units  256 -  280 5 units  281 -  305 6 units  306 -  330 7 units  331 -  355 8 units  356 - 380 9 units    HOW TO TREAT LOW BLOOD SUGARS (Blood sugar LESS THAN 70 MG/DL) Please follow the RULE OF 15 for the treatment of hypoglycemia treatment (when your (blood sugars are less than 70 mg/dL)   STEP 1: Take 15 grams of carbohydrates when your blood sugar is low, which includes:  3-4 GLUCOSE TABS  OR 3-4 OZ OF JUICE OR REGULAR SODA OR ONE TUBE OF GLUCOSE GEL    STEP 2: RECHECK blood sugar in 15 MINUTES STEP 3: If your blood sugar is still low at the 15 minute recheck --> then, go back to STEP 1 and treat AGAIN with another 15 grams of carbohydrates.

## 2022-06-11 DIAGNOSIS — G5603 Carpal tunnel syndrome, bilateral upper limbs: Secondary | ICD-10-CM | POA: Diagnosis not present

## 2022-06-13 ENCOUNTER — Other Ambulatory Visit (HOSPITAL_COMMUNITY): Payer: Self-pay

## 2022-06-14 ENCOUNTER — Other Ambulatory Visit (HOSPITAL_COMMUNITY): Payer: Self-pay

## 2022-06-14 MED ORDER — PENICILLIN V POTASSIUM 500 MG PO TABS
ORAL_TABLET | ORAL | 0 refills | Status: DC
  Filled 2022-06-14: qty 28, 7d supply, fill #0

## 2022-06-14 MED ORDER — TRAMADOL HCL 50 MG PO TABS
ORAL_TABLET | ORAL | 0 refills | Status: DC
  Filled 2022-06-14: qty 10, 2d supply, fill #0

## 2022-06-14 MED ORDER — FLUCONAZOLE 150 MG PO TABS
ORAL_TABLET | ORAL | 0 refills | Status: DC
  Filled 2022-06-14: qty 2, 2d supply, fill #0

## 2022-06-14 MED ORDER — IBUPROFEN 800 MG PO TABS
ORAL_TABLET | ORAL | 0 refills | Status: DC
  Filled 2022-06-14: qty 18, 4d supply, fill #0

## 2022-06-15 ENCOUNTER — Other Ambulatory Visit (HOSPITAL_COMMUNITY): Payer: Self-pay

## 2022-06-15 MED ORDER — HYDROCODONE-ACETAMINOPHEN 5-325 MG PO TABS
ORAL_TABLET | ORAL | 0 refills | Status: DC
  Filled 2022-06-15: qty 20, 5d supply, fill #0

## 2022-06-16 ENCOUNTER — Other Ambulatory Visit (HOSPITAL_COMMUNITY): Payer: Self-pay

## 2022-06-18 ENCOUNTER — Other Ambulatory Visit (HOSPITAL_COMMUNITY): Payer: Self-pay

## 2022-06-18 ENCOUNTER — Encounter (HOSPITAL_COMMUNITY): Payer: Self-pay | Admitting: Pharmacist

## 2022-06-18 MED ORDER — GABAPENTIN 600 MG PO TABS
ORAL_TABLET | ORAL | 5 refills | Status: DC
  Filled 2022-06-18: qty 90, 30d supply, fill #0
  Filled 2022-07-26: qty 90, 30d supply, fill #1
  Filled 2022-08-22: qty 90, 30d supply, fill #2
  Filled 2022-09-20: qty 90, 30d supply, fill #3
  Filled 2022-10-26 – 2022-10-27 (×2): qty 90, 30d supply, fill #4
  Filled 2022-10-27: qty 20, 7d supply, fill #4
  Filled 2022-10-27: qty 70, 23d supply, fill #4
  Filled 2022-11-28: qty 90, 30d supply, fill #5

## 2022-06-19 ENCOUNTER — Other Ambulatory Visit (HOSPITAL_COMMUNITY): Payer: Self-pay

## 2022-06-19 DIAGNOSIS — G5603 Carpal tunnel syndrome, bilateral upper limbs: Secondary | ICD-10-CM | POA: Diagnosis not present

## 2022-06-21 ENCOUNTER — Other Ambulatory Visit (HOSPITAL_COMMUNITY): Payer: Self-pay

## 2022-07-11 ENCOUNTER — Other Ambulatory Visit (HOSPITAL_COMMUNITY): Payer: Self-pay

## 2022-07-11 MED ORDER — TRULICITY 3 MG/0.5ML ~~LOC~~ SOAJ
SUBCUTANEOUS | 5 refills | Status: DC
  Filled 2022-07-11: qty 4, 56d supply, fill #0
  Filled 2022-09-08: qty 4, 56d supply, fill #1

## 2022-07-12 ENCOUNTER — Other Ambulatory Visit (HOSPITAL_COMMUNITY): Payer: Self-pay

## 2022-07-12 DIAGNOSIS — T753XXA Motion sickness, initial encounter: Secondary | ICD-10-CM | POA: Diagnosis not present

## 2022-07-12 DIAGNOSIS — E1142 Type 2 diabetes mellitus with diabetic polyneuropathy: Secondary | ICD-10-CM | POA: Diagnosis not present

## 2022-07-12 DIAGNOSIS — N951 Menopausal and female climacteric states: Secondary | ICD-10-CM | POA: Diagnosis not present

## 2022-07-12 DIAGNOSIS — J4520 Mild intermittent asthma, uncomplicated: Secondary | ICD-10-CM | POA: Diagnosis not present

## 2022-07-12 DIAGNOSIS — G5603 Carpal tunnel syndrome, bilateral upper limbs: Secondary | ICD-10-CM | POA: Diagnosis not present

## 2022-07-12 MED ORDER — ESOMEPRAZOLE MAGNESIUM 40 MG PO CPDR
40.0000 mg | DELAYED_RELEASE_CAPSULE | Freq: Every day | ORAL | 2 refills | Status: DC
  Filled 2022-07-12: qty 90, 90d supply, fill #0
  Filled 2022-12-26 – 2022-12-30 (×2): qty 90, 90d supply, fill #1
  Filled 2023-03-24: qty 90, 90d supply, fill #2
  Filled 2023-03-29: qty 30, 30d supply, fill #2
  Filled 2023-04-11: qty 60, 60d supply, fill #3

## 2022-07-12 MED ORDER — PREMARIN 0.3 MG PO TABS
0.3000 mg | ORAL_TABLET | Freq: Every day | ORAL | 2 refills | Status: DC
  Filled 2022-07-12: qty 30, 30d supply, fill #0

## 2022-07-12 MED ORDER — TIZANIDINE HCL 4 MG PO TABS
4.0000 mg | ORAL_TABLET | Freq: Two times a day (BID) | ORAL | 2 refills | Status: DC | PRN
Start: 2022-07-12 — End: 2023-04-17
  Filled 2022-07-12: qty 180, 90d supply, fill #0
  Filled 2022-12-26 – 2022-12-30 (×2): qty 180, 90d supply, fill #1
  Filled 2023-03-24 – 2023-03-29 (×2): qty 180, 90d supply, fill #2

## 2022-07-12 MED ORDER — SCOPOLAMINE 1 MG/3DAYS TD PT72
MEDICATED_PATCH | TRANSDERMAL | 0 refills | Status: DC
  Filled 2022-07-12: qty 5, 15d supply, fill #0

## 2022-07-12 MED ORDER — POTASSIUM CHLORIDE ER 10 MEQ PO TBCR
10.0000 meq | EXTENDED_RELEASE_TABLET | Freq: Every day | ORAL | 5 refills | Status: DC
  Filled 2022-07-12 – 2022-08-13 (×2): qty 30, 30d supply, fill #0
  Filled 2022-09-08: qty 30, 30d supply, fill #1
  Filled 2022-10-18: qty 30, 30d supply, fill #2
  Filled 2022-11-14: qty 30, 30d supply, fill #3
  Filled 2022-12-14: qty 30, 30d supply, fill #4
  Filled 2023-01-11: qty 30, 30d supply, fill #5

## 2022-07-12 MED ORDER — LEVOCETIRIZINE DIHYDROCHLORIDE 5 MG PO TABS
5.0000 mg | ORAL_TABLET | Freq: Every day | ORAL | 2 refills | Status: DC
  Filled 2022-07-12: qty 90, 90d supply, fill #0
  Filled 2022-10-18: qty 90, 90d supply, fill #1
  Filled 2023-03-06: qty 90, 90d supply, fill #2

## 2022-07-12 MED ORDER — ZOLPIDEM TARTRATE ER 12.5 MG PO TBCR
12.5000 mg | EXTENDED_RELEASE_TABLET | Freq: Every evening | ORAL | 2 refills | Status: DC | PRN
  Filled 2022-07-12 – 2022-07-26 (×2): qty 90, 90d supply, fill #0
  Filled 2022-10-26: qty 90, 90d supply, fill #1

## 2022-07-13 ENCOUNTER — Other Ambulatory Visit (HOSPITAL_COMMUNITY): Payer: Self-pay

## 2022-07-16 DIAGNOSIS — G5602 Carpal tunnel syndrome, left upper limb: Secondary | ICD-10-CM | POA: Diagnosis not present

## 2022-07-16 DIAGNOSIS — G5601 Carpal tunnel syndrome, right upper limb: Secondary | ICD-10-CM | POA: Diagnosis not present

## 2022-07-16 DIAGNOSIS — Z6841 Body Mass Index (BMI) 40.0 and over, adult: Secondary | ICD-10-CM | POA: Diagnosis not present

## 2022-07-20 ENCOUNTER — Other Ambulatory Visit (HOSPITAL_COMMUNITY): Payer: Self-pay

## 2022-07-21 ENCOUNTER — Other Ambulatory Visit (HOSPITAL_COMMUNITY): Payer: Self-pay

## 2022-07-26 ENCOUNTER — Other Ambulatory Visit (HOSPITAL_COMMUNITY): Payer: Self-pay

## 2022-07-26 DIAGNOSIS — F4312 Post-traumatic stress disorder, chronic: Secondary | ICD-10-CM | POA: Diagnosis not present

## 2022-07-26 DIAGNOSIS — E1142 Type 2 diabetes mellitus with diabetic polyneuropathy: Secondary | ICD-10-CM | POA: Diagnosis not present

## 2022-07-26 DIAGNOSIS — N951 Menopausal and female climacteric states: Secondary | ICD-10-CM | POA: Diagnosis not present

## 2022-07-26 MED ORDER — PREMARIN 0.625 MG PO TABS
ORAL_TABLET | Freq: Every day | ORAL | 2 refills | Status: DC
  Filled 2022-07-26: qty 30, 30d supply, fill #0
  Filled 2022-09-05: qty 30, 30d supply, fill #1
  Filled 2022-10-18: qty 30, 30d supply, fill #2

## 2022-07-26 MED ORDER — SERTRALINE HCL 25 MG PO TABS
ORAL_TABLET | ORAL | 2 refills | Status: DC
  Filled 2022-07-26: qty 30, 30d supply, fill #0

## 2022-07-26 MED ORDER — FLUCONAZOLE 150 MG PO TABS
ORAL_TABLET | ORAL | 0 refills | Status: DC
  Filled 2022-07-26: qty 2, 30d supply, fill #0

## 2022-08-09 ENCOUNTER — Other Ambulatory Visit (HOSPITAL_COMMUNITY): Payer: Self-pay

## 2022-08-09 DIAGNOSIS — E1142 Type 2 diabetes mellitus with diabetic polyneuropathy: Secondary | ICD-10-CM | POA: Diagnosis not present

## 2022-08-09 DIAGNOSIS — F4312 Post-traumatic stress disorder, chronic: Secondary | ICD-10-CM | POA: Diagnosis not present

## 2022-08-09 DIAGNOSIS — J4520 Mild intermittent asthma, uncomplicated: Secondary | ICD-10-CM | POA: Diagnosis not present

## 2022-08-09 DIAGNOSIS — N951 Menopausal and female climacteric states: Secondary | ICD-10-CM | POA: Diagnosis not present

## 2022-08-09 MED ORDER — SERTRALINE HCL 50 MG PO TABS
50.0000 mg | ORAL_TABLET | Freq: Every evening | ORAL | 2 refills | Status: DC
Start: 2022-08-09 — End: 2022-11-14
  Filled 2022-08-09: qty 30, 30d supply, fill #0
  Filled 2022-09-05: qty 30, 30d supply, fill #1
  Filled 2022-10-18: qty 30, 30d supply, fill #2

## 2022-08-13 ENCOUNTER — Other Ambulatory Visit (HOSPITAL_COMMUNITY): Payer: Self-pay

## 2022-08-14 ENCOUNTER — Other Ambulatory Visit (HOSPITAL_COMMUNITY): Payer: Self-pay

## 2022-08-22 ENCOUNTER — Other Ambulatory Visit (HOSPITAL_COMMUNITY): Payer: Self-pay

## 2022-09-03 ENCOUNTER — Other Ambulatory Visit (HOSPITAL_COMMUNITY): Payer: Self-pay

## 2022-09-04 ENCOUNTER — Other Ambulatory Visit (HOSPITAL_COMMUNITY): Payer: Self-pay

## 2022-09-04 MED ORDER — TOBRAMYCIN-DEXAMETHASONE 0.3-0.1 % OP SUSP
OPHTHALMIC | 0 refills | Status: DC
  Filled 2022-09-04: qty 5, 7d supply, fill #0

## 2022-09-05 ENCOUNTER — Other Ambulatory Visit (HOSPITAL_COMMUNITY): Payer: Self-pay

## 2022-09-10 ENCOUNTER — Other Ambulatory Visit (HOSPITAL_COMMUNITY): Payer: Self-pay

## 2022-09-11 ENCOUNTER — Other Ambulatory Visit (HOSPITAL_COMMUNITY): Payer: Self-pay

## 2022-09-20 ENCOUNTER — Other Ambulatory Visit (HOSPITAL_COMMUNITY): Payer: Self-pay

## 2022-09-21 ENCOUNTER — Other Ambulatory Visit (HOSPITAL_COMMUNITY): Payer: Self-pay

## 2022-09-22 ENCOUNTER — Other Ambulatory Visit (HOSPITAL_COMMUNITY): Payer: Self-pay

## 2022-09-24 ENCOUNTER — Other Ambulatory Visit (HOSPITAL_COMMUNITY): Payer: Self-pay

## 2022-10-18 ENCOUNTER — Other Ambulatory Visit (HOSPITAL_COMMUNITY): Payer: Self-pay

## 2022-10-18 ENCOUNTER — Other Ambulatory Visit: Payer: Self-pay | Admitting: Internal Medicine

## 2022-10-18 DIAGNOSIS — E785 Hyperlipidemia, unspecified: Secondary | ICD-10-CM

## 2022-10-18 MED ORDER — ROSUVASTATIN CALCIUM 40 MG PO TABS
40.0000 mg | ORAL_TABLET | Freq: Every day | ORAL | 0 refills | Status: DC
  Filled 2022-10-18: qty 90, 90d supply, fill #0

## 2022-10-19 ENCOUNTER — Encounter: Payer: Self-pay | Admitting: Internal Medicine

## 2022-10-19 ENCOUNTER — Other Ambulatory Visit (HOSPITAL_COMMUNITY): Payer: Self-pay

## 2022-10-19 ENCOUNTER — Ambulatory Visit: Payer: 59 | Admitting: Internal Medicine

## 2022-10-19 VITALS — BP 144/92 | HR 84 | Ht 64.0 in | Wt 273.0 lb

## 2022-10-19 DIAGNOSIS — Z794 Long term (current) use of insulin: Secondary | ICD-10-CM

## 2022-10-19 DIAGNOSIS — E11319 Type 2 diabetes mellitus with unspecified diabetic retinopathy without macular edema: Secondary | ICD-10-CM | POA: Diagnosis not present

## 2022-10-19 DIAGNOSIS — E1165 Type 2 diabetes mellitus with hyperglycemia: Secondary | ICD-10-CM | POA: Diagnosis not present

## 2022-10-19 DIAGNOSIS — E785 Hyperlipidemia, unspecified: Secondary | ICD-10-CM

## 2022-10-19 DIAGNOSIS — E1142 Type 2 diabetes mellitus with diabetic polyneuropathy: Secondary | ICD-10-CM

## 2022-10-19 LAB — POCT GLYCOSYLATED HEMOGLOBIN (HGB A1C): Hemoglobin A1C: 10.7 % — AB (ref 4.0–5.6)

## 2022-10-19 MED ORDER — METFORMIN HCL ER 500 MG PO TB24
500.0000 mg | ORAL_TABLET | Freq: Two times a day (BID) | ORAL | 2 refills | Status: DC
  Filled 2022-10-19: qty 180, 90d supply, fill #0
  Filled 2023-01-14: qty 180, 90d supply, fill #1

## 2022-10-19 MED ORDER — FREESTYLE LIBRE 3 SENSOR MISC
1.0000 | 3 refills | Status: DC
  Filled 2022-10-19: qty 2, 28d supply, fill #0
  Filled 2022-11-14: qty 2, 28d supply, fill #1
  Filled 2022-12-26: qty 2, 28d supply, fill #2
  Filled 2023-01-20: qty 2, 28d supply, fill #3
  Filled 2023-02-27: qty 2, 28d supply, fill #4
  Filled 2023-03-24: qty 2, 28d supply, fill #5

## 2022-10-19 MED ORDER — TRULICITY 3 MG/0.5ML ~~LOC~~ SOAJ
3.0000 mg | SUBCUTANEOUS | 3 refills | Status: DC
  Filled 2022-10-19: qty 6, 84d supply, fill #0
  Filled 2022-11-14: qty 2, 28d supply, fill #0
  Filled 2022-12-14: qty 2, 28d supply, fill #1
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
## 2022-10-19 NOTE — Patient Instructions (Addendum)
-   Start Metformin 500 mg XR twice daily  - Change  Basaglar 60 units in the Morning and 50 units at night  - Continue  Trulicity 3  mg weekly  - INcrease Humalog 16 units with each meal  -Humaloglog correctional insulin: ADD extra units on insulin to your meal-time Humalog dose if your blood sugars are higher than 155. Use the scale below to help guide you:   Blood sugar before meal Number of units to inject  Less than 155 0 unit  156 -  180 1 units  181 -  205 2 units  206 -  230 3 units  231 -  255 4 units  256 -  280 5 units  281 -  305 6 units  306 -  330 7 units  331 -  355 8 units  356 - 380 9 units    HOW TO TREAT LOW BLOOD SUGARS (Blood sugar LESS THAN 70 MG/DL) Please follow the RULE OF 15 for the treatment of hypoglycemia treatment (when your (blood sugars are less than 70 mg/dL)   STEP 1: Take 15 grams of carbohydrates when your blood sugar is low, which includes:  3-4 GLUCOSE TABS  OR 3-4 OZ OF JUICE OR REGULAR SODA OR ONE TUBE OF GLUCOSE GEL    STEP 2: RECHECK blood sugar in 15 MINUTES STEP 3: If your blood sugar is still low at the 15 minute recheck --> then, go back to STEP 1 and treat AGAIN with another 15 grams of carbohydrates.

## 2022-10-26 ENCOUNTER — Other Ambulatory Visit (HOSPITAL_COMMUNITY): Payer: Self-pay

## 2022-10-27 ENCOUNTER — Other Ambulatory Visit (HOSPITAL_COMMUNITY): Payer: Self-pay

## 2022-10-29 ENCOUNTER — Other Ambulatory Visit (HOSPITAL_COMMUNITY): Payer: Self-pay

## 2022-10-29 MED ORDER — HYDROXYZINE HCL 25 MG PO TABS
25.0000 mg | ORAL_TABLET | Freq: Four times a day (QID) | ORAL | 5 refills | Status: DC | PRN
  Filled 2022-10-29: qty 60, 15d supply, fill #0
  Filled 2022-11-28: qty 60, 15d supply, fill #1
  Filled 2022-12-26: qty 60, 15d supply, fill #2
  Filled 2023-01-20: qty 60, 15d supply, fill #3
  Filled 2023-05-05: qty 60, 15d supply, fill #4
  Filled 2023-06-09: qty 60, 15d supply, fill #5

## 2022-10-30 ENCOUNTER — Other Ambulatory Visit (HOSPITAL_COMMUNITY): Payer: Self-pay

## 2022-10-30 ENCOUNTER — Other Ambulatory Visit (HOSPITAL_BASED_OUTPATIENT_CLINIC_OR_DEPARTMENT_OTHER): Payer: Self-pay

## 2022-10-30 ENCOUNTER — Encounter (HOSPITAL_COMMUNITY): Payer: Self-pay

## 2022-10-31 ENCOUNTER — Other Ambulatory Visit (HOSPITAL_COMMUNITY): Payer: Self-pay

## 2022-11-05 ENCOUNTER — Other Ambulatory Visit (HOSPITAL_COMMUNITY): Payer: Self-pay

## 2022-11-05 MED ORDER — ZOLPIDEM TARTRATE ER 12.5 MG PO TBCR
12.5000 mg | EXTENDED_RELEASE_TABLET | Freq: Every evening | ORAL | 2 refills | Status: DC | PRN
  Filled 2022-11-05 – 2023-01-24 (×3): qty 90, 90d supply, fill #0
  Filled 2023-04-24: qty 90, 90d supply, fill #1

## 2022-11-05 MED ORDER — ZOLPIDEM TARTRATE 10 MG PO TABS
10.0000 mg | ORAL_TABLET | Freq: Every evening | ORAL | 2 refills | Status: DC | PRN
  Filled 2022-11-05: qty 90, 90d supply, fill #0

## 2022-11-14 ENCOUNTER — Other Ambulatory Visit (HOSPITAL_COMMUNITY): Payer: Self-pay

## 2022-11-14 MED ORDER — SERTRALINE HCL 50 MG PO TABS
50.0000 mg | ORAL_TABLET | Freq: Every evening | ORAL | 5 refills | Status: DC
  Filled 2022-11-14: qty 30, 30d supply, fill #0
  Filled 2022-12-14: qty 30, 30d supply, fill #1
  Filled 2023-01-11: qty 30, 30d supply, fill #2
  Filled 2023-02-10: qty 30, 30d supply, fill #3
  Filled 2023-03-06: qty 30, 30d supply, fill #4
  Filled 2023-04-11: qty 30, 30d supply, fill #5

## 2022-11-14 MED ORDER — TOBRAMYCIN-DEXAMETHASONE 0.3-0.1 % OP SUSP
OPHTHALMIC | 5 refills | Status: AC
  Filled 2022-11-14: qty 5, 6d supply, fill #0
  Filled 2023-02-10: qty 5, 6d supply, fill #1
  Filled 2023-04-25: qty 5, 6d supply, fill #2
  Filled 2023-08-04: qty 5, 6d supply, fill #3

## 2022-11-15 ENCOUNTER — Other Ambulatory Visit (HOSPITAL_COMMUNITY): Payer: Self-pay

## 2022-11-15 MED ORDER — METRONIDAZOLE 500 MG PO TABS
500.0000 mg | ORAL_TABLET | Freq: Two times a day (BID) | ORAL | 0 refills | Status: DC
  Filled 2022-11-15: qty 14, 7d supply, fill #0

## 2022-11-15 MED ORDER — FLUCONAZOLE 150 MG PO TABS
150.0000 mg | ORAL_TABLET | ORAL | 0 refills | Status: DC
  Filled 2022-11-15: qty 2, 14d supply, fill #0

## 2022-11-16 ENCOUNTER — Other Ambulatory Visit (HOSPITAL_COMMUNITY): Payer: Self-pay

## 2022-11-28 ENCOUNTER — Other Ambulatory Visit (HOSPITAL_COMMUNITY): Payer: Self-pay

## 2022-11-29 ENCOUNTER — Other Ambulatory Visit: Payer: Self-pay

## 2022-11-29 ENCOUNTER — Other Ambulatory Visit (HOSPITAL_COMMUNITY): Payer: Self-pay

## 2022-11-29 MED ORDER — ESTROGENS CONJUGATED 0.625 MG PO TABS
0.6250 mg | ORAL_TABLET | Freq: Every day | ORAL | 2 refills | Status: DC
  Filled 2022-11-29: qty 30, 30d supply, fill #0
  Filled 2022-12-26: qty 30, 30d supply, fill #1
  Filled 2023-05-27: qty 30, 30d supply, fill #2

## 2022-11-29 MED ORDER — NOREL AD 4-10-325 MG PO TABS
1.0000 | ORAL_TABLET | Freq: Two times a day (BID) | ORAL | 2 refills | Status: DC | PRN
  Filled 2022-11-29: qty 20, 10d supply, fill #0
  Filled 2023-01-20: qty 20, 10d supply, fill #1
  Filled 2023-04-11: qty 20, 10d supply, fill #2

## 2022-11-30 ENCOUNTER — Other Ambulatory Visit: Payer: Self-pay

## 2022-11-30 ENCOUNTER — Emergency Department (HOSPITAL_BASED_OUTPATIENT_CLINIC_OR_DEPARTMENT_OTHER)
Admission: EM | Admit: 2022-11-30 | Discharge: 2022-11-30 | Disposition: A | Discharge status: Home / Self Care | Payer: 59 | Attending: Emergency Medicine | Admitting: Emergency Medicine

## 2022-11-30 ENCOUNTER — Encounter (HOSPITAL_BASED_OUTPATIENT_CLINIC_OR_DEPARTMENT_OTHER): Payer: Self-pay | Admitting: Emergency Medicine

## 2022-11-30 ENCOUNTER — Other Ambulatory Visit (HOSPITAL_COMMUNITY): Payer: Self-pay

## 2022-11-30 DIAGNOSIS — E119 Type 2 diabetes mellitus without complications: Secondary | ICD-10-CM | POA: Diagnosis not present

## 2022-11-30 DIAGNOSIS — Z7984 Long term (current) use of oral hypoglycemic drugs: Secondary | ICD-10-CM | POA: Diagnosis not present

## 2022-11-30 DIAGNOSIS — Z794 Long term (current) use of insulin: Secondary | ICD-10-CM | POA: Diagnosis not present

## 2022-11-30 DIAGNOSIS — Z79899 Other long term (current) drug therapy: Secondary | ICD-10-CM | POA: Diagnosis not present

## 2022-11-30 DIAGNOSIS — Z20822 Contact with and (suspected) exposure to covid-19: Secondary | ICD-10-CM | POA: Diagnosis not present

## 2022-11-30 DIAGNOSIS — I10 Essential (primary) hypertension: Secondary | ICD-10-CM | POA: Insufficient documentation

## 2022-11-30 DIAGNOSIS — R0602 Shortness of breath: Secondary | ICD-10-CM | POA: Diagnosis present

## 2022-11-30 DIAGNOSIS — B338 Other specified viral diseases: Secondary | ICD-10-CM

## 2022-11-30 DIAGNOSIS — B974 Respiratory syncytial virus as the cause of diseases classified elsewhere: Secondary | ICD-10-CM | POA: Insufficient documentation

## 2022-11-30 DIAGNOSIS — K122 Cellulitis and abscess of mouth: Secondary | ICD-10-CM | POA: Insufficient documentation

## 2022-11-30 LAB — RESP PANEL BY RT-PCR (RSV, FLU A&B, COVID)  RVPGX2
Influenza A by PCR: NEGATIVE
Influenza B by PCR: NEGATIVE
Resp Syncytial Virus by PCR: POSITIVE — AB
SARS Coronavirus 2 by RT PCR: NEGATIVE

## 2022-11-30 LAB — GROUP A STREP BY PCR: Group A Strep by PCR: NOT DETECTED

## 2022-11-30 MED ORDER — KETOROLAC TROMETHAMINE 60 MG/2ML IM SOLN
30.0000 mg | Freq: Once | INTRAMUSCULAR | Status: AC
  Administered 2022-11-30: 30 mg via INTRAMUSCULAR
  Filled 2022-11-30: qty 2

## 2022-11-30 MED ORDER — DEXAMETHASONE SODIUM PHOSPHATE 10 MG/ML IJ SOLN
10.0000 mg | Freq: Once | INTRAMUSCULAR | Status: AC
  Administered 2022-11-30: 10 mg via INTRAMUSCULAR
  Filled 2022-11-30: qty 1

## 2022-11-30 NOTE — ED Notes (Signed)
Discharge instructions and follow up care reviewed and explained, pt verbalized understanding and had no further questions on d/c. Pt caox4 and ambulatory on d/c.  

## 2022-11-30 NOTE — ED Triage Notes (Signed)
Pt arrives to ED with c/o sore throat, runny nose, headache, SOB, left ear fullness.

## 2022-11-30 NOTE — Discharge Instructions (Signed)
Use Debrox to help with wax  in your ear Contact a health care provider if: Your symptoms get worse or have not changed after 2 weeks. You have: A fever. Hot flashes, sweating, or chills that keep happening. A cough that brings up much more mucus than usual. A cough that brings up blood. You feel: Very tired (lethargic). Confused. Get help right away if: You have increased or severe trouble breathing. You lose consciousness.

## 2022-11-30 NOTE — ED Provider Notes (Signed)
[facility] EMERGENCY DEPT Provider Note   CSN: [id number] Arrival date & time: 11/30/22  1242     History  Chief Complaint  Patient presents with   Sore Throat   Shortness of Breath    Sharon Gregory is a 41 y.o. female who presents to the emergency department with URI symptoms.  She complains of sore throat particularly in her uvula, decreased hearing in the left ear, chest congestion, head congestion and feeling "miserable."  She believes she was exposed to the virus by her daughter.  She has a history of insulin-dependent type 2 diabetes and hypertension.  She has been taking over-the-counter cold medicines without improvement in her symptoms.  She has taken steroids in the past when she has had symptoms like this and states that she uses sliding scale insulin to cover for increases in her blood sugars.  She denies fever or vomiting.   Sore Throat Associated symptoms include shortness of breath.  Shortness of Breath      Home Medications Prior to Admission medications   Medication Sig Start Date End Date Taking? Authorizing Provider  Blood Glucose Monitoring Suppl (FREESTYLE LITE) w/Device KIT Use as directed to test glucose levels three times daily. 10/16/21     Chlorphen-PE-Acetaminophen (NOREL AD) 4-10-325 MG TABS Take 1 tablet by mouth 2 (two) times daily as needed for cough and congestion. 11/29/22     Continuous Blood Gluc Sensor (FREESTYLE LIBRE 3 SENSOR) MISC Place 1 sensor on the skin every 14 days. Use to check glucose continuously 10/19/22   Shamleffer, Melanie Crazier, MD  Dulaglutide (TRULICITY) 3 YQ/6.5HQ SOPN Inject 3 mg into the skin once a week. 10/19/22   Shamleffer, Melanie Crazier, MD  esomeprazole (NEXIUM) 40 MG capsule Take 1 capsule by mouth daily. 07/12/22     estrogens, conjugated, (PREMARIN) 0.625 MG tablet Take 1 tablet (0.625 mg total) by mouth daily. 11/30/22     fluconazole (DIFLUCAN) 150 MG tablet Take 1 tablet (150 mg total) by  mouth once a week as needed. 11/15/22     Fluocinolone Acetonide 0.01 % OIL Apply topically in affected ear(s) twice daily as needed. 07/05/21     fluticasone (FLONASE) 50 MCG/ACT nasal spray Place 2 sprays into both nostrils daily. 05/23/22     gabapentin (NEURONTIN) 600 MG tablet Take 1 tablet by mouth 3 times a day. 11/22/21     glucose blood (FREESTYLE LITE) test strip Use as directed to test blood glucose levels three times daily. 10/16/21     hydrOXYzine (ATARAX) 25 MG tablet Take 1 tablet (25 mg total) by mouth every 6 (six) hours as needed for itching. 10/29/22     Insulin Glargine (BASAGLAR KWIKPEN) 100 UNIT/ML Inject 60 units under the skin in the morning and inject 60 units under the skin at bedtime. 04/26/22     insulin lispro (HUMALOG KWIKPEN) 200 UNIT/ML KwikPen Inject as directed (Max 80 units daily per scale) 06/08/22   Shamleffer, Melanie Crazier, MD  Insulin Pen Needle (UNIFINE PENTIPS) 32G X 6 MM MISC Use in the morning, at noon, in the evening, and at bedtime as directed 09/29/21   Shamleffer, Melanie Crazier, MD  Lancets (FREESTYLE) lancets Use as directed to test blood glucose levels three times daily. 10/16/21     levocetirizine (XYZAL) 5 MG tablet Take 1 tablet by mouth everyday. 02/01/22   Nolene Ebbs, MD  levocetirizine (XYZAL) 5 MG tablet Take 1 tablet by mouth daily. 07/12/22   Nolene Ebbs, MD  meloxicam [facility])  15 MG tablet Take 1 tablet (15 mg total) by mouth daily with food 11/22/21     metFORMIN (GLUCOPHAGE-XR) 500 MG 24 hr tablet Take 1 tablet (500 mg total) by mouth 2 (two) times daily with a meal. 10/19/22   Shamleffer, Melanie Crazier, MD  metroNIDAZOLE (FLAGYL) 500 MG tablet Take 1 tablet (500 mg total) by mouth 2 (two) times daily. 11/15/22     potassium chloride (K-TAB) 10 MEQ tablet Take 1 tablet by mouth daily. 07/12/22     promethazine (PHENERGAN) 25 MG tablet TAKE 1 TABLET BY MOUTH 2 TIMES DAILY AS NEEDED 05/09/22 05/09/23    rosuvastatin (CRESTOR) 40 MG tablet Take  1 tablet by mouth daily. 10/18/22   Shamleffer, Melanie Crazier, MD  sertraline (ZOLOFT) 50 MG tablet Take 1 tablet (50 mg total) by mouth every evening. 11/14/22     tiZANidine (ZANAFLEX) 4 MG tablet Take 1 tablet by mouth 2 times daily as needed. 07/12/22     tobramycin-dexamethasone (TOBRADEX) ophthalmic solution Shake liquid and place 2 drops in affected eye(s) every 6 hours as needed. 11/14/22     zolpidem (AMBIEN CR) 12.5 MG CR tablet Take 1 tablet by mouth at bedtime as needed. 07/12/22     zolpidem (AMBIEN CR) 12.5 MG CR tablet Take 1 tablet (12.5 mg total) by mouth at bedtime as needed. 11/05/22     zolpidem (AMBIEN) 10 MG tablet Take 1 tablet (10 mg total) by mouth at bedtime as needed. 11/05/22         Allergies    Kiwi extract, Molds & smuts, and Strawberry extract    Review of Systems   Review of Systems  Respiratory:  Positive for shortness of breath.     Physical Exam Updated Vital Signs BP (!) 159/104 (BP Location: Right Arm)   Pulse 99   Temp 98 F (36.7 C) (Oral)   Resp 17   Ht 5' 4.75" (1.645 m)   Wt 122.9 kg   SpO2 100%   BMI 45.45 kg/m  Physical Exam HENT:     Head: Normocephalic and atraumatic.     Right Ear: Tympanic membrane normal.     Left Ear: Tympanic membrane normal.     Nose: Congestion present.     Mouth/Throat:     Pharynx: Uvula midline. Pharyngeal swelling and posterior oropharyngeal erythema present.     Tonsils: No tonsillar exudate or tonsillar abscesses.  Eyes:     Conjunctiva/sclera: Conjunctivae normal.     Pupils: Pupils are equal, round, and reactive to light.  Cardiovascular:     Rate and Rhythm: Normal rate.  Pulmonary:     Effort: Pulmonary effort is normal.     Breath sounds: No wheezing or rhonchi.  Abdominal:     General: There is no distension.  Lymphadenopathy:     Cervical: Cervical adenopathy present.  Skin:    General: Skin is warm.  Neurological:     General: No focal deficit present.     Mental Status: She is alert  and oriented to person, place, and time.     ED Results / Procedures / Treatments   Labs (all labs ordered are listed, but only abnormal results are displayed) Labs Reviewed  RESP PANEL BY RT-PCR (RSV, FLU A&B, COVID)  [id number] - Abnormal; Notable for the following components:      Result Value   Resp Syncytial Virus by PCR POSITIVE (*)    All other components within normal limits  GROUP A STREP BY PCR  EKG None  Radiology No results found.  Procedures Procedures    Medications Ordered in ED Medications  dexamethasone (DECADRON) injection 10 mg (10 mg Intramuscular Given 11/30/22 1400)  ketorolac (TORADOL) injection 30 mg (30 mg Intramuscular Given 11/30/22 1400)    ED Course/ Medical Decision Making/ A&P                           Medical Decision Making Patient here with flu URI sxs. + for RSV. She has associated uvulitis. Tx here with toradol and decadron. Sliding scale insulin at home; + hypertension but otherwise HDS. Lungs are clear, afebrile. Discussed supportive care and return precautions  Risk Prescription drug management.          Final Clinical Impression(s) / ED Diagnoses Final diagnoses:  RSV infection  Uvulitis    Rx / DC Orders ED Discharge Orders     None         Margarita Mail, PA-C 11/30/22 1404    Wyvonnia Dusky, MD 11/30/22 1526

## 2022-12-04 ENCOUNTER — Other Ambulatory Visit (HOSPITAL_COMMUNITY): Payer: Self-pay

## 2022-12-14 ENCOUNTER — Other Ambulatory Visit: Payer: Self-pay

## 2022-12-14 ENCOUNTER — Other Ambulatory Visit (HOSPITAL_COMMUNITY): Payer: Self-pay

## 2022-12-26 ENCOUNTER — Other Ambulatory Visit: Payer: Self-pay

## 2022-12-26 ENCOUNTER — Other Ambulatory Visit (HOSPITAL_COMMUNITY): Payer: Self-pay

## 2022-12-26 MED ORDER — FLUCONAZOLE 150 MG PO TABS
150.0000 mg | ORAL_TABLET | ORAL | 0 refills | Status: DC
  Filled 2022-12-26 – 2022-12-28 (×2): qty 2, 14d supply, fill #0

## 2022-12-26 MED ORDER — GABAPENTIN 600 MG PO TABS
600.0000 mg | ORAL_TABLET | Freq: Three times a day (TID) | ORAL | 5 refills | Status: DC
  Filled 2022-12-26: qty 90, 30d supply, fill #0
  Filled 2023-01-20: qty 90, 30d supply, fill #1
  Filled 2023-02-27: qty 90, 30d supply, fill #2
  Filled 2023-03-06 – 2023-03-29 (×3): qty 90, 30d supply, fill #3
  Filled 2023-04-29: qty 90, 30d supply, fill #4
  Filled 2023-05-27: qty 90, 30d supply, fill #5

## 2022-12-27 ENCOUNTER — Other Ambulatory Visit: Payer: Self-pay

## 2022-12-27 ENCOUNTER — Other Ambulatory Visit (HOSPITAL_COMMUNITY): Payer: Self-pay

## 2022-12-28 ENCOUNTER — Other Ambulatory Visit (HOSPITAL_COMMUNITY): Payer: Self-pay

## 2022-12-28 ENCOUNTER — Other Ambulatory Visit: Payer: Self-pay

## 2022-12-31 ENCOUNTER — Encounter: Payer: Self-pay | Admitting: Internal Medicine

## 2022-12-31 ENCOUNTER — Other Ambulatory Visit: Payer: Self-pay

## 2022-12-31 ENCOUNTER — Other Ambulatory Visit (HOSPITAL_COMMUNITY): Payer: Self-pay

## 2022-12-31 ENCOUNTER — Other Ambulatory Visit: Payer: Self-pay | Admitting: Internal Medicine

## 2022-12-31 MED ORDER — TOUJEO MAX SOLOSTAR 300 UNIT/ML ~~LOC~~ SOPN
60.0000 [IU] | PEN_INJECTOR | Freq: Two times a day (BID) | SUBCUTANEOUS | 3 refills | Status: DC
  Filled 2022-12-31: qty 15, 38d supply, fill #0

## 2022-12-31 MED ORDER — RYBELSUS 7 MG PO TABS
7.0000 mg | ORAL_TABLET | Freq: Every day | ORAL | 3 refills | Status: DC
  Filled 2022-12-31: qty 30, 30d supply, fill #0
  Filled 2023-02-10: qty 90, 90d supply, fill #1

## 2023-01-01 ENCOUNTER — Other Ambulatory Visit (HOSPITAL_COMMUNITY): Payer: Self-pay

## 2023-01-01 ENCOUNTER — Other Ambulatory Visit: Payer: Self-pay | Admitting: Internal Medicine

## 2023-01-01 ENCOUNTER — Other Ambulatory Visit: Payer: Self-pay

## 2023-01-01 MED ORDER — BASAGLAR KWIKPEN 100 UNIT/ML ~~LOC~~ SOPN
PEN_INJECTOR | SUBCUTANEOUS | 5 refills | Status: DC
  Filled 2023-01-01 – 2023-01-11 (×2): qty 30, 28d supply, fill #0
  Filled 2023-01-29 – 2023-02-10 (×2): qty 30, 28d supply, fill #1

## 2023-01-02 ENCOUNTER — Other Ambulatory Visit: Payer: Self-pay

## 2023-01-07 ENCOUNTER — Other Ambulatory Visit: Payer: Self-pay

## 2023-01-08 ENCOUNTER — Other Ambulatory Visit: Payer: Self-pay

## 2023-01-11 ENCOUNTER — Other Ambulatory Visit: Payer: Self-pay

## 2023-01-11 ENCOUNTER — Other Ambulatory Visit (HOSPITAL_COMMUNITY): Payer: Self-pay

## 2023-01-11 ENCOUNTER — Other Ambulatory Visit: Payer: Self-pay | Admitting: Internal Medicine

## 2023-01-11 DIAGNOSIS — E785 Hyperlipidemia, unspecified: Secondary | ICD-10-CM

## 2023-01-11 MED ORDER — ROSUVASTATIN CALCIUM 40 MG PO TABS
40.0000 mg | ORAL_TABLET | Freq: Every day | ORAL | 0 refills | Status: DC
  Filled 2023-01-11: qty 90, 90d supply, fill #0

## 2023-01-14 ENCOUNTER — Other Ambulatory Visit (HOSPITAL_COMMUNITY): Payer: Self-pay

## 2023-01-21 ENCOUNTER — Other Ambulatory Visit (HOSPITAL_COMMUNITY): Payer: Self-pay

## 2023-01-21 ENCOUNTER — Other Ambulatory Visit: Payer: Self-pay

## 2023-01-24 ENCOUNTER — Other Ambulatory Visit (HOSPITAL_COMMUNITY): Payer: Self-pay

## 2023-01-24 ENCOUNTER — Other Ambulatory Visit: Payer: Self-pay

## 2023-01-25 ENCOUNTER — Other Ambulatory Visit: Payer: Self-pay

## 2023-01-28 ENCOUNTER — Other Ambulatory Visit (HOSPITAL_COMMUNITY): Payer: Self-pay

## 2023-01-28 MED ORDER — TRESIBA FLEXTOUCH 100 UNIT/ML ~~LOC~~ SOPN
60.0000 [IU] | PEN_INJECTOR | Freq: Two times a day (BID) | SUBCUTANEOUS | 5 refills | Status: DC
  Filled 2023-01-29 – 2023-03-01 (×3): qty 30, 28d supply, fill #0

## 2023-01-29 ENCOUNTER — Other Ambulatory Visit (HOSPITAL_COMMUNITY): Payer: Self-pay

## 2023-01-30 ENCOUNTER — Other Ambulatory Visit (HOSPITAL_COMMUNITY): Payer: Self-pay

## 2023-01-30 ENCOUNTER — Other Ambulatory Visit: Payer: Self-pay

## 2023-02-10 ENCOUNTER — Other Ambulatory Visit (HOSPITAL_COMMUNITY): Payer: Self-pay

## 2023-02-11 ENCOUNTER — Other Ambulatory Visit (HOSPITAL_COMMUNITY): Payer: Self-pay

## 2023-02-11 ENCOUNTER — Other Ambulatory Visit: Payer: Self-pay

## 2023-02-11 MED ORDER — POTASSIUM CHLORIDE ER 10 MEQ PO TBCR
10.0000 meq | EXTENDED_RELEASE_TABLET | Freq: Every day | ORAL | 5 refills | Status: DC
  Filled 2023-02-11: qty 30, 30d supply, fill #0
  Filled 2023-03-06: qty 30, 30d supply, fill #1
  Filled 2023-04-11: qty 30, 30d supply, fill #2
  Filled 2023-05-05: qty 30, 30d supply, fill #3
  Filled 2023-06-05: qty 30, 30d supply, fill #4
  Filled 2023-06-30: qty 30, 30d supply, fill #5

## 2023-02-12 ENCOUNTER — Other Ambulatory Visit (HOSPITAL_COMMUNITY): Payer: Self-pay

## 2023-02-12 ENCOUNTER — Other Ambulatory Visit: Payer: Self-pay

## 2023-02-13 ENCOUNTER — Other Ambulatory Visit (HOSPITAL_COMMUNITY): Payer: Self-pay

## 2023-02-13 ENCOUNTER — Other Ambulatory Visit: Payer: Self-pay

## 2023-02-14 ENCOUNTER — Other Ambulatory Visit: Payer: Self-pay

## 2023-02-15 ENCOUNTER — Other Ambulatory Visit: Payer: Self-pay

## 2023-02-18 ENCOUNTER — Other Ambulatory Visit: Payer: Self-pay

## 2023-02-27 ENCOUNTER — Other Ambulatory Visit: Payer: Self-pay

## 2023-03-01 ENCOUNTER — Other Ambulatory Visit (HOSPITAL_COMMUNITY): Payer: Self-pay

## 2023-03-01 ENCOUNTER — Other Ambulatory Visit: Payer: Self-pay

## 2023-03-01 ENCOUNTER — Ambulatory Visit: Payer: Commercial Managed Care - PPO | Admitting: Internal Medicine

## 2023-03-01 ENCOUNTER — Encounter: Payer: Self-pay | Admitting: Internal Medicine

## 2023-03-01 ENCOUNTER — Encounter (HOSPITAL_COMMUNITY): Payer: Self-pay | Admitting: Pharmacist

## 2023-03-01 VITALS — BP 136/88 | HR 104 | Ht 67.75 in | Wt 275.0 lb

## 2023-03-01 DIAGNOSIS — E11319 Type 2 diabetes mellitus with unspecified diabetic retinopathy without macular edema: Secondary | ICD-10-CM | POA: Diagnosis not present

## 2023-03-01 DIAGNOSIS — Z794 Long term (current) use of insulin: Secondary | ICD-10-CM

## 2023-03-01 DIAGNOSIS — E785 Hyperlipidemia, unspecified: Secondary | ICD-10-CM | POA: Diagnosis not present

## 2023-03-01 DIAGNOSIS — E1142 Type 2 diabetes mellitus with diabetic polyneuropathy: Secondary | ICD-10-CM

## 2023-03-01 DIAGNOSIS — R809 Proteinuria, unspecified: Secondary | ICD-10-CM

## 2023-03-01 DIAGNOSIS — E1129 Type 2 diabetes mellitus with other diabetic kidney complication: Secondary | ICD-10-CM | POA: Diagnosis not present

## 2023-03-01 DIAGNOSIS — E1165 Type 2 diabetes mellitus with hyperglycemia: Secondary | ICD-10-CM | POA: Diagnosis not present

## 2023-03-01 LAB — BASIC METABOLIC PANEL
BUN: 11 mg/dL (ref 6–23)
CO2: 29 mEq/L (ref 19–32)
Calcium: 9.5 mg/dL (ref 8.4–10.5)
Chloride: 102 mEq/L (ref 96–112)
Creatinine, Ser: 0.85 mg/dL (ref 0.40–1.20)
GFR: 83.82 mL/min (ref >60.00)
Glucose, Bld: 170 mg/dL — ABNORMAL HIGH (ref 70–99)
Potassium: 3.8 mEq/L (ref 3.5–5.1)
Sodium: 141 mEq/L (ref 135–145)

## 2023-03-01 LAB — POCT GLYCOSYLATED HEMOGLOBIN (HGB A1C): Hemoglobin A1C: 10.1 % — AB (ref 4.0–5.6)

## 2023-03-01 LAB — LIPID PANEL
Cholesterol: 148 mg/dL (ref 0–200)
HDL: 35.8 mg/dL — ABNORMAL LOW (ref >39.00)
LDL Cholesterol: 79 mg/dL (ref 0–99)
NonHDL: 112.52
Total CHOL/HDL Ratio: 4
Triglycerides: 170 mg/dL — ABNORMAL HIGH (ref 0.0–149.0)
VLDL: 34 mg/dL (ref 0.0–40.0)

## 2023-03-01 LAB — MICROALBUMIN / CREATININE URINE RATIO
Creatinine,U: 203.1 mg/dL
Microalb Creat Ratio: 76.4 mg/g — ABNORMAL HIGH (ref 0.0–30.0)
Microalb, Ur: 155.1 mg/dL — ABNORMAL HIGH (ref 0.0–1.9)

## 2023-03-01 MED ORDER — ROSUVASTATIN CALCIUM 40 MG PO TABS
40.0000 mg | ORAL_TABLET | Freq: Every day | ORAL | 3 refills | Status: DC
  Filled 2023-03-01 – 2023-04-11 (×2): qty 90, 90d supply, fill #0
  Filled 2023-07-07: qty 90, 90d supply, fill #1
  Filled 2023-10-06: qty 90, 90d supply, fill #2
  Filled 2024-01-10: qty 90, 90d supply, fill #3

## 2023-03-01 MED ORDER — TIRZEPATIDE 5 MG/0.5ML ~~LOC~~ SOAJ
5.0000 mg | SUBCUTANEOUS | 3 refills | Status: DC
  Filled 2023-03-01: qty 2, 28d supply, fill #0
  Filled 2023-03-24: qty 2, 28d supply, fill #1
  Filled 2023-04-24: qty 2, 28d supply, fill #2
  Filled 2023-05-27: qty 2, 28d supply, fill #3
  Filled 2023-06-30: qty 2, 28d supply, fill #4

## 2023-03-01 MED ORDER — METFORMIN HCL ER 500 MG PO TB24
500.0000 mg | ORAL_TABLET | Freq: Two times a day (BID) | ORAL | 2 refills | Status: DC
  Filled 2023-03-01 – 2023-04-11 (×2): qty 180, 90d supply, fill #0
  Filled 2023-07-14: qty 180, 90d supply, fill #1

## 2023-03-01 MED ORDER — BASAGLAR KWIKPEN 100 UNIT/ML ~~LOC~~ SOPN
PEN_INJECTOR | SUBCUTANEOUS | 6 refills | Status: DC
  Filled 2023-03-01 (×2): qty 30, 24d supply, fill #0
  Filled 2023-03-06: qty 30, 28d supply, fill #0
  Filled 2023-04-11: qty 30, 28d supply, fill #1
  Filled 2023-06-05: qty 30, 28d supply, fill #2
  Filled 2023-07-07: qty 30, 28d supply, fill #3
  Filled 2023-08-18: qty 30, 28d supply, fill #4

## 2023-03-01 MED ORDER — LOSARTAN POTASSIUM 25 MG PO TABS
25.0000 mg | ORAL_TABLET | Freq: Every day | ORAL | 3 refills | Status: DC
  Filled 2023-03-01: qty 90, 90d supply, fill #0
  Filled 2023-05-27: qty 90, 90d supply, fill #1
  Filled 2023-09-01: qty 90, 90d supply, fill #2
  Filled 2023-11-29: qty 90, 90d supply, fill #3
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
## 2023-03-01 NOTE — Patient Instructions (Addendum)
-   Switch Rybelsus to [place] 5 mg weekly  - Continue Metformin 500 mg XR twice daily  - Change  Basaglar 64 units in the Morning and 60 units at night  - Decrease Humalog 14 units with each meal  -Humalog correctional insulin: ADD extra units on insulin to your meal-time Humalog dose if your blood sugars are higher than 155. Use the scale below to help guide you:   Blood sugar before meal Number of units to inject  Less than 155 0 unit  156 -  180 1 units  181 -  205 2 units  206 -  230 3 units  231 -  255 4 units  256 -  280 5 units  281 -  305 6 units  306 -  330 7 units  331 -  355 8 units  356 - 380 9 units    HOW TO TREAT LOW BLOOD SUGARS (Blood sugar LESS THAN 70 MG/DL) Please follow the RULE OF 15 for the treatment of hypoglycemia treatment (when your (blood sugars are less than 70 mg/dL)   STEP 1: Take 15 grams of carbohydrates when your blood sugar is low, which includes:  3-4 GLUCOSE TABS  OR 3-4 OZ OF JUICE OR REGULAR SODA OR ONE TUBE OF GLUCOSE GEL    STEP 2: RECHECK blood sugar in 15 MINUTES STEP 3: If your blood sugar is still low at the 15 minute recheck --> then, go back to STEP 1 and treat AGAIN with another 15 grams of carbohydrates.

## 2023-03-04 ENCOUNTER — Other Ambulatory Visit: Payer: Self-pay

## 2023-03-06 ENCOUNTER — Other Ambulatory Visit (HOSPITAL_COMMUNITY): Payer: Self-pay

## 2023-03-06 ENCOUNTER — Other Ambulatory Visit: Payer: Self-pay

## 2023-03-06 ENCOUNTER — Other Ambulatory Visit: Payer: Self-pay | Admitting: Internal Medicine

## 2023-03-06 DIAGNOSIS — E785 Hyperlipidemia, unspecified: Secondary | ICD-10-CM

## 2023-03-06 MED ORDER — UNIFINE PENTIPS 32G X 6 MM MISC
1.0000 | Freq: Four times a day (QID) | 3 refills | Status: DC
  Filled 2023-03-06: qty 400, 90d supply, fill #0

## 2023-03-07 ENCOUNTER — Other Ambulatory Visit (HOSPITAL_COMMUNITY): Payer: Self-pay

## 2023-03-09 ENCOUNTER — Other Ambulatory Visit: Payer: Self-pay

## 2023-03-11 ENCOUNTER — Other Ambulatory Visit: Payer: Self-pay

## 2023-03-25 ENCOUNTER — Other Ambulatory Visit: Payer: Self-pay

## 2023-03-25 ENCOUNTER — Other Ambulatory Visit (HOSPITAL_COMMUNITY): Payer: Self-pay

## 2023-03-31 ENCOUNTER — Other Ambulatory Visit (HOSPITAL_COMMUNITY): Payer: Self-pay

## 2023-04-01 ENCOUNTER — Other Ambulatory Visit (HOSPITAL_COMMUNITY): Payer: Self-pay

## 2023-04-01 MED ORDER — PROMETHAZINE HCL 25 MG PO TABS
25.0000 mg | ORAL_TABLET | Freq: Two times a day (BID) | ORAL | 1 refills | Status: DC | PRN
  Filled 2023-04-01: qty 180, 90d supply, fill #0
  Filled 2023-07-14: qty 180, 90d supply, fill #1

## 2023-04-02 ENCOUNTER — Other Ambulatory Visit (HOSPITAL_COMMUNITY): Payer: Self-pay

## 2023-04-02 ENCOUNTER — Other Ambulatory Visit: Payer: Self-pay

## 2023-04-02 MED ORDER — LEVOCETIRIZINE DIHYDROCHLORIDE 5 MG PO TABS
5.0000 mg | ORAL_TABLET | Freq: Every day | ORAL | 2 refills | Status: DC
  Filled 2023-04-02: qty 90, 90d supply, fill #0
  Filled 2023-07-07: qty 90, 90d supply, fill #1
  Filled 2023-10-06: qty 90, 90d supply, fill #2

## 2023-04-04 ENCOUNTER — Other Ambulatory Visit (HOSPITAL_COMMUNITY): Payer: Self-pay

## 2023-04-05 ENCOUNTER — Other Ambulatory Visit (HOSPITAL_COMMUNITY): Payer: Self-pay

## 2023-04-08 ENCOUNTER — Other Ambulatory Visit (HOSPITAL_COMMUNITY): Payer: Self-pay

## 2023-04-11 ENCOUNTER — Other Ambulatory Visit: Payer: Self-pay

## 2023-04-11 ENCOUNTER — Other Ambulatory Visit (HOSPITAL_COMMUNITY): Payer: Self-pay

## 2023-04-11 MED ORDER — ESOMEPRAZOLE MAGNESIUM 40 MG PO CPDR
40.0000 mg | DELAYED_RELEASE_CAPSULE | Freq: Every day | ORAL | 2 refills | Status: DC
  Filled 2023-06-09: qty 90, 90d supply, fill #0
  Filled 2023-09-01: qty 90, 90d supply, fill #1
  Filled 2023-12-02: qty 90, 90d supply, fill #2

## 2023-04-13 ENCOUNTER — Other Ambulatory Visit (HOSPITAL_COMMUNITY): Payer: Self-pay

## 2023-04-15 ENCOUNTER — Other Ambulatory Visit: Payer: Self-pay

## 2023-04-15 ENCOUNTER — Other Ambulatory Visit (HOSPITAL_COMMUNITY): Payer: Self-pay

## 2023-04-17 ENCOUNTER — Other Ambulatory Visit (HOSPITAL_COMMUNITY): Payer: Self-pay

## 2023-04-17 MED ORDER — TIZANIDINE HCL 4 MG PO TABS
4.0000 mg | ORAL_TABLET | Freq: Two times a day (BID) | ORAL | 1 refills | Status: DC | PRN
  Filled 2023-04-17 – 2023-06-27 (×3): qty 180, 90d supply, fill #0
  Filled 2023-09-22: qty 180, 90d supply, fill #1

## 2023-04-24 ENCOUNTER — Other Ambulatory Visit: Payer: Self-pay

## 2023-04-24 ENCOUNTER — Other Ambulatory Visit (HOSPITAL_COMMUNITY): Payer: Self-pay

## 2023-04-25 ENCOUNTER — Other Ambulatory Visit: Payer: Self-pay

## 2023-04-30 ENCOUNTER — Other Ambulatory Visit: Payer: Self-pay

## 2023-05-05 ENCOUNTER — Other Ambulatory Visit (HOSPITAL_COMMUNITY): Payer: Self-pay

## 2023-05-07 ENCOUNTER — Other Ambulatory Visit: Payer: Self-pay

## 2023-05-07 ENCOUNTER — Other Ambulatory Visit (HOSPITAL_COMMUNITY): Payer: Self-pay

## 2023-05-07 MED ORDER — SERTRALINE HCL 50 MG PO TABS
50.0000 mg | ORAL_TABLET | Freq: Every evening | ORAL | 5 refills | Status: DC
  Filled 2023-05-07: qty 30, 30d supply, fill #0
  Filled 2023-06-05: qty 30, 30d supply, fill #1
  Filled 2023-06-30: qty 30, 30d supply, fill #2
  Filled 2023-08-04: qty 30, 30d supply, fill #3
  Filled 2023-09-01: qty 30, 30d supply, fill #4
  Filled 2023-09-29: qty 30, 30d supply, fill #5

## 2023-05-20 DIAGNOSIS — N898 Other specified noninflammatory disorders of vagina: Secondary | ICD-10-CM | POA: Diagnosis not present

## 2023-05-20 DIAGNOSIS — N9489 Other specified conditions associated with female genital organs and menstrual cycle: Secondary | ICD-10-CM | POA: Diagnosis not present

## 2023-05-20 DIAGNOSIS — Z01419 Encounter for gynecological examination (general) (routine) without abnormal findings: Secondary | ICD-10-CM | POA: Diagnosis not present

## 2023-05-27 ENCOUNTER — Other Ambulatory Visit (HOSPITAL_COMMUNITY): Payer: Self-pay

## 2023-05-27 ENCOUNTER — Other Ambulatory Visit: Payer: Self-pay

## 2023-05-27 MED ORDER — FLUTICASONE PROPIONATE 50 MCG/ACT NA SUSP
2.0000 | Freq: Every day | NASAL | 5 refills | Status: DC
  Filled 2023-05-27 – 2023-05-28 (×2): qty 16, 30d supply, fill #0
  Filled 2023-10-17: qty 16, 30d supply, fill #1
  Filled 2024-04-20: qty 16, 30d supply, fill #2

## 2023-05-28 ENCOUNTER — Other Ambulatory Visit (HOSPITAL_COMMUNITY): Payer: Self-pay

## 2023-06-06 ENCOUNTER — Other Ambulatory Visit: Payer: Self-pay

## 2023-06-06 ENCOUNTER — Other Ambulatory Visit (HOSPITAL_COMMUNITY): Payer: Self-pay

## 2023-06-10 ENCOUNTER — Other Ambulatory Visit: Payer: Self-pay

## 2023-06-11 ENCOUNTER — Other Ambulatory Visit (HOSPITAL_COMMUNITY): Payer: Self-pay

## 2023-06-17 ENCOUNTER — Other Ambulatory Visit (HOSPITAL_COMMUNITY): Payer: Self-pay

## 2023-06-18 ENCOUNTER — Other Ambulatory Visit (HOSPITAL_COMMUNITY): Payer: Self-pay

## 2023-06-27 ENCOUNTER — Other Ambulatory Visit: Payer: Self-pay

## 2023-06-27 ENCOUNTER — Other Ambulatory Visit (HOSPITAL_COMMUNITY): Payer: Self-pay

## 2023-06-27 MED ORDER — GABAPENTIN 600 MG PO TABS
600.0000 mg | ORAL_TABLET | Freq: Three times a day (TID) | ORAL | 5 refills | Status: DC
  Filled 2023-06-27: qty 90, 30d supply, fill #0
  Filled 2023-07-24: qty 90, 30d supply, fill #1
  Filled 2023-08-22: qty 90, 30d supply, fill #2
  Filled 2023-09-22: qty 90, 30d supply, fill #3
  Filled 2023-11-04: qty 90, 30d supply, fill #4
  Filled 2023-11-29: qty 90, 30d supply, fill #5

## 2023-06-28 ENCOUNTER — Other Ambulatory Visit: Payer: Self-pay

## 2023-06-30 ENCOUNTER — Other Ambulatory Visit (HOSPITAL_COMMUNITY): Payer: Self-pay

## 2023-07-01 ENCOUNTER — Other Ambulatory Visit: Payer: Self-pay

## 2023-07-01 ENCOUNTER — Other Ambulatory Visit (HOSPITAL_COMMUNITY): Payer: Self-pay

## 2023-07-01 MED ORDER — ESTROGENS CONJUGATED 0.625 MG PO TABS
0.6250 mg | ORAL_TABLET | Freq: Every day | ORAL | 5 refills | Status: DC
  Filled 2023-07-01: qty 30, 30d supply, fill #0
  Filled 2023-08-18 – 2023-09-09 (×2): qty 30, 30d supply, fill #1
  Filled 2023-10-17: qty 30, 30d supply, fill #2

## 2023-07-08 ENCOUNTER — Other Ambulatory Visit: Payer: Self-pay

## 2023-07-08 ENCOUNTER — Other Ambulatory Visit (HOSPITAL_COMMUNITY): Payer: Self-pay

## 2023-07-14 ENCOUNTER — Other Ambulatory Visit (HOSPITAL_COMMUNITY): Payer: Self-pay

## 2023-07-15 ENCOUNTER — Other Ambulatory Visit: Payer: Self-pay

## 2023-07-15 ENCOUNTER — Other Ambulatory Visit (HOSPITAL_COMMUNITY): Payer: Self-pay

## 2023-07-15 MED ORDER — ZOLPIDEM TARTRATE ER 12.5 MG PO TBCR
12.5000 mg | EXTENDED_RELEASE_TABLET | Freq: Every evening | ORAL | 2 refills | Status: DC | PRN
  Filled 2023-07-15 – 2023-07-23 (×2): qty 90, 90d supply, fill #0
  Filled 2023-10-17 – 2023-10-19 (×3): qty 90, 90d supply, fill #1

## 2023-07-19 ENCOUNTER — Other Ambulatory Visit (HOSPITAL_COMMUNITY): Payer: Self-pay

## 2023-07-20 ENCOUNTER — Other Ambulatory Visit (HOSPITAL_COMMUNITY): Payer: Self-pay

## 2023-07-21 ENCOUNTER — Other Ambulatory Visit (HOSPITAL_COMMUNITY): Payer: Self-pay

## 2023-07-22 MED ORDER — HYDROXYZINE HCL 25 MG PO TABS
25.0000 mg | ORAL_TABLET | Freq: Four times a day (QID) | ORAL | 0 refills | Status: DC | PRN
  Filled 2023-07-22: qty 60, 15d supply, fill #0

## 2023-07-23 ENCOUNTER — Other Ambulatory Visit (HOSPITAL_COMMUNITY): Payer: Self-pay

## 2023-07-23 ENCOUNTER — Other Ambulatory Visit: Payer: Self-pay

## 2023-07-24 ENCOUNTER — Other Ambulatory Visit (HOSPITAL_COMMUNITY): Payer: Self-pay

## 2023-07-24 ENCOUNTER — Other Ambulatory Visit: Payer: Self-pay | Admitting: Internal Medicine

## 2023-07-24 MED ORDER — FREESTYLE LIBRE 3 SENSOR MISC
1.0000 | 3 refills | Status: DC
  Filled 2023-07-24: qty 2, 28d supply, fill #0
  Filled 2023-09-09: qty 2, 28d supply, fill #1
  Filled 2023-11-29: qty 2, 28d supply, fill #2
  Filled 2024-02-13: qty 2, 28d supply, fill #3

## 2023-07-25 ENCOUNTER — Other Ambulatory Visit: Payer: Self-pay

## 2023-07-25 ENCOUNTER — Other Ambulatory Visit (HOSPITAL_COMMUNITY): Payer: Self-pay

## 2023-07-26 MED ORDER — FREESTYLE LANCETS MISC
0 refills | Status: AC
  Filled 2023-07-26 – 2023-08-02 (×2): qty 300, 90d supply, fill #0

## 2023-07-27 ENCOUNTER — Other Ambulatory Visit (HOSPITAL_COMMUNITY): Payer: Self-pay

## 2023-07-29 ENCOUNTER — Other Ambulatory Visit (HOSPITAL_COMMUNITY): Payer: Self-pay

## 2023-07-30 ENCOUNTER — Other Ambulatory Visit: Payer: Self-pay

## 2023-07-31 DIAGNOSIS — H524 Presbyopia: Secondary | ICD-10-CM | POA: Diagnosis not present

## 2023-07-31 DIAGNOSIS — H5213 Myopia, bilateral: Secondary | ICD-10-CM | POA: Diagnosis not present

## 2023-08-01 ENCOUNTER — Other Ambulatory Visit (HOSPITAL_COMMUNITY): Payer: Self-pay

## 2023-08-02 ENCOUNTER — Encounter (HOSPITAL_COMMUNITY): Payer: Self-pay | Admitting: Pharmacist

## 2023-08-02 ENCOUNTER — Other Ambulatory Visit (HOSPITAL_COMMUNITY): Payer: Self-pay

## 2023-08-04 ENCOUNTER — Other Ambulatory Visit (HOSPITAL_COMMUNITY): Payer: Self-pay

## 2023-08-05 ENCOUNTER — Other Ambulatory Visit: Payer: Self-pay

## 2023-08-06 ENCOUNTER — Other Ambulatory Visit: Payer: Self-pay

## 2023-08-07 ENCOUNTER — Other Ambulatory Visit: Payer: Self-pay

## 2023-08-08 ENCOUNTER — Other Ambulatory Visit: Payer: Self-pay

## 2023-08-18 ENCOUNTER — Other Ambulatory Visit (HOSPITAL_COMMUNITY): Payer: Self-pay

## 2023-08-19 ENCOUNTER — Other Ambulatory Visit: Payer: Self-pay

## 2023-08-20 ENCOUNTER — Other Ambulatory Visit: Payer: Self-pay

## 2023-08-21 ENCOUNTER — Other Ambulatory Visit: Payer: Self-pay

## 2023-08-21 ENCOUNTER — Other Ambulatory Visit (HOSPITAL_COMMUNITY): Payer: Self-pay

## 2023-08-22 ENCOUNTER — Other Ambulatory Visit: Payer: Self-pay

## 2023-08-22 ENCOUNTER — Other Ambulatory Visit (HOSPITAL_COMMUNITY): Payer: Self-pay

## 2023-09-01 ENCOUNTER — Other Ambulatory Visit (HOSPITAL_COMMUNITY): Payer: Self-pay

## 2023-09-02 ENCOUNTER — Other Ambulatory Visit (HOSPITAL_COMMUNITY): Payer: Self-pay

## 2023-09-10 ENCOUNTER — Other Ambulatory Visit: Payer: Self-pay

## 2023-09-13 ENCOUNTER — Encounter: Payer: Self-pay | Admitting: Internal Medicine

## 2023-09-13 ENCOUNTER — Other Ambulatory Visit (INDEPENDENT_AMBULATORY_CARE_PROVIDER_SITE_OTHER): Payer: Commercial Managed Care - PPO

## 2023-09-13 ENCOUNTER — Other Ambulatory Visit: Payer: Self-pay

## 2023-09-13 ENCOUNTER — Ambulatory Visit (INDEPENDENT_AMBULATORY_CARE_PROVIDER_SITE_OTHER): Payer: Commercial Managed Care - PPO | Admitting: Internal Medicine

## 2023-09-13 ENCOUNTER — Other Ambulatory Visit (HOSPITAL_COMMUNITY): Payer: Self-pay

## 2023-09-13 VITALS — BP 126/78 | HR 100 | Ht 67.75 in | Wt 280.0 lb

## 2023-09-13 DIAGNOSIS — E1165 Type 2 diabetes mellitus with hyperglycemia: Secondary | ICD-10-CM

## 2023-09-13 DIAGNOSIS — E1129 Type 2 diabetes mellitus with other diabetic kidney complication: Secondary | ICD-10-CM | POA: Diagnosis not present

## 2023-09-13 DIAGNOSIS — E1142 Type 2 diabetes mellitus with diabetic polyneuropathy: Secondary | ICD-10-CM

## 2023-09-13 DIAGNOSIS — E11319 Type 2 diabetes mellitus with unspecified diabetic retinopathy without macular edema: Secondary | ICD-10-CM | POA: Diagnosis not present

## 2023-09-13 DIAGNOSIS — Z794 Long term (current) use of insulin: Secondary | ICD-10-CM

## 2023-09-13 DIAGNOSIS — R809 Proteinuria, unspecified: Secondary | ICD-10-CM

## 2023-09-13 LAB — BASIC METABOLIC PANEL
BUN: 14 mg/dL (ref 6–23)
CO2: 27 meq/L (ref 19–32)
Calcium: 9.8 mg/dL (ref 8.4–10.5)
Chloride: 103 meq/L (ref 96–112)
Creatinine, Ser: 0.81 mg/dL (ref 0.40–1.20)
GFR: 88.48 mL/min (ref >60.00)
Glucose, Bld: 145 mg/dL — ABNORMAL HIGH (ref 70–99)
Potassium: 3.5 meq/L (ref 3.5–5.1)
Sodium: 142 meq/L (ref 135–145)

## 2023-09-13 LAB — POCT GLYCOSYLATED HEMOGLOBIN (HGB A1C): Hemoglobin A1C: 9.9 % — AB (ref 4.0–5.6)

## 2023-09-13 LAB — MICROALBUMIN / CREATININE URINE RATIO
Creatinine,U: 174.1 mg/dL
Microalb Creat Ratio: 34 mg/g — ABNORMAL HIGH (ref 0.0–30.0)
Microalb, Ur: 59.1 mg/dL — ABNORMAL HIGH (ref 0.0–1.9)

## 2023-09-13 LAB — POCT GLUCOSE (DEVICE FOR HOME USE): Glucose Fasting, POC: 157 mg/dL — AB (ref 70–99)

## 2023-09-13 MED ORDER — METFORMIN HCL ER 500 MG PO TB24
500.0000 mg | ORAL_TABLET | Freq: Two times a day (BID) | ORAL | 2 refills | Status: DC
  Filled 2023-09-13 – 2023-10-17 (×2): qty 180, 90d supply, fill #0
  Filled 2024-01-10: qty 180, 90d supply, fill #1

## 2023-09-13 MED ORDER — HUMALOG KWIKPEN 200 UNIT/ML ~~LOC~~ SOPN
PEN_INJECTOR | SUBCUTANEOUS | 3 refills | Status: DC
  Filled 2023-09-13: qty 30, fill #0
  Filled 2023-10-17: qty 30, 75d supply, fill #0

## 2023-09-13 MED ORDER — BASAGLAR KWIKPEN 100 UNIT/ML ~~LOC~~ SOPN
60.0000 [IU] | PEN_INJECTOR | Freq: Two times a day (BID) | SUBCUTANEOUS | 3 refills | Status: DC
Start: 2023-09-13 — End: 2024-01-22
  Filled 2023-09-13: qty 30, 28d supply, fill #0
  Filled 2024-01-17: qty 30, 28d supply, fill #1

## 2023-09-13 MED ORDER — TIRZEPATIDE 7.5 MG/0.5ML ~~LOC~~ SOAJ
7.5000 mg | SUBCUTANEOUS | 3 refills | Status: DC
  Filled 2023-09-13: qty 2, 28d supply, fill #0
  Filled 2023-09-16 – 2023-10-17 (×2): qty 2, 28d supply, fill #1
  Filled 2024-01-10: qty 2, 28d supply, fill #2
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
## 2023-09-13 NOTE — Patient Instructions (Signed)
-   Increase  Mounjaro 7.5 mg weekly  - Continue Metformin 500 mg XR twice daily  - Change  Basaglar 60 units in the Morning and 60 units at night  - Take  Humalog 14 units with each meal  - Take 6 units with  -Humalog correctional insulin: ADD extra units on insulin to your meal-time Humalog dose if your blood sugars are higher than 155. Use the scale below to help guide you:   Blood sugar before meal Number of units to inject  Less than 155 0 unit  156 -  180 1 units  181 -  205 2 units  206 -  230 3 units  231 -  255 4 units  256 -  280 5 units  281 -  305 6 units  306 -  330 7 units  331 -  355 8 units  356 - 380 9 units    HOW TO TREAT LOW BLOOD SUGARS (Blood sugar LESS THAN 70 MG/DL) Please follow the RULE OF 15 for the treatment of hypoglycemia treatment (when your (blood sugars are less than 70 mg/dL)   STEP 1: Take 15 grams of carbohydrates when your blood sugar is low, which includes:  3-4 GLUCOSE TABS  OR 3-4 OZ OF JUICE OR REGULAR SODA OR ONE TUBE OF GLUCOSE GEL    STEP 2: RECHECK blood sugar in 15 MINUTES STEP 3: If your blood sugar is still low at the 15 minute recheck --> then, go back to STEP 1 and treat AGAIN with another 15 grams of carbohydrates.

## 2023-09-16 ENCOUNTER — Other Ambulatory Visit: Payer: Self-pay

## 2023-09-16 ENCOUNTER — Other Ambulatory Visit (HOSPITAL_COMMUNITY): Payer: Self-pay

## 2023-09-16 MED ORDER — HYDROXYZINE HCL 25 MG PO TABS
25.0000 mg | ORAL_TABLET | Freq: Four times a day (QID) | ORAL | 2 refills | Status: DC | PRN
  Filled 2023-09-16: qty 60, 15d supply, fill #0
  Filled 2023-11-04: qty 60, 15d supply, fill #1
  Filled 2023-11-29: qty 60, 15d supply, fill #2

## 2023-09-23 ENCOUNTER — Other Ambulatory Visit: Payer: Self-pay

## 2023-09-24 ENCOUNTER — Other Ambulatory Visit (HOSPITAL_COMMUNITY): Payer: Self-pay

## 2023-09-24 ENCOUNTER — Other Ambulatory Visit: Payer: Self-pay

## 2023-09-24 DIAGNOSIS — Z0000 Encounter for general adult medical examination without abnormal findings: Secondary | ICD-10-CM | POA: Diagnosis not present

## 2023-09-24 DIAGNOSIS — E0842 Diabetes mellitus due to underlying condition with diabetic polyneuropathy: Secondary | ICD-10-CM | POA: Diagnosis not present

## 2023-09-24 DIAGNOSIS — E559 Vitamin D deficiency, unspecified: Secondary | ICD-10-CM | POA: Diagnosis not present

## 2023-09-24 DIAGNOSIS — E7849 Other hyperlipidemia: Secondary | ICD-10-CM | POA: Diagnosis not present

## 2023-09-24 MED ORDER — TRELEGY ELLIPTA 200-62.5-25 MCG/ACT IN AEPB
1.0000 | INHALATION_SPRAY | Freq: Every day | RESPIRATORY_TRACT | 5 refills | Status: DC
  Filled 2023-09-24: qty 60, 30d supply, fill #0

## 2023-09-24 MED ORDER — ALBUTEROL SULFATE HFA 108 (90 BASE) MCG/ACT IN AERS
2.0000 | INHALATION_SPRAY | Freq: Four times a day (QID) | RESPIRATORY_TRACT | 5 refills | Status: AC | PRN
  Filled 2023-09-24: qty 20.1, 75d supply, fill #0
  Filled 2023-09-26: qty 20.1, 51d supply, fill #0

## 2023-09-24 MED ORDER — NOREL AD 4-10-325 MG PO TABS
1.0000 | ORAL_TABLET | Freq: Two times a day (BID) | ORAL | 2 refills | Status: DC | PRN
  Filled 2023-09-24 – 2023-09-26 (×2): qty 20, 10d supply, fill #0

## 2023-09-25 ENCOUNTER — Other Ambulatory Visit (HOSPITAL_COMMUNITY): Payer: Self-pay

## 2023-09-25 ENCOUNTER — Other Ambulatory Visit: Payer: Self-pay

## 2023-09-26 ENCOUNTER — Other Ambulatory Visit: Payer: Self-pay

## 2023-09-26 ENCOUNTER — Other Ambulatory Visit (HOSPITAL_COMMUNITY): Payer: Self-pay

## 2023-09-27 ENCOUNTER — Other Ambulatory Visit (HOSPITAL_COMMUNITY): Payer: Self-pay

## 2023-09-29 ENCOUNTER — Other Ambulatory Visit (HOSPITAL_COMMUNITY): Payer: Self-pay

## 2023-09-30 ENCOUNTER — Other Ambulatory Visit: Payer: Self-pay

## 2023-10-07 ENCOUNTER — Other Ambulatory Visit (HOSPITAL_COMMUNITY): Payer: Self-pay

## 2023-10-08 DIAGNOSIS — E1142 Type 2 diabetes mellitus with diabetic polyneuropathy: Secondary | ICD-10-CM | POA: Diagnosis not present

## 2023-10-08 DIAGNOSIS — E7849 Other hyperlipidemia: Secondary | ICD-10-CM | POA: Diagnosis not present

## 2023-10-08 DIAGNOSIS — J4520 Mild intermittent asthma, uncomplicated: Secondary | ICD-10-CM | POA: Diagnosis not present

## 2023-10-08 DIAGNOSIS — J302 Other seasonal allergic rhinitis: Secondary | ICD-10-CM | POA: Diagnosis not present

## 2023-10-17 ENCOUNTER — Other Ambulatory Visit (HOSPITAL_BASED_OUTPATIENT_CLINIC_OR_DEPARTMENT_OTHER): Payer: Self-pay

## 2023-10-17 ENCOUNTER — Emergency Department (HOSPITAL_COMMUNITY): Payer: Commercial Managed Care - PPO

## 2023-10-17 ENCOUNTER — Other Ambulatory Visit (HOSPITAL_COMMUNITY): Payer: Self-pay

## 2023-10-17 ENCOUNTER — Encounter: Payer: Self-pay | Admitting: Pharmacist

## 2023-10-17 ENCOUNTER — Emergency Department (HOSPITAL_COMMUNITY)
Admission: EM | Admit: 2023-10-17 | Discharge: 2023-10-17 | Disposition: A | Discharge status: Home / Self Care | Payer: Commercial Managed Care - PPO | Attending: Emergency Medicine | Admitting: Emergency Medicine

## 2023-10-17 ENCOUNTER — Other Ambulatory Visit: Payer: Self-pay

## 2023-10-17 DIAGNOSIS — M7989 Other specified soft tissue disorders: Secondary | ICD-10-CM | POA: Diagnosis not present

## 2023-10-17 DIAGNOSIS — M19042 Primary osteoarthritis, left hand: Secondary | ICD-10-CM | POA: Diagnosis not present

## 2023-10-17 DIAGNOSIS — S59902A Unspecified injury of left elbow, initial encounter: Secondary | ICD-10-CM | POA: Diagnosis not present

## 2023-10-17 DIAGNOSIS — M19032 Primary osteoarthritis, left wrist: Secondary | ICD-10-CM | POA: Diagnosis not present

## 2023-10-17 DIAGNOSIS — S4992XA Unspecified injury of left shoulder and upper arm, initial encounter: Secondary | ICD-10-CM | POA: Diagnosis not present

## 2023-10-17 DIAGNOSIS — Y9302 Activity, running: Secondary | ICD-10-CM | POA: Diagnosis not present

## 2023-10-17 DIAGNOSIS — Z794 Long term (current) use of insulin: Secondary | ICD-10-CM | POA: Insufficient documentation

## 2023-10-17 DIAGNOSIS — S62647A Nondisplaced fracture of proximal phalanx of left little finger, initial encounter for closed fracture: Secondary | ICD-10-CM | POA: Diagnosis not present

## 2023-10-17 DIAGNOSIS — S6992XA Unspecified injury of left wrist, hand and finger(s), initial encounter: Secondary | ICD-10-CM | POA: Diagnosis present

## 2023-10-17 DIAGNOSIS — W108XXA Fall (on) (from) other stairs and steps, initial encounter: Secondary | ICD-10-CM | POA: Diagnosis not present

## 2023-10-17 DIAGNOSIS — S62617A Displaced fracture of proximal phalanx of left little finger, initial encounter for closed fracture: Secondary | ICD-10-CM | POA: Diagnosis not present

## 2023-10-17 MED ORDER — OXYCODONE-ACETAMINOPHEN 5-325 MG PO TABS: 1.0000 | ORAL_TABLET | Freq: Four times a day (QID) | ORAL | 0 refills | Status: AC | PRN

## 2023-10-17 MED ORDER — LIDOCAINE HCL (PF) 1 % IJ SOLN
5.0000 mL | Freq: Once | INTRAMUSCULAR | Status: AC
  Administered 2023-10-17: 5 mL
  Filled 2023-10-17: qty 5

## 2023-10-17 MED ORDER — OXYCODONE-ACETAMINOPHEN 5-325 MG PO TABS
2.0000 | ORAL_TABLET | Freq: Once | ORAL | Status: AC
  Administered 2023-10-17: 2 via ORAL
  Filled 2023-10-17: qty 2

## 2023-10-17 NOTE — ED Provider Notes (Signed)
[place] EMERGENCY DEPARTMENT AT [facility] Provider Note   CSN: [id number] Arrival date & time: 10/17/23  1633     History  Chief Complaint  Patient presents with   Fall    Finger injury    Sharon Gregory is a 42 y.o. female presents to the ED after a fall down 1 step after running from a flying insect.  Patient fell and landed on outstretched hands and her knees.  Did not hit head or lose consciousness.  Does not take blood thinners.  Noted immediate pain to her left little finger, left lateral hand, left wrist forearm and elbow.  No headache or other body pain. Mild numbness to tip of L little finger.   The history is provided by the patient and medical records.       Home Medications Prior to Admission medications   Medication Sig Start Date End Date Taking? Authorizing Provider  oxyCODONE-acetaminophen (PERCOCET/ROXICET) 5-325 MG tablet Take 1 tablet by mouth every 6 (six) hours as needed for up to 5 days for severe pain (pain score 7-10). 10/17/23 10/22/23 Yes Karmen Stabs, MD  albuterol (VENTOLIN HFA) 108 (90 Base) MCG/ACT inhaler Inhale 2 puffs into the lungs 4 (four) times daily as needed. 09/24/23     Blood Glucose Monitoring Suppl (FREESTYLE LITE) w/Device KIT Use as directed to test glucose levels three times daily. 10/16/21     Boric Acid POWD as directed Vaginally Once a week 05/24/23   [provider]  Chlorphen-PE-Acetaminophen (NOREL AD) 4-10-325 MG TABS Take 1 tablet by mouth 2 (two) times daily as needed for cough and congestion. 11/29/22     Chlorphen-PE-Acetaminophen (NOREL AD) 4-10-325 MG TABS Take 1 tablet by mouth 2 (two) times daily as needed for cough and congestion. 09/24/23     Continuous Glucose Sensor (FREESTYLE LIBRE 3 SENSOR) MISC Place 1 sensor on the skin every 14 days. Use to check glucose continuously 07/24/23   Shamleffer, Konrad Dolores, MD  esomeprazole (NEXIUM) 40 MG capsule Take 1 capsule (40 mg total) by mouth daily.  04/15/23     estrogens, conjugated, (PREMARIN) 0.625 MG tablet Take 1 tablet (0.625 mg total) by mouth daily. 07/01/23     Fluocinolone Acetonide 0.01 % OIL Apply topically in affected ear(s) twice daily as needed. 07/05/21     fluticasone (FLONASE) 50 MCG/ACT nasal spray Place 2 sprays into both nostrils daily. 05/28/23     Fluticasone-Umeclidin-Vilant (TRELEGY ELLIPTA) 200-62.5-25 MCG/ACT AEPB Inhale 1 puff into the lungs daily. 09/24/23     gabapentin (NEURONTIN) 600 MG tablet Take 1 tablet (600 mg total) by mouth 3 (three) times daily. 06/27/23     glucose blood (FREESTYLE LITE) test strip Use as directed to test blood glucose levels three times daily. 10/16/21     hydrOXYzine (ATARAX) 25 MG tablet Take 1 tablet (25 mg total) by mouth every 6 (six) hours as needed for itching. 09/16/23     Insulin Glargine (BASAGLAR KWIKPEN) 100 UNIT/ML Inject 60 Units into the skin 2 (two) times daily. 09/13/23   Shamleffer, Konrad Dolores, MD  insulin lispro (HUMALOG KWIKPEN) 200 UNIT/ML KwikPen Inject as directed (Max 80 units daily per scale) 09/13/23   Shamleffer, Konrad Dolores, MD  Insulin Pen Needle (UNIFINE PENTIPS) 32G X 6 MM MISC Use in the morning, at noon, in the evening, and at bedtime as directed 03/06/23   Shamleffer, Konrad Dolores, MD  Lancets (FREESTYLE) lancets use for blood glucose testing three times daily 07/27/23  levocetirizine (XYZAL) 5 MG tablet Take 1 tablet (5 mg total) by mouth daily. 04/01/23   Fleet Contras, MD  losartan (COZAAR) 25 MG tablet Take 1 tablet (25 mg total) by mouth daily. 03/01/23   Shamleffer, Konrad Dolores, MD  meloxicam (MOBIC) 15 MG tablet Take 1 tablet (15 mg total) by mouth daily with food 11/22/21     metFORMIN (GLUCOPHAGE-XR) 500 MG 24 hr tablet Take 1 tablet (500 mg total) by mouth 2 (two) times daily with a meal. 09/13/23   Shamleffer, Konrad Dolores, MD  potassium chloride (KLOR-CON) 10 MEQ tablet Take 1 tablet (10 mEq total) by mouth daily. 02/12/23      promethazine (PHENERGAN) 25 MG tablet Take 1 tablet (25 mg total) by mouth 2 (two) times daily as needed. 04/01/23     rosuvastatin (CRESTOR) 40 MG tablet Take 1 tablet (40 mg total) by mouth daily. 03/01/23   Shamleffer, Konrad Dolores, MD  sertraline (ZOLOFT) 50 MG tablet Take 1 tablet (50 mg total) by mouth every evening. 05/07/23     tirzepatide (MOUNJARO) 7.5 MG/0.5ML Pen Inject 7.5 mg into the skin once a week. 09/13/23   Shamleffer, Konrad Dolores, MD  tiZANidine (ZANAFLEX) 4 MG tablet Take 1 tablet (4 mg total) by mouth 2 (two) times daily as needed. 04/17/23     tobramycin-dexamethasone (TOBRADEX) ophthalmic solution Shake liquid and place 2 drops in affected eye(s) every 6 hours as needed. 11/14/22     zolpidem (AMBIEN CR) 12.5 MG CR tablet Take 1 (one) tablet by mouth at bedtime as needed. 07/15/23         Allergies    Kiwi extract and Molds & smuts    Review of Systems   Review of Systems per HPI above  Physical Exam Updated Vital Signs BP (!) 177/92 (BP Location: Right Arm)   Pulse 92   Temp 98.1 F (36.7 C) (Oral)   Resp 18   Ht 5\' 5"  (1.651 m)   Wt 122.9 kg   SpO2 96%   BMI 45.10 kg/m  Physical Exam Constitutional:      Appearance: Normal appearance.  HENT:     Head: Normocephalic and atraumatic.     Nose: Nose normal.     Mouth/Throat:     Mouth: Mucous membranes are moist.  Eyes:     Extraocular Movements: Extraocular movements intact.     Pupils: Pupils are equal, round, and reactive to light.  Cardiovascular:     Rate and Rhythm: Normal rate and regular rhythm.     Pulses: Normal pulses.     Heart sounds: Normal heart sounds.  Pulmonary:     Effort: Pulmonary effort is normal.     Breath sounds: Normal breath sounds.  Abdominal:     General: There is no distension.     Palpations: Abdomen is soft.     Tenderness: There is no abdominal tenderness. There is no guarding.  Musculoskeletal:        General: No deformity or signs of injury.     Cervical back:  Normal range of motion and neck supple. No tenderness.     Right lower leg: No edema.     Left lower leg: No edema.     Comments: Left little finger with bruising and swelling about the base and MCP joint.  Able to flex and extend all digits of the left hand without deficit.  Intact radial pulse.  Normal sensation of the left little finger.  Normal sensation and motor function  of radial, median, and ulnar nerves in the hand.  No traumatic findings to the wrist forearm elbow of left upper extremity or any of the rest of her body.   Neurological:     General: No focal deficit present.     Mental Status: She is alert.     Cranial Nerves: No cranial nerve deficit.     Sensory: No sensory deficit.     Motor: No weakness.     Coordination: Coordination normal.     ED Results / Procedures / Treatments   Labs (all labs ordered are listed, but only abnormal results are displayed) Labs Reviewed - No data to display  EKG None  Radiology DG Elbow 2 Views Left  Result Date: 10/17/2023 CLINICAL DATA:  Fall, left elbow injury EXAM: LEFT ELBOW - 2 VIEW COMPARISON:  None Available. FINDINGS: Two view radiograph of the left elbow is limited by suboptimal flexion on lateral examination. Despite this, there is normal alignment. No acute fracture or dislocation. Joint spaces are preserved. No definite effusion. Soft tissues are unremarkable. IMPRESSION: 1. No acute fracture or dislocation. Electronically Signed   By: Helyn Numbers M.D.   On: 10/17/2023 21:19   DG Finger Little Left  Result Date: 10/17/2023 CLINICAL DATA:  Fall, left fifth digit injury EXAM: LEFT FINGER(S) - 2+ VIEW COMPARISON:  None Available. FINDINGS: Three view radiograph of the left fifth digit is performed within an external immobilizer. There is an acute extra-articular transverse fracture of the base of the fifth proximal phalanx with mild dorsal angulation of the distal fracture fragment. Joint spaces are preserved. No dislocation.  No other fracture identified. IMPRESSION: 1. Acute extra-articular fracture of the base of the fifth proximal phalanx. Electronically Signed   By: Helyn Numbers M.D.   On: 10/17/2023 21:17   DG Forearm Left  Result Date: 10/17/2023 CLINICAL DATA:  Fall, left arm injury EXAM: LEFT FOREARM - 2 VIEW COMPARISON:  None Available. FINDINGS: There is no evidence of fracture or other focal bone lesions. Soft tissues are unremarkable. IMPRESSION: Negative. Electronically Signed   By: Helyn Numbers M.D.   On: 10/17/2023 21:16   DG Hand Complete Left  Result Date: 10/17/2023 CLINICAL DATA:  Left hand and wrist pain after fall down stairs. EXAM: LEFT WRIST - COMPLETE 3+ VIEW; LEFT HAND - COMPLETE 3+ VIEW COMPARISON:  None Available. FINDINGS: Hand: Minimally displaced fracture of the fifth proximal phalanx, mild apex volar angulation. There is no intra-articular involvement. No additional fracture of the hand. The alignment and joint spaces are preserved. Soft tissue edema is seen at the fracture site. Wrist: No additional fracture of the wrist. Mild degenerative change of the distal radioulnar joint. Carpal bones are intact. Slight soft tissue edema. IMPRESSION: 1. Minimally displaced fracture of the fifth proximal phalanx with mild apex volar angulation. 2. No additional fracture of the wrist. Electronically Signed   By: Narda Rutherford M.D.   On: 10/17/2023 18:29   DG Wrist Complete Left  Result Date: 10/17/2023 CLINICAL DATA:  Left hand and wrist pain after fall down stairs. EXAM: LEFT WRIST - COMPLETE 3+ VIEW; LEFT HAND - COMPLETE 3+ VIEW COMPARISON:  None Available. FINDINGS: Hand: Minimally displaced fracture of the fifth proximal phalanx, mild apex volar angulation. There is no intra-articular involvement. No additional fracture of the hand. The alignment and joint spaces are preserved. Soft tissue edema is seen at the fracture site. Wrist: No additional fracture of the wrist. Mild degenerative change of  the distal  radioulnar joint. Carpal bones are intact. Slight soft tissue edema. IMPRESSION: 1. Minimally displaced fracture of the fifth proximal phalanx with mild apex volar angulation. 2. No additional fracture of the wrist. Electronically Signed   By: Narda Rutherford M.D.   On: 10/17/2023 18:29    Procedures .Ortho Injury Treatment  Date/Time: 10/17/2023 7:39 PM  Performed by: Karmen Stabs, MD Authorized by: Heide Scales, MD   Consent:    Consent obtained:  Verbal   Consent given by:  Patient   Risks discussed:  Fracture, nerve damage, recurrent dislocation, irreducible dislocation, restricted joint movement, stiffness and vascular damage   Alternatives discussed:  No treatment and immobilizationInjury location: finger Location details: left little finger Injury type: fracture Fracture type: proximal phalanx MCP joint involved: no IP joint involved: no Pre-procedure neurovascular assessment: neurovascularly intact Pre-procedure distal perfusion: normal Pre-procedure neurological function: normal Pre-procedure range of motion: normal Anesthesia: digital block  Anesthesia: Local anesthesia used: yes Local Anesthetic: lidocaine 1% without epinephrine Anesthetic total: 4 mL Manipulation performed: yes Reduction successful: yes X-ray confirmed reduction: yes Immobilization: splint Splint type: ulnar gutter Splint Applied by: Ortho Tech Supplies used: Ortho-Glass Post-procedure neurovascular assessment: post-procedure neurovascularly intact Post-procedure distal perfusion: normal Post-procedure neurological function: normal Post-procedure range of motion: normal       Medications Ordered in ED Medications  oxyCODONE-acetaminophen (PERCOCET/ROXICET) 5-325 MG per tablet 2 tablet (2 tablets Oral Given 10/17/23 1908)  lidocaine (PF) (XYLOCAINE) 1 % injection 5 mL (5 mLs Infiltration Given 10/17/23 1936)    ED Course/ Medical Decision Making/ A&P                                  Medical Decision Making Amount and/or Complexity of Data Reviewed Radiology: ordered and independent interpretation performed. Decision-making details documented in ED Course.  Risk Prescription drug management.   42 year old female presents as above after fall with hand pain.  Considered includes fracture of the hand wrist forearm or elbow, dislocation, neurovascular compromise.  Distracting injury is considered but there is no evidence of this on her physical exam.  Physical exam is not consistent with neurovascular injury or compromise.  X-rays of the left hand and wrist were obtained and all my independent review are notable for minimally displaced left proximal phalanx fracture of the fifth digit with mild volar angulation.  X-rays of the left forearm and elbow are obtained and are notable for no acute fractures dislocations.    I discussed digital block with reduction of fracture to improve alignment prior to splinting with patient.  She verbally consented to the procedure.  See procedure note as above.  Postprocedural neurovascular exam continued to be intact.    Ulnar gutter splint placed by Ortho tech. Post procedure X ray redemonstrates fracture of the fifth digit proximal phalanx of the left hand but no more angulation/improved alignment. Strict return precautions discussed.  Sent prescription for percocet to preferred pharmacy for fracture pain refractory to OTC agents.  Instructed patient to call orthopedic clinic tomorrow morning; given phone number for hand surgeon on-call.  Discharged in stable condition.          Final Clinical Impression(s) / ED Diagnoses Final diagnoses:  Closed nondisplaced fracture of proximal phalanx of left little finger, initial encounter    Rx / DC Orders ED Discharge Orders          Ordered    oxyCODONE-acetaminophen (PERCOCET/ROXICET) 5-325 MG tablet  Every  6 hours PRN        10/17/23 2206              Karmen Stabs,  MD 10/17/23 2339    Tegeler, Canary Brim, MD 10/18/23 [phone]

## 2023-10-17 NOTE — Progress Notes (Signed)
Orthopedic Tech Progress Note Patient Details:  Sharon Gregory August 27, 1981 [id number] PA reduced pinky finger and we applied ulnar gutter per order.  Ortho Devices Type of Ortho Device: Ulna gutter splint, Ace wrap, Cotton web roll Ortho Device/Splint Location: LUE Ortho Device/Splint Interventions: Ordered, Application, Adjustment   Post Interventions Patient Tolerated: Well Instructions Provided: Adjustment of device, Care of device  Blase Mess 10/17/2023, 7:50 PM

## 2023-10-17 NOTE — ED Notes (Signed)
Pt provided with AVS.  Education complete; all questions answered.  Pt leaving ED in stable condition at this time, ambulatory with all belongings. 

## 2023-10-17 NOTE — ED Triage Notes (Signed)
Pt fell down stairs today at and landed on left hand and injured left 5th finger at 1615. Pt has minimal movement to pinky finger and ring finger. Finger is swollen. Radial pulse is present. Pain 10/10

## 2023-10-17 NOTE — Discharge Instructions (Signed)
Please call [phone] for surgery clinic follow-up for your broken finger.   Please do not get your splint wet or tried to remove it.  It needs to stay in place until you see a hand surgeon in clinic.   The emergency department if you develop numbness or tingling, loss of sensation in your hand or fingers, worsening pain or swelling beneath your splint, fevers or chills, or any new or concerning symptoms.

## 2023-10-18 DIAGNOSIS — S62617A Displaced fracture of proximal phalanx of left little finger, initial encounter for closed fracture: Secondary | ICD-10-CM | POA: Diagnosis not present

## 2023-10-19 ENCOUNTER — Other Ambulatory Visit (HOSPITAL_COMMUNITY): Payer: Self-pay

## 2023-10-21 ENCOUNTER — Other Ambulatory Visit: Payer: Self-pay

## 2023-10-21 ENCOUNTER — Other Ambulatory Visit (HOSPITAL_COMMUNITY): Payer: Self-pay

## 2023-10-21 MED ORDER — OXYCODONE HCL 5 MG PO TABS
5.0000 mg | ORAL_TABLET | Freq: Four times a day (QID) | ORAL | 0 refills | Status: DC | PRN
  Filled 2023-10-21 (×2): qty 15, 4d supply, fill #0

## 2023-10-29 ENCOUNTER — Other Ambulatory Visit (HOSPITAL_COMMUNITY): Payer: Self-pay

## 2023-11-01 DIAGNOSIS — S62617A Displaced fracture of proximal phalanx of left little finger, initial encounter for closed fracture: Secondary | ICD-10-CM | POA: Diagnosis not present

## 2023-11-04 ENCOUNTER — Other Ambulatory Visit (HOSPITAL_COMMUNITY): Payer: Self-pay

## 2023-11-04 ENCOUNTER — Other Ambulatory Visit: Payer: Self-pay

## 2023-11-04 MED ORDER — SERTRALINE HCL 50 MG PO TABS
50.0000 mg | ORAL_TABLET | Freq: Every evening | ORAL | 5 refills | Status: DC
  Filled 2023-11-04: qty 30, 30d supply, fill #0

## 2023-11-14 ENCOUNTER — Other Ambulatory Visit (HOSPITAL_COMMUNITY): Payer: Self-pay

## 2023-11-14 DIAGNOSIS — S62617A Displaced fracture of proximal phalanx of left little finger, initial encounter for closed fracture: Secondary | ICD-10-CM | POA: Diagnosis not present

## 2023-11-14 MED ORDER — CELECOXIB 100 MG PO CAPS
100.0000 mg | ORAL_CAPSULE | Freq: Two times a day (BID) | ORAL | 0 refills | Status: DC
  Filled 2023-11-14 (×2): qty 60, 30d supply, fill #0

## 2023-11-18 ENCOUNTER — Other Ambulatory Visit (HOSPITAL_COMMUNITY): Payer: Self-pay

## 2023-11-21 ENCOUNTER — Other Ambulatory Visit (HOSPITAL_COMMUNITY): Payer: Self-pay

## 2023-11-21 ENCOUNTER — Other Ambulatory Visit: Payer: Self-pay

## 2023-11-21 DIAGNOSIS — F4312 Post-traumatic stress disorder, chronic: Secondary | ICD-10-CM | POA: Diagnosis not present

## 2023-11-21 DIAGNOSIS — E1165 Type 2 diabetes mellitus with hyperglycemia: Secondary | ICD-10-CM | POA: Diagnosis not present

## 2023-11-21 DIAGNOSIS — J4520 Mild intermittent asthma, uncomplicated: Secondary | ICD-10-CM | POA: Diagnosis not present

## 2023-11-21 DIAGNOSIS — F4381 Prolonged grief disorder: Secondary | ICD-10-CM | POA: Diagnosis not present

## 2023-11-21 MED ORDER — MOUNJARO 10 MG/0.5ML ~~LOC~~ SOAJ
10.0000 mg | SUBCUTANEOUS | 2 refills | Status: DC
  Filled 2023-11-21: qty 2, 28d supply, fill #0
  Filled 2023-12-22: qty 2, 28d supply, fill #1
  Filled 2024-01-13: qty 2, 28d supply, fill #2

## 2023-11-21 MED ORDER — SERTRALINE HCL 100 MG PO TABS
100.0000 mg | ORAL_TABLET | Freq: Every evening | ORAL | 5 refills | Status: DC
  Filled 2023-11-21: qty 30, 30d supply, fill #0
  Filled 2023-12-22: qty 30, 30d supply, fill #1
  Filled 2024-01-26: qty 30, 30d supply, fill #2
  Filled 2024-02-21: qty 30, 30d supply, fill #3
  Filled 2024-03-22: qty 30, 30d supply, fill #4
  Filled 2024-04-20: qty 30, 30d supply, fill #5

## 2023-11-28 DIAGNOSIS — F3289 Other specified depressive episodes: Secondary | ICD-10-CM | POA: Diagnosis not present

## 2023-11-28 DIAGNOSIS — F4381 Prolonged grief disorder: Secondary | ICD-10-CM | POA: Diagnosis not present

## 2023-11-29 ENCOUNTER — Other Ambulatory Visit: Payer: Self-pay

## 2023-12-02 ENCOUNTER — Other Ambulatory Visit: Payer: Self-pay

## 2023-12-02 DIAGNOSIS — M79642 Pain in left hand: Secondary | ICD-10-CM | POA: Diagnosis not present

## 2023-12-02 DIAGNOSIS — S62617A Displaced fracture of proximal phalanx of left little finger, initial encounter for closed fracture: Secondary | ICD-10-CM | POA: Diagnosis not present

## 2023-12-03 ENCOUNTER — Other Ambulatory Visit: Payer: Self-pay

## 2023-12-22 ENCOUNTER — Other Ambulatory Visit (HOSPITAL_COMMUNITY): Payer: Self-pay

## 2023-12-23 ENCOUNTER — Other Ambulatory Visit: Payer: Self-pay

## 2023-12-23 MED ORDER — TIZANIDINE HCL 4 MG PO TABS
4.0000 mg | ORAL_TABLET | Freq: Two times a day (BID) | ORAL | 1 refills | Status: DC | PRN
  Filled 2023-12-23: qty 180, 90d supply, fill #0
  Filled 2024-03-22: qty 180, 90d supply, fill #1

## 2023-12-23 MED ORDER — NOREL AD 4-10-325 MG PO TABS
1.0000 | ORAL_TABLET | Freq: Two times a day (BID) | ORAL | 2 refills | Status: DC | PRN
  Filled 2023-12-23: qty 20, 10d supply, fill #0
  Filled 2024-04-15: qty 20, 10d supply, fill #1

## 2023-12-24 ENCOUNTER — Other Ambulatory Visit (HOSPITAL_BASED_OUTPATIENT_CLINIC_OR_DEPARTMENT_OTHER): Payer: Self-pay

## 2023-12-24 ENCOUNTER — Other Ambulatory Visit (HOSPITAL_COMMUNITY): Payer: Self-pay

## 2023-12-24 ENCOUNTER — Other Ambulatory Visit: Payer: Self-pay

## 2023-12-29 ENCOUNTER — Other Ambulatory Visit (HOSPITAL_COMMUNITY): Payer: Self-pay

## 2023-12-31 ENCOUNTER — Other Ambulatory Visit: Payer: Self-pay

## 2023-12-31 ENCOUNTER — Other Ambulatory Visit (HOSPITAL_COMMUNITY): Payer: Self-pay

## 2023-12-31 MED ORDER — GABAPENTIN 600 MG PO TABS
600.0000 mg | ORAL_TABLET | Freq: Three times a day (TID) | ORAL | 2 refills | Status: DC
  Filled 2023-12-31: qty 270, 90d supply, fill #0
  Filled 2024-03-30: qty 270, 90d supply, fill #1
  Filled 2024-06-24 – 2024-06-28 (×2): qty 270, 90d supply, fill #2

## 2023-12-31 MED ORDER — HYDROXYZINE HCL 25 MG PO TABS
25.0000 mg | ORAL_TABLET | Freq: Four times a day (QID) | ORAL | 2 refills | Status: AC | PRN
  Filled 2023-12-31: qty 180, 45d supply, fill #0
  Filled 2024-04-12: qty 180, 45d supply, fill #1

## 2024-01-01 ENCOUNTER — Other Ambulatory Visit: Payer: Self-pay

## 2024-01-01 ENCOUNTER — Other Ambulatory Visit (HOSPITAL_COMMUNITY): Payer: Self-pay

## 2024-01-01 MED ORDER — LEVOCETIRIZINE DIHYDROCHLORIDE 5 MG PO TABS
5.0000 mg | ORAL_TABLET | Freq: Every day | ORAL | 2 refills | Status: DC
  Filled 2024-01-01: qty 90, 90d supply, fill #0
  Filled 2024-03-30: qty 90, 90d supply, fill #1
  Filled 2024-06-28: qty 90, 90d supply, fill #2

## 2024-01-09 DIAGNOSIS — S62617A Displaced fracture of proximal phalanx of left little finger, initial encounter for closed fracture: Secondary | ICD-10-CM | POA: Diagnosis not present

## 2024-01-10 ENCOUNTER — Other Ambulatory Visit: Payer: Self-pay

## 2024-01-10 ENCOUNTER — Other Ambulatory Visit (HOSPITAL_COMMUNITY): Payer: Self-pay

## 2024-01-13 ENCOUNTER — Other Ambulatory Visit (HOSPITAL_COMMUNITY): Payer: Self-pay

## 2024-01-13 ENCOUNTER — Ambulatory Visit (HOSPITAL_BASED_OUTPATIENT_CLINIC_OR_DEPARTMENT_OTHER): Payer: Commercial Managed Care - PPO | Admitting: Family

## 2024-01-13 VITALS — BP 138/90 | HR 90 | Ht 64.75 in | Wt 264.0 lb

## 2024-01-13 DIAGNOSIS — F330 Major depressive disorder, recurrent, mild: Secondary | ICD-10-CM

## 2024-01-13 DIAGNOSIS — F4321 Adjustment disorder with depressed mood: Secondary | ICD-10-CM

## 2024-01-13 MED ORDER — ARIPIPRAZOLE 5 MG PO TABS
10.0000 mg | ORAL_TABLET | Freq: Every day | ORAL | 0 refills | Status: DC
  Filled 2024-01-13: qty 60, 30d supply, fill #0

## 2024-01-13 MED ORDER — ARIPIPRAZOLE 5 MG PO TABS
5.0000 mg | ORAL_TABLET | Freq: Every day | ORAL | 0 refills | Status: DC
  Filled 2024-01-13 (×3): qty 30, 30d supply, fill #0
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
## 2024-01-14 ENCOUNTER — Telehealth (HOSPITAL_COMMUNITY): Payer: Self-pay | Admitting: Psychiatry

## 2024-01-14 NOTE — Telephone Encounter (Signed)
 D:  Sharon Kerns, Sharon Gregory referred pt to virtual MH-IOP.  A:  Oriented pt.  Pt declined stating she works during the day.  Case manager recommended [facility] to pt.  Pt inquired if there were any other options besides groups?  Discussed individual counseling being an option.  Transferred pt to the front desk to inquire about a therapist.  Inform Tanika.

## 2024-01-17 ENCOUNTER — Other Ambulatory Visit: Payer: Self-pay

## 2024-01-17 ENCOUNTER — Other Ambulatory Visit (HOSPITAL_COMMUNITY): Payer: Self-pay

## 2024-01-17 ENCOUNTER — Ambulatory Visit: Payer: Commercial Managed Care - PPO | Attending: Orthopedic Surgery | Admitting: Occupational Therapy

## 2024-01-17 DIAGNOSIS — R29898 Other symptoms and signs involving the musculoskeletal system: Secondary | ICD-10-CM | POA: Insufficient documentation

## 2024-01-17 DIAGNOSIS — R278 Other lack of coordination: Secondary | ICD-10-CM | POA: Insufficient documentation

## 2024-01-17 DIAGNOSIS — R208 Other disturbances of skin sensation: Secondary | ICD-10-CM | POA: Diagnosis not present

## 2024-01-17 DIAGNOSIS — M6281 Muscle weakness (generalized): Secondary | ICD-10-CM | POA: Diagnosis not present

## 2024-01-17 MED ORDER — ZOLPIDEM TARTRATE ER 12.5 MG PO TBCR
12.5000 mg | EXTENDED_RELEASE_TABLET | Freq: Every evening | ORAL | 2 refills | Status: AC | PRN
  Filled 2024-01-17: qty 90, 90d supply, fill #0
  Filled 2024-04-12: qty 90, 90d supply, fill #1
  Filled 2024-07-13: qty 90, 90d supply, fill #2
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
## 2024-01-21 ENCOUNTER — Ambulatory Visit: Payer: Commercial Managed Care - PPO | Admitting: Occupational Therapy

## 2024-01-21 DIAGNOSIS — R208 Other disturbances of skin sensation: Secondary | ICD-10-CM

## 2024-01-21 DIAGNOSIS — M6281 Muscle weakness (generalized): Secondary | ICD-10-CM | POA: Diagnosis not present

## 2024-01-21 DIAGNOSIS — R29898 Other symptoms and signs involving the musculoskeletal system: Secondary | ICD-10-CM | POA: Diagnosis not present

## 2024-01-21 DIAGNOSIS — R278 Other lack of coordination: Secondary | ICD-10-CM
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
## 2024-01-21 NOTE — Patient Instructions (Addendum)
Sharon Gregory

## 2024-01-22 ENCOUNTER — Other Ambulatory Visit (HOSPITAL_COMMUNITY): Payer: Self-pay

## 2024-01-22 ENCOUNTER — Other Ambulatory Visit: Payer: Self-pay

## 2024-01-22 ENCOUNTER — Other Ambulatory Visit: Payer: Self-pay | Admitting: Internal Medicine

## 2024-01-22 MED ORDER — TOUJEO SOLOSTAR 300 UNIT/ML ~~LOC~~ SOPN
PEN_INJECTOR | SUBCUTANEOUS | 4 refills | Status: DC
  Filled 2024-01-22: qty 15, 36d supply, fill #0

## 2024-01-22 NOTE — Telephone Encounter (Signed)
[employer] sent instead of [employer]

## 2024-01-23 ENCOUNTER — Other Ambulatory Visit (HOSPITAL_COMMUNITY): Payer: Self-pay

## 2024-01-23 ENCOUNTER — Ambulatory Visit: Payer: Commercial Managed Care - PPO | Admitting: Occupational Therapy

## 2024-01-23 DIAGNOSIS — M6281 Muscle weakness (generalized): Secondary | ICD-10-CM

## 2024-01-23 DIAGNOSIS — R208 Other disturbances of skin sensation: Secondary | ICD-10-CM | POA: Diagnosis not present

## 2024-01-23 DIAGNOSIS — R278 Other lack of coordination: Secondary | ICD-10-CM

## 2024-01-23 DIAGNOSIS — R29898 Other symptoms and signs involving the musculoskeletal system: Secondary | ICD-10-CM
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
## 2024-01-27 ENCOUNTER — Other Ambulatory Visit (HOSPITAL_COMMUNITY): Payer: Self-pay

## 2024-01-27 ENCOUNTER — Other Ambulatory Visit: Payer: Self-pay

## 2024-01-28 ENCOUNTER — Ambulatory Visit: Payer: Commercial Managed Care - PPO | Admitting: Occupational Therapy

## 2024-01-28 ENCOUNTER — Ambulatory Visit (INDEPENDENT_AMBULATORY_CARE_PROVIDER_SITE_OTHER): Payer: Commercial Managed Care - PPO

## 2024-01-28 VITALS — BP 146/90 | HR 101 | Resp 20 | Ht 64.75 in | Wt 263.4 lb

## 2024-01-28 DIAGNOSIS — R208 Other disturbances of skin sensation: Secondary | ICD-10-CM | POA: Diagnosis not present

## 2024-01-28 DIAGNOSIS — E1165 Type 2 diabetes mellitus with hyperglycemia: Secondary | ICD-10-CM

## 2024-01-28 DIAGNOSIS — Z794 Long term (current) use of insulin: Secondary | ICD-10-CM

## 2024-01-28 DIAGNOSIS — R29898 Other symptoms and signs involving the musculoskeletal system: Secondary | ICD-10-CM | POA: Diagnosis not present

## 2024-01-28 DIAGNOSIS — M6281 Muscle weakness (generalized): Secondary | ICD-10-CM | POA: Diagnosis not present

## 2024-01-28 DIAGNOSIS — R278 Other lack of coordination: Secondary | ICD-10-CM | POA: Diagnosis not present

## 2024-01-28 LAB — POCT GLYCOSYLATED HEMOGLOBIN (HGB A1C): Hemoglobin A1C: 7.9 % — AB (ref 4.0–5.6)
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
## 2024-01-30 ENCOUNTER — Ambulatory Visit: Payer: Commercial Managed Care - PPO | Admitting: Occupational Therapy
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
## 2024-01-31 ENCOUNTER — Other Ambulatory Visit: Payer: Self-pay

## 2024-01-31 ENCOUNTER — Telehealth (INDEPENDENT_AMBULATORY_CARE_PROVIDER_SITE_OTHER): Payer: Commercial Managed Care - PPO | Admitting: Internal Medicine

## 2024-01-31 ENCOUNTER — Other Ambulatory Visit (HOSPITAL_COMMUNITY): Payer: Self-pay

## 2024-01-31 ENCOUNTER — Encounter (HOSPITAL_COMMUNITY): Payer: Self-pay

## 2024-01-31 ENCOUNTER — Encounter: Payer: Self-pay | Admitting: Internal Medicine

## 2024-01-31 DIAGNOSIS — E11319 Type 2 diabetes mellitus with unspecified diabetic retinopathy without macular edema: Secondary | ICD-10-CM | POA: Diagnosis not present

## 2024-01-31 DIAGNOSIS — Z794 Long term (current) use of insulin: Secondary | ICD-10-CM

## 2024-01-31 DIAGNOSIS — E1142 Type 2 diabetes mellitus with diabetic polyneuropathy: Secondary | ICD-10-CM

## 2024-01-31 DIAGNOSIS — E785 Hyperlipidemia, unspecified: Secondary | ICD-10-CM | POA: Diagnosis not present

## 2024-01-31 DIAGNOSIS — R809 Proteinuria, unspecified: Secondary | ICD-10-CM

## 2024-01-31 DIAGNOSIS — E1129 Type 2 diabetes mellitus with other diabetic kidney complication: Secondary | ICD-10-CM | POA: Diagnosis not present

## 2024-01-31 DIAGNOSIS — E1165 Type 2 diabetes mellitus with hyperglycemia: Secondary | ICD-10-CM

## 2024-01-31 MED ORDER — TOUJEO MAX SOLOSTAR 300 UNIT/ML ~~LOC~~ SOPN
112.0000 [IU] | PEN_INJECTOR | Freq: Every day | SUBCUTANEOUS | 3 refills | Status: DC
  Filled 2024-01-31: qty 15, 40d supply, fill #0
  Filled 2024-04-09: qty 15, 40d supply, fill #1
  Filled 2024-08-17: qty 15, 40d supply, fill #2
  Filled 2024-09-23: qty 15, 40d supply, fill #3

## 2024-01-31 MED ORDER — HUMALOG KWIKPEN 200 UNIT/ML ~~LOC~~ SOPN
80.0000 [IU] | PEN_INJECTOR | Freq: Every day | SUBCUTANEOUS | 3 refills | Status: DC
  Filled 2024-01-31: qty 12, 30d supply, fill #0
  Filled 2024-01-31: qty 30, 75d supply, fill #0

## 2024-01-31 MED ORDER — LOSARTAN POTASSIUM 25 MG PO TABS
25.0000 mg | ORAL_TABLET | Freq: Every day | ORAL | 3 refills | Status: DC
  Filled 2024-01-31 – 2024-02-25 (×2): qty 90, 90d supply, fill #0
  Filled 2024-05-25: qty 90, 90d supply, fill #1
  Filled 2024-08-20: qty 90, 90d supply, fill #2

## 2024-01-31 MED ORDER — UNIFINE PENTIPS 32G X 6 MM MISC
1.0000 | Freq: Four times a day (QID) | 3 refills | Status: DC
  Filled 2024-01-31: qty 400, 90d supply, fill #0
  Filled 2024-08-20: qty 400, 90d supply, fill #1

## 2024-01-31 MED ORDER — METFORMIN HCL ER 500 MG PO TB24
500.0000 mg | ORAL_TABLET | Freq: Two times a day (BID) | ORAL | 2 refills | Status: DC
  Filled 2024-01-31 – 2024-04-09 (×2): qty 180, 90d supply, fill #0
  Filled 2024-07-08: qty 180, 90d supply, fill #1

## 2024-01-31 MED ORDER — MOUNJARO 10 MG/0.5ML ~~LOC~~ SOAJ
10.0000 mg | SUBCUTANEOUS | 2 refills | Status: DC
  Filled 2024-01-31 – 2024-02-13 (×2): qty 4, 56d supply, fill #0
  Filled 2024-04-09: qty 4, 56d supply, fill #1
  Filled 2024-06-04: qty 4, 56d supply, fill #2

## 2024-02-04 ENCOUNTER — Ambulatory Visit: Payer: Commercial Managed Care - PPO | Admitting: Occupational Therapy

## 2024-02-04 DIAGNOSIS — M6281 Muscle weakness (generalized): Secondary | ICD-10-CM | POA: Diagnosis not present

## 2024-02-04 DIAGNOSIS — R29898 Other symptoms and signs involving the musculoskeletal system: Secondary | ICD-10-CM

## 2024-02-04 DIAGNOSIS — R278 Other lack of coordination: Secondary | ICD-10-CM | POA: Diagnosis not present

## 2024-02-04 DIAGNOSIS — R208 Other disturbances of skin sensation: Secondary | ICD-10-CM | POA: Diagnosis not present
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
## 2024-02-05 ENCOUNTER — Other Ambulatory Visit (HOSPITAL_COMMUNITY): Payer: Self-pay | Admitting: Family

## 2024-02-06 ENCOUNTER — Ambulatory Visit: Payer: Commercial Managed Care - PPO | Admitting: Occupational Therapy

## 2024-02-06 DIAGNOSIS — R278 Other lack of coordination: Secondary | ICD-10-CM | POA: Diagnosis not present

## 2024-02-06 DIAGNOSIS — R208 Other disturbances of skin sensation: Secondary | ICD-10-CM | POA: Diagnosis not present

## 2024-02-06 DIAGNOSIS — R29898 Other symptoms and signs involving the musculoskeletal system: Secondary | ICD-10-CM | POA: Diagnosis not present

## 2024-02-06 DIAGNOSIS — M6281 Muscle weakness (generalized): Secondary | ICD-10-CM | POA: Diagnosis not present

## 2024-02-06 NOTE — Patient Instructions (Addendum)
 Perform the following activities for 10 minutes 2x per day:  Rotate ball in fingertips back and forth - 3x left hand - "Keep thumb on the ball at all times as you rotate the ball across fingertips."  Rotate one card on tabletop (clockwise then counter-clockwise) - 4 rotations each direction, Challenge: Use thumb to stabilize card then use pinky only to rotate  Finger opposition to thumb - alternate moving thumb to pointer finger, then middle finger, then ring finger, then pinky - Left hand, 5 reps-10 reps  Stack towers of 1-inch block - 5 towers of 4-5 blocks each, building with only pinky and thumb. Try to avoid sliding block along table.
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
## 2024-02-11 ENCOUNTER — Ambulatory Visit: Payer: Commercial Managed Care - PPO | Attending: Orthopedic Surgery | Admitting: Occupational Therapy

## 2024-02-11 DIAGNOSIS — M6281 Muscle weakness (generalized): Secondary | ICD-10-CM | POA: Insufficient documentation

## 2024-02-11 DIAGNOSIS — R208 Other disturbances of skin sensation: Secondary | ICD-10-CM | POA: Diagnosis not present

## 2024-02-11 DIAGNOSIS — R29898 Other symptoms and signs involving the musculoskeletal system: Secondary | ICD-10-CM | POA: Diagnosis not present

## 2024-02-11 DIAGNOSIS — R278 Other lack of coordination: Secondary | ICD-10-CM | POA: Insufficient documentation
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
## 2024-02-13 ENCOUNTER — Other Ambulatory Visit: Payer: Self-pay

## 2024-02-13 ENCOUNTER — Ambulatory Visit: Payer: Commercial Managed Care - PPO | Admitting: Occupational Therapy

## 2024-02-13 DIAGNOSIS — R278 Other lack of coordination: Secondary | ICD-10-CM

## 2024-02-13 DIAGNOSIS — R208 Other disturbances of skin sensation: Secondary | ICD-10-CM | POA: Diagnosis not present

## 2024-02-13 DIAGNOSIS — R29898 Other symptoms and signs involving the musculoskeletal system: Secondary | ICD-10-CM

## 2024-02-13 DIAGNOSIS — M6281 Muscle weakness (generalized): Secondary | ICD-10-CM | POA: Diagnosis not present
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
## 2024-02-18 ENCOUNTER — Ambulatory Visit: Payer: Commercial Managed Care - PPO | Admitting: Occupational Therapy

## 2024-02-18 DIAGNOSIS — R29898 Other symptoms and signs involving the musculoskeletal system: Secondary | ICD-10-CM

## 2024-02-18 DIAGNOSIS — M6281 Muscle weakness (generalized): Secondary | ICD-10-CM | POA: Diagnosis not present

## 2024-02-18 DIAGNOSIS — R278 Other lack of coordination: Secondary | ICD-10-CM | POA: Diagnosis not present

## 2024-02-18 DIAGNOSIS — R208 Other disturbances of skin sensation: Secondary | ICD-10-CM | POA: Diagnosis not present
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
## 2024-02-19 ENCOUNTER — Other Ambulatory Visit (HOSPITAL_COMMUNITY): Payer: Self-pay

## 2024-02-20 ENCOUNTER — Ambulatory Visit: Payer: Commercial Managed Care - PPO | Admitting: Occupational Therapy

## 2024-02-20 DIAGNOSIS — R29898 Other symptoms and signs involving the musculoskeletal system: Secondary | ICD-10-CM

## 2024-02-20 DIAGNOSIS — M6281 Muscle weakness (generalized): Secondary | ICD-10-CM | POA: Diagnosis not present

## 2024-02-20 DIAGNOSIS — R278 Other lack of coordination: Secondary | ICD-10-CM | POA: Diagnosis not present

## 2024-02-20 DIAGNOSIS — R208 Other disturbances of skin sensation: Secondary | ICD-10-CM
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
## 2024-02-21 ENCOUNTER — Other Ambulatory Visit: Payer: Self-pay

## 2024-02-25 ENCOUNTER — Other Ambulatory Visit (HOSPITAL_COMMUNITY): Payer: Self-pay

## 2024-02-25 ENCOUNTER — Ambulatory Visit: Payer: Commercial Managed Care - PPO | Admitting: Occupational Therapy

## 2024-02-25 ENCOUNTER — Encounter: Payer: Self-pay | Admitting: Internal Medicine

## 2024-02-25 DIAGNOSIS — R29898 Other symptoms and signs involving the musculoskeletal system: Secondary | ICD-10-CM

## 2024-02-25 DIAGNOSIS — R278 Other lack of coordination: Secondary | ICD-10-CM

## 2024-02-25 DIAGNOSIS — R208 Other disturbances of skin sensation: Secondary | ICD-10-CM

## 2024-02-25 DIAGNOSIS — M6281 Muscle weakness (generalized): Secondary | ICD-10-CM
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
## 2024-02-27 ENCOUNTER — Ambulatory Visit: Payer: Commercial Managed Care - PPO | Admitting: Occupational Therapy

## 2024-02-27 DIAGNOSIS — R278 Other lack of coordination: Secondary | ICD-10-CM

## 2024-02-27 DIAGNOSIS — R29898 Other symptoms and signs involving the musculoskeletal system: Secondary | ICD-10-CM | POA: Diagnosis not present

## 2024-02-27 DIAGNOSIS — R208 Other disturbances of skin sensation: Secondary | ICD-10-CM | POA: Diagnosis not present

## 2024-02-27 DIAGNOSIS — M6281 Muscle weakness (generalized): Secondary | ICD-10-CM | POA: Diagnosis not present
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
## 2024-03-03 ENCOUNTER — Ambulatory Visit: Payer: Self-pay | Admitting: Occupational Therapy

## 2024-03-04 ENCOUNTER — Other Ambulatory Visit (HOSPITAL_COMMUNITY): Payer: Self-pay

## 2024-03-04 ENCOUNTER — Other Ambulatory Visit: Payer: Self-pay

## 2024-03-04 MED ORDER — ESOMEPRAZOLE MAGNESIUM 40 MG PO CPDR
40.0000 mg | DELAYED_RELEASE_CAPSULE | Freq: Every day | ORAL | 1 refills | Status: DC
  Filled 2024-03-04: qty 90, 90d supply, fill #0
  Filled 2024-05-29: qty 90, 90d supply, fill #1

## 2024-03-05 DIAGNOSIS — S62617A Displaced fracture of proximal phalanx of left little finger, initial encounter for closed fracture: Secondary | ICD-10-CM | POA: Diagnosis not present

## 2024-03-07 ENCOUNTER — Encounter (HOSPITAL_BASED_OUTPATIENT_CLINIC_OR_DEPARTMENT_OTHER): Payer: Self-pay

## 2024-03-07 ENCOUNTER — Emergency Department (HOSPITAL_BASED_OUTPATIENT_CLINIC_OR_DEPARTMENT_OTHER)
Admission: EM | Admit: 2024-03-07 | Discharge: 2024-03-07 | Disposition: A | Discharge status: Home / Self Care | Attending: Emergency Medicine | Admitting: Emergency Medicine

## 2024-03-07 DIAGNOSIS — R1011 Right upper quadrant pain: Secondary | ICD-10-CM | POA: Insufficient documentation

## 2024-03-07 DIAGNOSIS — L0390 Cellulitis, unspecified: Secondary | ICD-10-CM

## 2024-03-07 DIAGNOSIS — Z7984 Long term (current) use of oral hypoglycemic drugs: Secondary | ICD-10-CM | POA: Insufficient documentation

## 2024-03-07 DIAGNOSIS — Z79899 Other long term (current) drug therapy: Secondary | ICD-10-CM | POA: Insufficient documentation

## 2024-03-07 DIAGNOSIS — Z794 Long term (current) use of insulin: Secondary | ICD-10-CM | POA: Insufficient documentation

## 2024-03-07 DIAGNOSIS — L03311 Cellulitis of abdominal wall: Secondary | ICD-10-CM | POA: Diagnosis not present

## 2024-03-07 MED ORDER — DOXYCYCLINE HYCLATE 100 MG PO CAPS: 100.0000 mg | ORAL_CAPSULE | Freq: Two times a day (BID) | ORAL | 0 refills | Status: DC

## 2024-03-07 MED ORDER — OXYCODONE-ACETAMINOPHEN 5-325 MG PO TABS: 1.0000 | ORAL_TABLET | Freq: Four times a day (QID) | ORAL | 0 refills | Status: DC | PRN

## 2024-03-07 MED ORDER — CEPHALEXIN 500 MG PO CAPS: 500.0000 mg | ORAL_CAPSULE | Freq: Four times a day (QID) | ORAL | 0 refills | Status: DC

## 2024-03-07 MED ORDER — FLUCONAZOLE 150 MG PO TABS: 150.0000 mg | ORAL_TABLET | Freq: Every day | ORAL | 0 refills | Status: DC

## 2024-03-07 MED ORDER — OXYCODONE-ACETAMINOPHEN 5-325 MG PO TABS
2.0000 | ORAL_TABLET | Freq: Once | ORAL | Status: AC
  Administered 2024-03-07: 2 via ORAL
  Filled 2024-03-07: qty 2

## 2024-03-07 MED ORDER — KETOROLAC TROMETHAMINE 30 MG/ML IJ SOLN
30.0000 mg | Freq: Once | INTRAMUSCULAR | Status: AC
  Administered 2024-03-07: 30 mg via INTRAMUSCULAR
  Filled 2024-03-07: qty 1

## 2024-03-07 NOTE — ED Provider Notes (Signed)
 [place] EMERGENCY DEPARTMENT AT [facility] Provider Note   CSN: [id number] Arrival date & time: 03/07/24  1200     History  Chief Complaint  Patient presents with   Abscess    Sharon Gregory is a 43 y.o. female.  43 year old female presents with 6 days of right upper abdominal wall area of tenderness.  States that she has a hard bump in that area.  Denies any fever or chills.  Denied any drainage.  States that the pain is sharp and worse any movement.  No prior history of same and has been medicating with Tylenol with limited relief       Home Medications Prior to Admission medications   Medication Sig Start Date End Date Taking? Authorizing Provider  albuterol (VENTOLIN HFA) 108 (90 Base) MCG/ACT inhaler Inhale 2 puffs into the lungs 4 (four) times daily as needed. 09/24/23     ARIPiprazole (ABILIFY) 5 MG tablet Take 1 tablet (5 mg total) by mouth daily. 01/13/24   Oneta Rack, NP  Boric Acid POWD as directed Vaginally Once a week 05/24/23   [provider]  Chlorphen-PE-Acetaminophen (NOREL AD) 4-10-325 MG TABS Take 1 tablet by mouth 2 (two) times daily as needed for cough and congestion 12/23/23     Continuous Glucose Sensor (FREESTYLE LIBRE 3 SENSOR) MISC Place 1 sensor on the skin every 14 days. Use to check glucose continuously Patient not taking: Reported on 01/31/2024 07/24/23   Shamleffer, Konrad Dolores, MD  esomeprazole (NEXIUM) 40 MG capsule Take 1 capsule (40 mg total) by mouth daily. 03/04/24     Fluocinolone Acetonide 0.01 % OIL Apply topically in affected ear(s) twice daily as needed. 07/05/21     fluticasone (FLONASE) 50 MCG/ACT nasal spray Place 2 sprays into both nostrils daily. 05/28/23     Fluticasone-Umeclidin-Vilant (TRELEGY ELLIPTA) 200-62.5-25 MCG/ACT AEPB Inhale 1 puff into the lungs daily. 09/24/23     gabapentin (NEURONTIN) 600 MG tablet Take 1 tablet (600 mg total) by mouth 3 (three) times daily. 12/31/23     glucose blood  (FREESTYLE LITE) test strip Use as directed to test blood glucose levels three times daily. 10/16/21     hydrOXYzine (ATARAX) 25 MG tablet Take 1 tablet (25 mg total) by mouth every 6 (six) hours as needed for itching. 12/31/23     insulin glargine, 2 Unit Dial, (TOUJEO MAX SOLOSTAR) 300 UNIT/ML Solostar Pen Inject 112 Units into the skin daily in the afternoon. 01/31/24   Shamleffer, Konrad Dolores, MD  insulin lispro (HUMALOG KWIKPEN) 200 UNIT/ML KwikPen use as directed - maximum 80 units daily per scale 01/31/24   Shamleffer, Konrad Dolores, MD  Insulin Pen Needle (UNIFINE PENTIPS) 32G X 6 MM MISC Use in the morning, at noon, in the evening, and at bedtime as directed 01/31/24   Shamleffer, Konrad Dolores, MD  Lancets (FREESTYLE) lancets use for blood glucose testing three times daily 07/27/23     levocetirizine (XYZAL) 5 MG tablet Take 1 tablet (5 mg total) by mouth daily. 12/31/23   Fleet Contras, MD  losartan (COZAAR) 25 MG tablet Take 1 tablet (25 mg total) by mouth daily. 01/31/24   Shamleffer, Konrad Dolores, MD  meloxicam (MOBIC) 15 MG tablet Take 1 tablet (15 mg total) by mouth daily with food 11/22/21     metFORMIN (GLUCOPHAGE-XR) 500 MG 24 hr tablet Take 1 tablet (500 mg total) by mouth 2 (two) times daily with a meal. 01/31/24   Shamleffer, Konrad Dolores, MD  promethazine (  PHENERGAN) 25 MG tablet Take 1 tablet (25 mg total) by mouth 2 (two) times daily as needed. 04/01/23     rosuvastatin (CRESTOR) 40 MG tablet Take 1 tablet (40 mg total) by mouth daily. 03/01/23   Shamleffer, Konrad Dolores, MD  sertraline (ZOLOFT) 100 MG tablet Take 1 tablet (100 mg total) by mouth every evening. 11/21/23     tirzepatide (MOUNJARO) 10 MG/0.5ML Pen Inject 10 mg into the skin every 7 (seven) days. 01/31/24   Shamleffer, Konrad Dolores, MD  tiZANidine (ZANAFLEX) 4 MG tablet Take 1 tablet (4 mg total) by mouth 2 (two) times daily as needed. 12/23/23     tobramycin-dexamethasone (TOBRADEX) ophthalmic solution  Shake liquid and place 2 drops in affected eye(s) every 6 hours as needed. 11/14/22     zolpidem (AMBIEN CR) 12.5 MG CR tablet Take 1 tablet (12.5 mg total) by mouth at bedtime as needed. 01/17/24         Allergies    Kiwi extract and Molds & smuts    Review of Systems   Review of Systems  All other systems reviewed and are negative.   Physical Exam Updated Vital Signs BP (!) 143/92 (BP Location: Right Arm)   Pulse (!) 104   Temp 98.9 F (37.2 C)   Resp 18   SpO2 100%  Physical Exam Vitals and nursing note reviewed.  Constitutional:      General: She is not in acute distress.    Appearance: Normal appearance. She is well-developed. She is not toxic-appearing.  HENT:     Head: Normocephalic and atraumatic.  Eyes:     General: Lids are normal.     Conjunctiva/sclera: Conjunctivae normal.     Pupils: Pupils are equal, round, and reactive to light.  Neck:     Thyroid: No thyroid mass.     Trachea: No tracheal deviation.  Cardiovascular:     Rate and Rhythm: Normal rate and regular rhythm.     Heart sounds: Normal heart sounds. No murmur heard.    No gallop.  Pulmonary:     Effort: Pulmonary effort is normal. No respiratory distress.     Breath sounds: Normal breath sounds. No stridor. No decreased breath sounds, wheezing, rhonchi or rales.  Abdominal:     General: There is no distension.     Palpations: Abdomen is soft.     Tenderness: There is no abdominal tenderness. There is no rebound.    Musculoskeletal:        General: No tenderness. Normal range of motion.     Cervical back: Normal range of motion and neck supple.  Skin:    General: Skin is warm and dry.     Findings: No abrasion or rash.  Neurological:     Mental Status: She is alert and oriented to person, place, and time. Mental status is at baseline.     GCS: GCS eye subscore is 4. GCS verbal subscore is 5. GCS motor subscore is 6.     Cranial Nerves: No cranial nerve deficit.     Sensory: No sensory  deficit.     Motor: Motor function is intact.  Psychiatric:        Attention and Perception: Attention normal.        Speech: Speech normal.        Behavior: Behavior normal.     ED Results / Procedures / Treatments   Labs (all labs ordered are listed, but only abnormal results are displayed) Labs Reviewed -  No data to display  EKG None  Radiology No results found.  Procedures Procedures    Medications Ordered in ED Medications  oxyCODONE-acetaminophen (PERCOCET/ROXICET) 5-325 MG per tablet 2 tablet (has no administration in time range)    ED Course/ Medical Decision Making/ A&P                                 Medical Decision Making Risk Prescription drug management.   Patient with likely cellulitis.  No drainable abscess appreciated.  Given Toradol for pain here.  Will prescribe doxycycline and Keflex.  Will also prophylactically treat for possible UTI Candida infection per patient's request with Diflucan.  Return precautions given        Final Clinical Impression(s) / ED Diagnoses Final diagnoses:  None    Rx / DC Orders ED Discharge Orders     None         Lorre Nick, MD 03/07/24 1313

## 2024-03-07 NOTE — ED Triage Notes (Signed)
 She reports having a "hard bump" at upper right abd. Contiguous to her lower right breast, x 6 days. She is in no distress.

## 2024-03-12 ENCOUNTER — Ambulatory Visit: Payer: Self-pay | Admitting: Occupational Therapy

## 2024-03-12 ENCOUNTER — Telehealth: Payer: Self-pay | Admitting: Occupational Therapy

## 2024-03-12 NOTE — Telephone Encounter (Signed)
 Pt cancelled today's OT appointment. OT called pt d/t this was second instance of cancel within 24 hours. OT noted pt went to ED on 03/07/24 per chart review. Pt reported continuing to feel unwell since visit to ED though hopes to return by next OT appointment. Pt aware of date/time of next OT appointment. Pt provided updates from recent visit to MD, including to continue therapy sessions.

## 2024-03-13 ENCOUNTER — Other Ambulatory Visit: Payer: Self-pay

## 2024-03-13 ENCOUNTER — Other Ambulatory Visit (HOSPITAL_COMMUNITY): Payer: Self-pay

## 2024-03-13 ENCOUNTER — Encounter (HOSPITAL_BASED_OUTPATIENT_CLINIC_OR_DEPARTMENT_OTHER): Payer: Self-pay | Admitting: Emergency Medicine

## 2024-03-13 ENCOUNTER — Emergency Department (HOSPITAL_BASED_OUTPATIENT_CLINIC_OR_DEPARTMENT_OTHER)
Admission: EM | Admit: 2024-03-13 | Discharge: 2024-03-13 | Disposition: A | Discharge status: Home / Self Care | Attending: Emergency Medicine | Admitting: Emergency Medicine

## 2024-03-13 DIAGNOSIS — L02213 Cutaneous abscess of chest wall: Secondary | ICD-10-CM | POA: Insufficient documentation

## 2024-03-13 DIAGNOSIS — M7918 Myalgia, other site: Secondary | ICD-10-CM | POA: Diagnosis not present

## 2024-03-13 DIAGNOSIS — L03313 Cellulitis of chest wall: Secondary | ICD-10-CM | POA: Insufficient documentation

## 2024-03-13 LAB — COMPREHENSIVE METABOLIC PANEL WITH GFR
ALT: 43 U/L (ref 0–44)
AST: 28 U/L (ref 15–41)
Albumin: 4.1 g/dL (ref 3.5–5.0)
Alkaline Phosphatase: 75 U/L (ref 38–126)
Anion gap: 9 (ref 5–15)
BUN: 13 mg/dL (ref 6–20)
CO2: 26 mmol/L (ref 22–32)
Calcium: 9.5 mg/dL (ref 8.9–10.3)
Chloride: 102 mmol/L (ref 98–111)
Creatinine, Ser: 0.75 mg/dL (ref 0.44–1.00)
GFR, Estimated: >60 mL/min (ref >60)
Glucose, Bld: 191 mg/dL — ABNORMAL HIGH (ref 70–99)
Potassium: 3.8 mmol/L (ref 3.5–5.1)
Sodium: 137 mmol/L (ref 135–145)
Total Bilirubin: 0.3 mg/dL (ref 0.0–1.2)
Total Protein: 7.6 g/dL (ref 6.5–8.1)

## 2024-03-13 LAB — CBC WITH DIFFERENTIAL/PLATELET
Abs Immature Granulocytes: 0.06 10*3/uL (ref 0.00–0.07)
Basophils Absolute: 0 10*3/uL (ref 0.0–0.1)
Basophils Relative: 0 %
Eosinophils Absolute: 0.1 10*3/uL (ref 0.0–0.5)
Eosinophils Relative: 2 %
HCT: 34.7 % — ABNORMAL LOW (ref 36.0–46.0)
Hemoglobin: 11.3 g/dL — ABNORMAL LOW (ref 12.0–15.0)
Immature Granulocytes: 1 %
Lymphocytes Relative: 38 %
Lymphs Abs: 2.9 10*3/uL (ref 0.7–4.0)
MCH: 26.9 pg (ref 26.0–34.0)
MCHC: 32.6 g/dL (ref 30.0–36.0)
MCV: 82.6 fL (ref 80.0–100.0)
Monocytes Absolute: 0.6 10*3/uL (ref 0.1–1.0)
Monocytes Relative: 8 %
Neutro Abs: 3.9 10*3/uL (ref 1.7–7.7)
Neutrophils Relative %: 51 %
Platelets: 305 10*3/uL (ref 150–400)
RBC: 4.2 MIL/uL (ref 3.87–5.11)
RDW: 12.2 % (ref 11.5–15.5)
WBC: 7.6 10*3/uL (ref 4.0–10.5)
nRBC: 0 % (ref 0.0–0.2)

## 2024-03-13 MED ORDER — LIDOCAINE HCL (PF) 1 % IJ SOLN
30.0000 mL | Freq: Once | INTRAMUSCULAR | Status: AC
  Administered 2024-03-13: 30 mL
  Filled 2024-03-13: qty 30

## 2024-03-13 MED ORDER — MORPHINE SULFATE (PF) 4 MG/ML IV SOLN
4.0000 mg | Freq: Once | INTRAVENOUS | Status: AC
  Administered 2024-03-13: 4 mg via INTRAVENOUS
  Filled 2024-03-13: qty 1

## 2024-03-13 MED ORDER — CEFAZOLIN SODIUM-DEXTROSE 2-4 GM/100ML-% IV SOLN
2.0000 g | Freq: Once | INTRAVENOUS | Status: AC
  Administered 2024-03-13: 2 g via INTRAVENOUS
  Filled 2024-03-13: qty 100

## 2024-03-13 MED ORDER — ONDANSETRON HCL 4 MG/2ML IJ SOLN
4.0000 mg | Freq: Once | INTRAMUSCULAR | Status: AC
  Administered 2024-03-13: 4 mg via INTRAVENOUS
  Filled 2024-03-13: qty 2

## 2024-03-13 MED ORDER — OXYCODONE-ACETAMINOPHEN 5-325 MG PO TABS
1.0000 | ORAL_TABLET | Freq: Once | ORAL | Status: AC
  Administered 2024-03-13: 1 via ORAL
  Filled 2024-03-13: qty 1

## 2024-03-13 MED ORDER — SULFAMETHOXAZOLE-TRIMETHOPRIM 800-160 MG PO TABS
1.0000 | ORAL_TABLET | Freq: Two times a day (BID) | ORAL | 0 refills | Status: DC
  Filled 2024-03-13: qty 14, 7d supply, fill #0

## 2024-03-13 MED ORDER — SULFAMETHOXAZOLE-TRIMETHOPRIM 800-160 MG PO TABS
1.0000 | ORAL_TABLET | Freq: Two times a day (BID) | ORAL | 0 refills | Status: AC
Start: 2024-03-13 — End: 2024-03-20

## 2024-03-13 NOTE — ED Triage Notes (Signed)
 2 weeks of cellulitis to abd under right breast. Sent home 3/29 with antibiotics. Completed one- still taking doxy. Continues to feel painful and has not seemed to reduce in size. Occasional body aches and chills- afebrile. HX diabetes-well controlled recently.

## 2024-03-13 NOTE — ED Notes (Signed)
 ED Provider at bedside performing I&D procedure.

## 2024-03-13 NOTE — Discharge Instructions (Addendum)
 While you were in the emergency room, you had an abscess on your chest drained.  I would like you to stop taking the antibiotics that you were prescribed.  You may begin taking Bactrim once in the morning and once in the evening for the next 7 days.  You may begin taking that this evening.  You can take Motrin and Tylenol at home for pain.  Like we discussed, that area will have a small amount of bleeding and oozing.  This is normal, and is how the infection will drain out.  You should begin to notice an improvement in your symptoms over the next few days.  Please follow-up with your primary care doctor within 1 week for evaluation of your wound.  Return to the emergency room if you develop fever, chills, or increasing pain.

## 2024-03-13 NOTE — ED Provider Notes (Signed)
 [place] EMERGENCY DEPARTMENT AT [facility] Provider Note   CSN: [id number] Arrival date & time: 03/13/24  1501     History  Chief Complaint  Patient presents with   Cellulitis    Sharon Gregory is a 43 y.o. female.  This is a 43 year old female who is here today for pain under the right breast.  Patient was seen on 3/29 of this year, discharged with antibiotics.  She has not felt as though she is improved.  Patient reports that occasionally she has had bodyaches and felt chills.        Home Medications Prior to Admission medications   Medication Sig Start Date End Date Taking? Authorizing Provider  sulfamethoxazole-trimethoprim (BACTRIM DS) 800-160 MG tablet Take 1 tablet by mouth 2 (two) times daily for 7 days. 03/13/24 03/20/24 Yes Anders Simmonds T, DO  albuterol (VENTOLIN HFA) 108 (90 Base) MCG/ACT inhaler Inhale 2 puffs into the lungs 4 (four) times daily as needed. 09/24/23     ARIPiprazole (ABILIFY) 5 MG tablet Take 1 tablet (5 mg total) by mouth daily. 01/13/24   Oneta Rack, NP  Boric Acid POWD as directed Vaginally Once a week 05/24/23   [provider]  cephALEXin (KEFLEX) 500 MG capsule Take 1 capsule (500 mg total) by mouth 4 (four) times daily. 03/07/24   Lorre Nick, MD  Chlorphen-PE-Acetaminophen (NOREL AD) 4-10-325 MG TABS Take 1 tablet by mouth 2 (two) times daily as needed for cough and congestion 12/23/23     Continuous Glucose Sensor (FREESTYLE LIBRE 3 SENSOR) MISC Place 1 sensor on the skin every 14 days. Use to check glucose continuously Patient not taking: Reported on 01/31/2024 07/24/23   Shamleffer, Konrad Dolores, MD  doxycycline (VIBRAMYCIN) 100 MG capsule Take 1 capsule (100 mg total) by mouth 2 (two) times daily. 03/07/24   Lorre Nick, MD  esomeprazole (NEXIUM) 40 MG capsule Take 1 capsule (40 mg total) by mouth daily. 03/04/24     fluconazole (DIFLUCAN) 150 MG tablet Take 1 tablet (150 mg total) by mouth daily. 03/07/24    Lorre Nick, MD  Fluocinolone Acetonide 0.01 % OIL Apply topically in affected ear(s) twice daily as needed. 07/05/21     fluticasone (FLONASE) 50 MCG/ACT nasal spray Place 2 sprays into both nostrils daily. 05/28/23     Fluticasone-Umeclidin-Vilant (TRELEGY ELLIPTA) 200-62.5-25 MCG/ACT AEPB Inhale 1 puff into the lungs daily. 09/24/23     gabapentin (NEURONTIN) 600 MG tablet Take 1 tablet (600 mg total) by mouth 3 (three) times daily. 12/31/23     glucose blood (FREESTYLE LITE) test strip Use as directed to test blood glucose levels three times daily. 10/16/21     hydrOXYzine (ATARAX) 25 MG tablet Take 1 tablet (25 mg total) by mouth every 6 (six) hours as needed for itching. 12/31/23     insulin glargine, 2 Unit Dial, (TOUJEO MAX SOLOSTAR) 300 UNIT/ML Solostar Pen Inject 112 Units into the skin daily in the afternoon. 01/31/24   Shamleffer, Konrad Dolores, MD  insulin lispro (HUMALOG KWIKPEN) 200 UNIT/ML KwikPen use as directed - maximum 80 units daily per scale 01/31/24   Shamleffer, Konrad Dolores, MD  Insulin Pen Needle (UNIFINE PENTIPS) 32G X 6 MM MISC Use in the morning, at noon, in the evening, and at bedtime as directed 01/31/24   Shamleffer, Konrad Dolores, MD  Lancets (FREESTYLE) lancets use for blood glucose testing three times daily 07/27/23     levocetirizine (XYZAL) 5 MG tablet Take 1 tablet (5 mg total)  by mouth daily. 12/31/23   Fleet Contras, MD  losartan (COZAAR) 25 MG tablet Take 1 tablet (25 mg total) by mouth daily. 01/31/24   Shamleffer, Konrad Dolores, MD  meloxicam (MOBIC) 15 MG tablet Take 1 tablet (15 mg total) by mouth daily with food 11/22/21     metFORMIN (GLUCOPHAGE-XR) 500 MG 24 hr tablet Take 1 tablet (500 mg total) by mouth 2 (two) times daily with a meal. 01/31/24   Shamleffer, Konrad Dolores, MD  oxyCODONE-acetaminophen (PERCOCET/ROXICET) 5-325 MG tablet Take 1 tablet by mouth every 6 (six) hours as needed for severe pain (pain score 7-10). 03/07/24   Lorre Nick, MD   promethazine (PHENERGAN) 25 MG tablet Take 1 tablet (25 mg total) by mouth 2 (two) times daily as needed. 04/01/23     rosuvastatin (CRESTOR) 40 MG tablet Take 1 tablet (40 mg total) by mouth daily. 03/01/23   Shamleffer, Konrad Dolores, MD  sertraline (ZOLOFT) 100 MG tablet Take 1 tablet (100 mg total) by mouth every evening. 11/21/23     tirzepatide (MOUNJARO) 10 MG/0.5ML Pen Inject 10 mg into the skin every 7 (seven) days. 01/31/24   Shamleffer, Konrad Dolores, MD  tiZANidine (ZANAFLEX) 4 MG tablet Take 1 tablet (4 mg total) by mouth 2 (two) times daily as needed. 12/23/23     tobramycin-dexamethasone (TOBRADEX) ophthalmic solution Shake liquid and place 2 drops in affected eye(s) every 6 hours as needed. 11/14/22     zolpidem (AMBIEN CR) 12.5 MG CR tablet Take 1 tablet (12.5 mg total) by mouth at bedtime as needed. 01/17/24         Allergies    Kiwi extract and Molds & smuts    Review of Systems   Review of Systems  Physical Exam Updated Vital Signs BP (!) 153/83 (BP Location: Right Arm)   Pulse (!) 112   Temp 98.3 F (36.8 C) (Oral)   Resp 20   Wt 118.4 kg   SpO2 94%   BMI 43.77 kg/m  Physical Exam Vitals reviewed.  Skin:    Comments: Beneath the right breast, and not involving the right breast, there is a small area of induration, mild fluctuance.  Erythema.  Bedside ultrasound revealed loculated appearing area.  No surrounding crepitus.  Neurological:     Mental Status: She is alert.     ED Results / Procedures / Treatments   Labs (all labs ordered are listed, but only abnormal results are displayed) Labs Reviewed  COMPREHENSIVE METABOLIC PANEL WITH GFR - Abnormal; Notable for the following components:      Result Value   Glucose, Bld 191 (*)    All other components within normal limits  CBC WITH DIFFERENTIAL/PLATELET - Abnormal; Notable for the following components:   Hemoglobin 11.3 (*)    HCT 34.7 (*)    All other components within normal limits   AEROBIC/ANAEROBIC CULTURE W GRAM STAIN (SURGICAL/DEEP WOUND)    EKG None  Radiology No results found.  Procedures .Incision and Drainage  Date/Time: 03/13/2024 4:21 PM  Performed by: Arletha Pili, DO Authorized by: Arletha Pili, DO   Consent:    Consent obtained:  Verbal   Consent given by:  Patient   Risks, benefits, and alternatives were discussed: yes     Risks discussed:  Bleeding and incomplete drainage Universal protocol:    Procedure explained and questions answered to patient or proxy's satisfaction: yes     Relevant documents present and verified: yes     Test results available :  yes     Imaging studies available: yes     Required blood products, implants, devices, and special equipment available: yes     Site/side marked: yes     Immediately prior to procedure, a time out was called: yes     Patient identity confirmed:  Verbally with patient Location:    Type:  Abscess   Size:  3.5 cm   Location: Right chest wall. Pre-procedure details:    Skin preparation:  Chlorhexidine Sedation:    Sedation type:  None Anesthesia:    Anesthesia method:  Local infiltration   Local anesthetic:  Lidocaine 1% w/o epi Procedure type:    Complexity:  Complex Procedure details:    Ultrasound guidance: yes     Needle aspiration: yes     Needle size:  18 G   Incision types:  Stab incision   Incision depth:  Subcutaneous   Wound management:  Probed and deloculated   Drainage:  Purulent and serosanguinous   Drainage amount:  Moderate   Wound treatment:  Wound left open   Packing materials:  None Post-procedure details:    Procedure completion:  Tolerated .Ultrasound ED Soft Tissue  Date/Time: 03/13/2024 4:26 PM  Performed by: Arletha Pili, DO Authorized by: Arletha Pili, DO   Procedure details:    Indications: localization of abscess     Transverse view:  Visualized   Longitudinal view:  Visualized   Images: archived   Location:    Location:  chest   Findings:     abscess present     Medications Ordered in ED Medications  ceFAZolin (ANCEF) IVPB 2g/100 mL premix (has no administration in time range)  oxyCODONE-acetaminophen (PERCOCET/ROXICET) 5-325 MG per tablet 1 tablet (has no administration in time range)  lidocaine (PF) (XYLOCAINE) 1 % injection 30 mL (30 mLs Infiltration Given 03/13/24 1551)  morphine (PF) 4 MG/ML injection 4 mg (4 mg Intravenous Given 03/13/24 1552)  ondansetron (ZOFRAN) injection 4 mg (4 mg Intravenous Given 03/13/24 1552)    ED Course/ Medical Decision Making/ A&P                                 Medical Decision Making 43 year old female here today with pain on the right chest wall.  Plan-patient with an abscess, visualized under ultrasound guidance and drained using a needle.  Wound culture sent.  Then used a scalpel to widen the incision.  Kelly clamp used to break down loculations.  Moderate amount of purulent fluid drained.  Reviewed patient's labs, no leukocytosis.  Nontoxic here in the ED.  Will give a dose of Ancef.  Will discharge patient with Bactrim.  Amount and/or Complexity of Data Reviewed Labs: ordered.  Risk Prescription drug management.           Final Clinical Impression(s) / ED Diagnoses Final diagnoses:  Chest wall abscess    Rx / DC Orders ED Discharge Orders          Ordered    sulfamethoxazole-trimethoprim (BACTRIM DS) 800-160 MG tablet  2 times daily        03/13/24 1625              Anders Simmonds T, DO 03/13/24 1628

## 2024-03-16 ENCOUNTER — Ambulatory Visit (HOSPITAL_COMMUNITY): Payer: Commercial Managed Care - PPO | Admitting: Family

## 2024-03-19 ENCOUNTER — Encounter: Payer: Self-pay | Admitting: Occupational Therapy

## 2024-03-19 ENCOUNTER — Ambulatory Visit: Payer: Self-pay | Admitting: Occupational Therapy

## 2024-03-19 DIAGNOSIS — R29898 Other symptoms and signs involving the musculoskeletal system: Secondary | ICD-10-CM

## 2024-03-19 DIAGNOSIS — R208 Other disturbances of skin sensation: Secondary | ICD-10-CM

## 2024-03-19 DIAGNOSIS — M6281 Muscle weakness (generalized): Secondary | ICD-10-CM

## 2024-03-19 DIAGNOSIS — R278 Other lack of coordination: Secondary | ICD-10-CM

## 2024-03-19 LAB — AEROBIC/ANAEROBIC CULTURE W GRAM STAIN (SURGICAL/DEEP WOUND)

## 2024-03-19 NOTE — Therapy (Signed)
 [facility] [address] [place], [place], [address] Phone: [phone]   Fax:  [phone]  Patient Details  Name: Sharon Gregory MRN: [id number] Date of Birth: 17-Jun-1981  OCCUPATIONAL THERAPY DISCHARGE SUMMARY  Visits from Start of Care: 12  Phone call Pt contacted clinic and requested to D/C therapy at this time d/t other ongoing medical concerns. Pt reported intent to reach back out with a new referral when pt is ready to come back.  Current functional level related to goals / functional outcomes: Pt met 100% of initial STG and LTG. Updated LTG were added on 02/27/24. However, OT unable to assess progress towards updated LTG d/t pt not returning since 02/27/24 OT visit secondary to changes in medical status.  Remaining deficits: Ongoing impairments, see tx notes for additional details.   Education / Equipment: Pt has some needed materials and education. See tx notes for more details.    Patient goals were partially met. Patient is being discharged due to the patient's request and d/t change in medical status.   If additional OT services are recommended, a new OT referral will be required.     Wynetta Emery, OT 03/19/2024, 4:38 PM  [facility] [address] [place], [place], [address] Phone: [phone]   Fax:  [phone]

## 2024-03-20 ENCOUNTER — Telehealth (HOSPITAL_BASED_OUTPATIENT_CLINIC_OR_DEPARTMENT_OTHER): Payer: Self-pay

## 2024-03-20 ENCOUNTER — Other Ambulatory Visit (HOSPITAL_COMMUNITY): Payer: Self-pay

## 2024-03-20 MED ORDER — AMOXICILLIN 500 MG PO CAPS
500.0000 mg | ORAL_CAPSULE | Freq: Three times a day (TID) | ORAL | 0 refills | Status: DC
  Filled 2024-03-20: qty 21, 7d supply, fill #0

## 2024-03-20 NOTE — Telephone Encounter (Signed)
 Post ED Visit - Positive Culture Follow-up: Successful Patient Follow-Up  Culture assessed and recommendations reviewed by:  [x]  Lora Paula, Pharm.D. []  Celedonio Miyamoto, Pharm.D., BCPS AQ-ID []  Garvin Fila, Pharm.D., BCPS []  Georgina Pillion, Pharm.D., BCPS []  [place], [address].D., BCPS, AAHIVP []  Estella Husk, Pharm.D., BCPS, AAHIVP []  Lysle Pearl, PharmD, BCPS []  Phillips Climes, PharmD, BCPS []  Agapito Games, PharmD, BCPS []  Verlan Friends, PharmD  Positive Wound culture  []  Patient discharged without antimicrobial prescription and treatment is now indicated [x]  Organism is resistant to prescribed ED discharge antimicrobial []  Patient with positive blood cultures  Plan: call pt if abscess site is healing well without pain or significant swelling, stop Bactrim, no need for abx.  If persistently painful, swollen, etc. Stop Bactrim and start Amoxicillin 500 mg po 3 times daily x 7 days. Spoke with pt, she states it is still firm and painful to touch. Pt instructed to stop taking Bactrim and to start Amoxicillin as ordered.   Changes discussed with ED provider: Jasmine Pang, PA-C New antibiotic prescription Amoxicillin 500 mg po 3 times daily x 7 days  Called to [facility]  Contacted patient, date 03/20/2024, time 2:20 pm   Sandria Senter 03/20/2024, 2:22 PM

## 2024-03-23 ENCOUNTER — Other Ambulatory Visit (HOSPITAL_COMMUNITY): Payer: Self-pay

## 2024-03-23 ENCOUNTER — Other Ambulatory Visit: Payer: Self-pay

## 2024-03-26 ENCOUNTER — Other Ambulatory Visit (HOSPITAL_COMMUNITY): Payer: Self-pay

## 2024-03-26 ENCOUNTER — Encounter: Payer: Self-pay | Admitting: Occupational Therapy

## 2024-03-26 ENCOUNTER — Other Ambulatory Visit: Payer: Self-pay

## 2024-03-26 ENCOUNTER — Ambulatory Visit: Admitting: Adult Health

## 2024-03-26 ENCOUNTER — Encounter: Payer: Self-pay | Admitting: Adult Health

## 2024-03-26 VITALS — BP 128/84 | HR 98 | Ht 64.75 in | Wt 259.0 lb

## 2024-03-26 DIAGNOSIS — F411 Generalized anxiety disorder: Secondary | ICD-10-CM | POA: Diagnosis not present

## 2024-03-26 DIAGNOSIS — F0630 Mood disorder due to known physiological condition, unspecified: Secondary | ICD-10-CM

## 2024-03-26 DIAGNOSIS — F99 Mental disorder, not otherwise specified: Secondary | ICD-10-CM

## 2024-03-26 DIAGNOSIS — F5105 Insomnia due to other mental disorder: Secondary | ICD-10-CM

## 2024-03-26 MED ORDER — TRAZODONE HCL 50 MG PO TABS
ORAL_TABLET | ORAL | 2 refills | Status: DC
  Filled 2024-03-26: qty 60, 30d supply, fill #0
  Filled 2024-04-20: qty 60, 30d supply, fill #1
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
## 2024-03-28 ENCOUNTER — Other Ambulatory Visit (HOSPITAL_COMMUNITY): Payer: Self-pay

## 2024-03-30 ENCOUNTER — Other Ambulatory Visit (HOSPITAL_COMMUNITY): Payer: Self-pay

## 2024-03-30 ENCOUNTER — Other Ambulatory Visit: Payer: Self-pay

## 2024-04-02 ENCOUNTER — Other Ambulatory Visit: Payer: Self-pay | Admitting: Internal Medicine

## 2024-04-02 DIAGNOSIS — E785 Hyperlipidemia, unspecified: Secondary | ICD-10-CM

## 2024-04-03 ENCOUNTER — Other Ambulatory Visit: Payer: Self-pay

## 2024-04-03 ENCOUNTER — Other Ambulatory Visit (HOSPITAL_COMMUNITY): Payer: Self-pay

## 2024-04-03 MED ORDER — ROSUVASTATIN CALCIUM 40 MG PO TABS
40.0000 mg | ORAL_TABLET | Freq: Every day | ORAL | 1 refills | Status: DC
  Filled 2024-04-03: qty 90, 90d supply, fill #0
  Filled 2024-07-03: qty 90, 90d supply, fill #1

## 2024-04-10 ENCOUNTER — Other Ambulatory Visit (HOSPITAL_COMMUNITY): Payer: Self-pay

## 2024-04-10 ENCOUNTER — Other Ambulatory Visit: Payer: Self-pay

## 2024-04-13 ENCOUNTER — Other Ambulatory Visit (HOSPITAL_COMMUNITY): Payer: Self-pay

## 2024-04-13 ENCOUNTER — Other Ambulatory Visit: Payer: Self-pay

## 2024-04-15 ENCOUNTER — Other Ambulatory Visit: Payer: Self-pay

## 2024-04-15 ENCOUNTER — Other Ambulatory Visit (HOSPITAL_COMMUNITY): Payer: Self-pay

## 2024-04-20 ENCOUNTER — Other Ambulatory Visit (HOSPITAL_COMMUNITY): Payer: Self-pay

## 2024-04-28 ENCOUNTER — Encounter: Payer: Self-pay | Admitting: Adult Health

## 2024-04-28 ENCOUNTER — Other Ambulatory Visit (HOSPITAL_COMMUNITY): Payer: Self-pay

## 2024-04-28 ENCOUNTER — Telehealth (INDEPENDENT_AMBULATORY_CARE_PROVIDER_SITE_OTHER): Admitting: Adult Health

## 2024-04-28 DIAGNOSIS — F39 Unspecified mood [affective] disorder: Secondary | ICD-10-CM | POA: Diagnosis not present

## 2024-04-28 DIAGNOSIS — F5105 Insomnia due to other mental disorder: Secondary | ICD-10-CM | POA: Diagnosis not present

## 2024-04-28 DIAGNOSIS — F0630 Mood disorder due to known physiological condition, unspecified: Secondary | ICD-10-CM

## 2024-04-28 DIAGNOSIS — F411 Generalized anxiety disorder: Secondary | ICD-10-CM

## 2024-04-28 DIAGNOSIS — F99 Mental disorder, not otherwise specified: Secondary | ICD-10-CM

## 2024-04-28 MED ORDER — OLANZAPINE 5 MG PO TABS
5.0000 mg | ORAL_TABLET | Freq: Every day | ORAL | 2 refills | Status: DC
  Filled 2024-04-28 (×2): qty 30, 30d supply, fill #0
  Filled 2024-05-24: qty 30, 30d supply, fill #1
  Filled 2024-06-23: qty 30, 30d supply, fill #2
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
## 2024-04-29 ENCOUNTER — Other Ambulatory Visit (HOSPITAL_COMMUNITY): Payer: Self-pay

## 2024-05-06 ENCOUNTER — Telehealth: Payer: Self-pay | Admitting: Adult Health

## 2024-05-06 NOTE — Telephone Encounter (Signed)
FYI - Please see message from patient.  

## 2024-05-06 NOTE — Telephone Encounter (Signed)
 Pt called at 9:35a.  She had appt with Bonnell Butcher last Tuesday.  She started taking Olanzapine last Wed.  She said she could tell a difference after the first night or two but she wanted to give it more time. She said to let Bonnell Butcher know she feels like "they are on to something" and she is thankful to her.  She does not need a call back, she was just told to call back and advise Bonnell Butcher how it was working.

## 2024-05-11 DIAGNOSIS — S62617A Displaced fracture of proximal phalanx of left little finger, initial encounter for closed fracture: Secondary | ICD-10-CM | POA: Diagnosis not present

## 2024-05-12 ENCOUNTER — Telehealth: Payer: Self-pay | Admitting: Adult Health

## 2024-05-12 NOTE — Telephone Encounter (Signed)
 Sharon Gregory called stating that the Olanzapine  is making her feet and ankles swell. States her feet are puffy now. She noticed this in a couple of days after taking it. Does she need to come in? Please call her at [phone] or home #[phone].  [facility]   Phone: [phone]  Fax: [phone]

## 2024-05-14 ENCOUNTER — Other Ambulatory Visit (HOSPITAL_COMMUNITY): Payer: Self-pay | Admitting: Orthopedic Surgery

## 2024-05-14 DIAGNOSIS — S62617A Displaced fracture of proximal phalanx of left little finger, initial encounter for closed fracture: Secondary | ICD-10-CM

## 2024-05-16 ENCOUNTER — Ambulatory Visit (HOSPITAL_COMMUNITY)
Admission: RE | Admit: 2024-05-16 | Discharge: 2024-05-16 | Disposition: A | Discharge status: Home / Self Care | Source: Ambulatory Visit | Attending: Orthopedic Surgery | Admitting: Orthopedic Surgery

## 2024-05-16 ENCOUNTER — Other Ambulatory Visit (HOSPITAL_COMMUNITY): Payer: Self-pay

## 2024-05-16 DIAGNOSIS — S62617A Displaced fracture of proximal phalanx of left little finger, initial encounter for closed fracture: Secondary | ICD-10-CM | POA: Insufficient documentation

## 2024-05-16 DIAGNOSIS — Z0389 Encounter for observation for other suspected diseases and conditions ruled out: Secondary | ICD-10-CM | POA: Diagnosis not present

## 2024-05-18 ENCOUNTER — Other Ambulatory Visit (HOSPITAL_COMMUNITY): Payer: Self-pay

## 2024-05-18 MED ORDER — SERTRALINE HCL 100 MG PO TABS
100.0000 mg | ORAL_TABLET | Freq: Every evening | ORAL | 5 refills | Status: DC
  Filled 2024-05-18: qty 30, 30d supply, fill #0
  Filled 2024-06-17: qty 30, 30d supply, fill #1
  Filled 2024-07-20: qty 30, 30d supply, fill #2
  Filled 2024-08-17: qty 30, 30d supply, fill #3
  Filled 2024-09-13: qty 30, 30d supply, fill #4
  Filled 2024-10-22: qty 30, 30d supply, fill #5

## 2024-05-19 ENCOUNTER — Other Ambulatory Visit: Payer: Self-pay

## 2024-05-19 ENCOUNTER — Other Ambulatory Visit (HOSPITAL_COMMUNITY): Payer: Self-pay

## 2024-05-25 ENCOUNTER — Other Ambulatory Visit (HOSPITAL_COMMUNITY): Payer: Self-pay

## 2024-05-29 ENCOUNTER — Other Ambulatory Visit (HOSPITAL_COMMUNITY): Payer: Self-pay

## 2024-06-04 ENCOUNTER — Other Ambulatory Visit: Payer: Self-pay

## 2024-06-04 ENCOUNTER — Other Ambulatory Visit (HOSPITAL_COMMUNITY): Payer: Self-pay

## 2024-06-08 DIAGNOSIS — S62617P Displaced fracture of proximal phalanx of left little finger, subsequent encounter for fracture with malunion: Secondary | ICD-10-CM | POA: Diagnosis not present

## 2024-06-10 ENCOUNTER — Telehealth: Payer: Self-pay | Admitting: Adult Health

## 2024-06-10 NOTE — Telephone Encounter (Signed)
 Patient called in regarding Olanzapine  5mg  states that medication is working. However, it seems to not work as well as it did when she first began taking medication. She would like a rtc to discuss. Ph: [phone]

## 2024-06-10 NOTE — Telephone Encounter (Signed)
 Pt seen 5/20 and started on olanzapine  5 mg for sleep and racing thoughts. She said initially the medication worked really well, but reports medication does not seem to be working as well. Reported when she would get up to RR during the night she could go right back to sleep. Reporting now that it can take 1-1/2 hours for her to get back to sleep and the racing thoughts hit full force. No reported SE.

## 2024-06-15 ENCOUNTER — Other Ambulatory Visit: Payer: Self-pay | Admitting: Orthopedic Surgery

## 2024-06-18 ENCOUNTER — Other Ambulatory Visit: Payer: Self-pay

## 2024-06-23 ENCOUNTER — Other Ambulatory Visit (HOSPITAL_COMMUNITY): Payer: Self-pay

## 2024-06-24 ENCOUNTER — Other Ambulatory Visit (HOSPITAL_COMMUNITY): Payer: Self-pay

## 2024-06-25 ENCOUNTER — Encounter: Payer: Self-pay | Admitting: Psychology

## 2024-06-25 ENCOUNTER — Ambulatory Visit (INDEPENDENT_AMBULATORY_CARE_PROVIDER_SITE_OTHER): Admitting: Psychology

## 2024-06-25 DIAGNOSIS — F4321 Adjustment disorder with depressed mood: Secondary | ICD-10-CM | POA: Diagnosis not present

## 2024-06-25 DIAGNOSIS — F411 Generalized anxiety disorder: Secondary | ICD-10-CM | POA: Diagnosis not present

## 2024-06-25 NOTE — Progress Notes (Signed)
   Sharon Dames, PhD
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
## 2024-06-29 ENCOUNTER — Other Ambulatory Visit: Payer: Self-pay

## 2024-06-29 ENCOUNTER — Other Ambulatory Visit (HOSPITAL_COMMUNITY): Payer: Self-pay

## 2024-06-29 MED ORDER — TIZANIDINE HCL 4 MG PO TABS
4.0000 mg | ORAL_TABLET | Freq: Two times a day (BID) | ORAL | 1 refills | Status: AC | PRN
  Filled 2024-06-29: qty 180, 90d supply, fill #0
  Filled 2024-11-01: qty 180, 90d supply, fill #1

## 2024-06-30 ENCOUNTER — Other Ambulatory Visit (HOSPITAL_COMMUNITY): Payer: Self-pay

## 2024-07-04 ENCOUNTER — Other Ambulatory Visit (HOSPITAL_COMMUNITY): Payer: Self-pay

## 2024-07-08 ENCOUNTER — Other Ambulatory Visit: Payer: Self-pay | Admitting: Adult Health

## 2024-07-08 ENCOUNTER — Other Ambulatory Visit (HOSPITAL_COMMUNITY): Payer: Self-pay

## 2024-07-08 DIAGNOSIS — F0630 Mood disorder due to known physiological condition, unspecified: Secondary | ICD-10-CM

## 2024-07-13 ENCOUNTER — Other Ambulatory Visit (HOSPITAL_COMMUNITY): Payer: Self-pay

## 2024-07-14 ENCOUNTER — Encounter: Payer: Self-pay | Admitting: Psychology

## 2024-07-14 ENCOUNTER — Ambulatory Visit (INDEPENDENT_AMBULATORY_CARE_PROVIDER_SITE_OTHER): Admitting: Psychology

## 2024-07-14 ENCOUNTER — Other Ambulatory Visit: Payer: Self-pay

## 2024-07-14 DIAGNOSIS — F411 Generalized anxiety disorder: Secondary | ICD-10-CM | POA: Diagnosis not present

## 2024-07-14 DIAGNOSIS — F4321 Adjustment disorder with depressed mood: Secondary | ICD-10-CM

## 2024-07-14 DIAGNOSIS — F4320 Adjustment disorder, unspecified: Secondary | ICD-10-CM
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
## 2024-07-14 NOTE — Progress Notes (Signed)
   Sharon Dames, PhD

## 2024-07-16 ENCOUNTER — Other Ambulatory Visit: Admitting: Psychology

## 2024-07-16 ENCOUNTER — Encounter: Payer: Self-pay | Admitting: Psychology

## 2024-07-16 NOTE — Progress Notes (Signed)
 Sharon Gregory is a 43 y.o. female patient Report writing competed ( 3 hrs.).  Interactive feedback to be conducted next session. Report to be attached to the feedback progress note.  Patient/Guardian was advised Release of Information must be obtained prior to any record release in order to collaborate their care with an outside provider. Patient/Guardian was advised if they have not already done so to contact the registration department to sign all necessary forms in order for us  to release information regarding their care.   Consent: Patient/Guardian gives verbal consent for treatment and assignment of benefits for services provided during this visit. Patient/Guardian expressed understanding and agreed to proceed.    Vere Diantonio, PhD

## 2024-07-20 ENCOUNTER — Other Ambulatory Visit (HOSPITAL_COMMUNITY): Payer: Self-pay

## 2024-07-20 ENCOUNTER — Other Ambulatory Visit: Payer: Self-pay | Admitting: Adult Health

## 2024-07-20 ENCOUNTER — Other Ambulatory Visit (HOSPITAL_BASED_OUTPATIENT_CLINIC_OR_DEPARTMENT_OTHER): Payer: Self-pay

## 2024-07-20 ENCOUNTER — Other Ambulatory Visit: Payer: Self-pay

## 2024-07-20 DIAGNOSIS — F0630 Mood disorder due to known physiological condition, unspecified: Secondary | ICD-10-CM

## 2024-07-20 MED ORDER — OLANZAPINE 5 MG PO TABS
5.0000 mg | ORAL_TABLET | Freq: Every day | ORAL | 0 refills | Status: DC
  Filled 2024-07-20: qty 30, 30d supply, fill #0

## 2024-07-20 NOTE — Telephone Encounter (Signed)
 Pt is scheduled  for 07/24/24. Please send script in

## 2024-07-22 ENCOUNTER — Encounter: Payer: Self-pay | Admitting: Psychology

## 2024-07-22 ENCOUNTER — Ambulatory Visit: Admitting: Psychology

## 2024-07-22 DIAGNOSIS — F4310 Post-traumatic stress disorder, unspecified: Secondary | ICD-10-CM

## 2024-07-22 DIAGNOSIS — F422 Mixed obsessional thoughts and acts: Secondary | ICD-10-CM

## 2024-07-22 DIAGNOSIS — F429 Obsessive-compulsive disorder, unspecified: Secondary | ICD-10-CM | POA: Diagnosis not present

## 2024-07-22 DIAGNOSIS — S060X0S Concussion without loss of consciousness, sequela: Secondary | ICD-10-CM | POA: Diagnosis not present

## 2024-07-22 DIAGNOSIS — F331 Major depressive disorder, recurrent, moderate: Secondary | ICD-10-CM | POA: Diagnosis not present

## 2024-07-22 NOTE — Progress Notes (Signed)
   Sharon Dames, PhD
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
## 2024-07-24 ENCOUNTER — Other Ambulatory Visit (HOSPITAL_COMMUNITY): Payer: Self-pay

## 2024-07-24 ENCOUNTER — Encounter: Payer: Self-pay | Admitting: Adult Health

## 2024-07-24 ENCOUNTER — Telehealth (INDEPENDENT_AMBULATORY_CARE_PROVIDER_SITE_OTHER): Admitting: Adult Health

## 2024-07-24 DIAGNOSIS — F319 Bipolar disorder, unspecified: Secondary | ICD-10-CM

## 2024-07-24 DIAGNOSIS — F4310 Post-traumatic stress disorder, unspecified: Secondary | ICD-10-CM

## 2024-07-24 DIAGNOSIS — G3184 Mild cognitive impairment, so stated: Secondary | ICD-10-CM | POA: Diagnosis not present

## 2024-07-24 DIAGNOSIS — F429 Obsessive-compulsive disorder, unspecified: Secondary | ICD-10-CM | POA: Diagnosis not present

## 2024-07-24 DIAGNOSIS — F331 Major depressive disorder, recurrent, moderate: Secondary | ICD-10-CM

## 2024-07-24 MED ORDER — OLANZAPINE 10 MG PO TABS
10.0000 mg | ORAL_TABLET | Freq: Every day | ORAL | 1 refills | Status: DC
Start: 2024-07-24 — End: 2024-09-20
  Filled 2024-07-24: qty 30, 30d supply, fill #0
  Filled 2024-08-23: qty 30, 30d supply, fill #1

## 2024-07-24 MED ORDER — OLANZAPINE 10 MG PO TABS
10.0000 mg | ORAL_TABLET | Freq: Every day | ORAL | 1 refills | Status: DC
  Filled 2024-07-24: qty 30, 30d supply, fill #0
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
## 2024-08-14 ENCOUNTER — Other Ambulatory Visit: Payer: Self-pay

## 2024-08-14 ENCOUNTER — Encounter (HOSPITAL_BASED_OUTPATIENT_CLINIC_OR_DEPARTMENT_OTHER): Payer: Self-pay | Admitting: Orthopedic Surgery

## 2024-08-17 ENCOUNTER — Other Ambulatory Visit: Payer: Self-pay | Admitting: Internal Medicine

## 2024-08-18 ENCOUNTER — Other Ambulatory Visit: Payer: Self-pay

## 2024-08-18 ENCOUNTER — Other Ambulatory Visit (HOSPITAL_COMMUNITY): Payer: Self-pay

## 2024-08-18 ENCOUNTER — Encounter (HOSPITAL_BASED_OUTPATIENT_CLINIC_OR_DEPARTMENT_OTHER)
Admission: RE | Admit: 2024-08-18 | Discharge: 2024-08-18 | Disposition: A | Discharge status: Home / Self Care | Source: Ambulatory Visit | Attending: Orthopedic Surgery | Admitting: Orthopedic Surgery

## 2024-08-18 DIAGNOSIS — Z01818 Encounter for other preprocedural examination: Secondary | ICD-10-CM | POA: Diagnosis not present

## 2024-08-18 LAB — BASIC METABOLIC PANEL WITH GFR
Anion gap: 13 (ref 5–15)
BUN: 14 mg/dL (ref 6–20)
CO2: 25 mmol/L (ref 22–32)
Calcium: 9.4 mg/dL (ref 8.9–10.3)
Chloride: 100 mmol/L (ref 98–111)
Creatinine, Ser: 0.81 mg/dL (ref 0.44–1.00)
GFR, Estimated: >60 mL/min (ref >60)
Glucose, Bld: 166 mg/dL — ABNORMAL HIGH (ref 70–99)
Potassium: 4 mmol/L (ref 3.5–5.1)
Sodium: 138 mmol/L (ref 135–145)

## 2024-08-18 MED ORDER — MOUNJARO 10 MG/0.5ML ~~LOC~~ SOAJ
10.0000 mg | SUBCUTANEOUS | 2 refills | Status: DC
  Filled 2024-08-18: qty 4, 56d supply, fill #0

## 2024-08-18 NOTE — Progress Notes (Signed)

## 2024-08-19 ENCOUNTER — Other Ambulatory Visit: Payer: Self-pay

## 2024-08-20 ENCOUNTER — Other Ambulatory Visit (HOSPITAL_COMMUNITY): Payer: Self-pay

## 2024-08-20 NOTE — Anesthesia Preprocedure Evaluation (Signed)
 Anesthesia Evaluation  Patient identified by MRN, date of birth, ID band Patient awake    Reviewed: Allergy  & Precautions, NPO status , Patient's Chart, lab work & pertinent test results  History of Anesthesia Complications (+) history of anesthetic complications  Airway Mallampati: II  TM Distance: >3 FB Neck ROM: Full    Dental no notable dental hx. (+) Teeth Intact, Dental Advisory Given   Pulmonary asthma , sleep apnea , former smoker   Pulmonary exam normal breath sounds clear to auscultation       Cardiovascular (-) hypertension(-) angina (-) Past MI Normal cardiovascular exam Rhythm:Regular Rate:Normal     Neuro/Psych  Headaches PSYCHIATRIC DISORDERS Anxiety        GI/Hepatic ,GERD  ,,  Endo/Other  diabetes, Well Controlled, Type 2  Class 3 obesity  Renal/GU      Musculoskeletal  (+) Arthritis ,    Abdominal   Peds  Hematology   Anesthesia Other Findings   Reproductive/Obstetrics                              Anesthesia Physical Anesthesia Plan  ASA: 3  Anesthesia Plan: Regional and MAC   Post-op Pain Management: Regional block* and Tylenol  PO (pre-op)*   Induction:   PONV Risk Score and Plan: Treatment may vary due to age or medical condition, Propofol  infusion and Midazolam   Airway Management Planned: Natural Airway and Nasal Cannula  Additional Equipment: None  Intra-op Plan:   Post-operative Plan:   Informed Consent: I have reviewed the patients History and Physical, chart, labs and discussed the procedure including the risks, benefits and alternatives for the proposed anesthesia with the patient or authorized representative who has indicated his/her understanding and acceptance.     Dental advisory given  Plan Discussed with: CRNA and Surgeon  Anesthesia Plan Comments: (L supraclavicular block)         Anesthesia Quick Evaluation

## 2024-08-21 ENCOUNTER — Ambulatory Visit (HOSPITAL_BASED_OUTPATIENT_CLINIC_OR_DEPARTMENT_OTHER): Payer: Self-pay | Admitting: Anesthesiology

## 2024-08-21 ENCOUNTER — Ambulatory Visit (HOSPITAL_BASED_OUTPATIENT_CLINIC_OR_DEPARTMENT_OTHER)
Admission: RE | Admit: 2024-08-21 | Discharge: 2024-08-21 | Disposition: A | Discharge status: Home / Self Care | Attending: Orthopedic Surgery | Admitting: Orthopedic Surgery

## 2024-08-21 ENCOUNTER — Encounter (HOSPITAL_BASED_OUTPATIENT_CLINIC_OR_DEPARTMENT_OTHER)
Admission: RE | Disposition: A | Discharge status: Home / Self Care | Payer: Self-pay | Source: Home / Self Care | Attending: Orthopedic Surgery

## 2024-08-21 ENCOUNTER — Encounter (HOSPITAL_BASED_OUTPATIENT_CLINIC_OR_DEPARTMENT_OTHER): Payer: Self-pay | Admitting: Orthopedic Surgery

## 2024-08-21 ENCOUNTER — Ambulatory Visit (HOSPITAL_BASED_OUTPATIENT_CLINIC_OR_DEPARTMENT_OTHER)

## 2024-08-21 ENCOUNTER — Other Ambulatory Visit: Payer: Self-pay

## 2024-08-21 DIAGNOSIS — Z6841 Body Mass Index (BMI) 40.0 and over, adult: Secondary | ICD-10-CM | POA: Insufficient documentation

## 2024-08-21 DIAGNOSIS — K219 Gastro-esophageal reflux disease without esophagitis: Secondary | ICD-10-CM | POA: Insufficient documentation

## 2024-08-21 DIAGNOSIS — E66813 Obesity, class 3: Secondary | ICD-10-CM | POA: Insufficient documentation

## 2024-08-21 DIAGNOSIS — M1990 Unspecified osteoarthritis, unspecified site: Secondary | ICD-10-CM | POA: Insufficient documentation

## 2024-08-21 DIAGNOSIS — Z833 Family history of diabetes mellitus: Secondary | ICD-10-CM | POA: Diagnosis not present

## 2024-08-21 DIAGNOSIS — J449 Chronic obstructive pulmonary disease, unspecified: Secondary | ICD-10-CM | POA: Diagnosis not present

## 2024-08-21 DIAGNOSIS — Z87891 Personal history of nicotine dependence: Secondary | ICD-10-CM | POA: Insufficient documentation

## 2024-08-21 DIAGNOSIS — Z794 Long term (current) use of insulin: Secondary | ICD-10-CM | POA: Diagnosis not present

## 2024-08-21 DIAGNOSIS — S62617K Displaced fracture of proximal phalanx of left little finger, subsequent encounter for fracture with nonunion: Secondary | ICD-10-CM | POA: Diagnosis not present

## 2024-08-21 DIAGNOSIS — G4730 Sleep apnea, unspecified: Secondary | ICD-10-CM | POA: Diagnosis not present

## 2024-08-21 DIAGNOSIS — J4489 Other specified chronic obstructive pulmonary disease: Secondary | ICD-10-CM | POA: Diagnosis not present

## 2024-08-21 DIAGNOSIS — S62617P Displaced fracture of proximal phalanx of left little finger, subsequent encounter for fracture with malunion: Secondary | ICD-10-CM | POA: Diagnosis not present

## 2024-08-21 DIAGNOSIS — X58XXXD Exposure to other specified factors, subsequent encounter: Secondary | ICD-10-CM | POA: Insufficient documentation

## 2024-08-21 DIAGNOSIS — E119 Type 2 diabetes mellitus without complications: Secondary | ICD-10-CM

## 2024-08-21 LAB — GLUCOSE, CAPILLARY
Glucose-Capillary: 120 mg/dL — ABNORMAL HIGH (ref 70–99)
Glucose-Capillary: 80 mg/dL (ref 70–99)

## 2024-08-21 SURGERY — OSTEOTOMY, METACARPAL BONE
Anesthesia: Monitor Anesthesia Care | Site: Little Finger | Laterality: Left

## 2024-08-21 MED ORDER — MIDAZOLAM HCL 2 MG/2ML IJ SOLN
2.0000 mg | Freq: Once | INTRAMUSCULAR | Status: AC
  Administered 2024-08-21: 2 mg via INTRAVENOUS

## 2024-08-21 MED ORDER — FENTANYL CITRATE (PF) 100 MCG/2ML IJ SOLN
INTRAMUSCULAR | Status: AC
  Filled 2024-08-21: qty 2

## 2024-08-21 MED ORDER — MIDAZOLAM HCL 2 MG/2ML IJ SOLN
INTRAMUSCULAR | Status: AC
  Filled 2024-08-21: qty 2

## 2024-08-21 MED ORDER — NALOXONE HCL 4 MG/0.1ML NA LIQD: 1.0000 | Freq: Once | NASAL | 0 refills | Status: AC

## 2024-08-21 MED ORDER — FENTANYL CITRATE (PF) 100 MCG/2ML IJ SOLN: 25.0000 ug | INTRAMUSCULAR | Status: DC | PRN

## 2024-08-21 MED ORDER — OXYCODONE HCL 5 MG PO TABS: 5.0000 mg | ORAL_TABLET | Freq: Once | ORAL | Status: DC | PRN

## 2024-08-21 MED ORDER — 0.9 % SODIUM CHLORIDE (POUR BTL) OPTIME
TOPICAL | Status: DC | PRN
Start: 2024-08-21 — End: 2024-08-21
  Administered 2024-08-21: 1000 mL

## 2024-08-21 MED ORDER — FENTANYL CITRATE (PF) 100 MCG/2ML IJ SOLN
50.0000 ug | Freq: Once | INTRAMUSCULAR | Status: AC
  Administered 2024-08-21: 50 ug via INTRAVENOUS

## 2024-08-21 MED ORDER — AMISULPRIDE (ANTIEMETIC) 5 MG/2ML IV SOLN
INTRAVENOUS | Status: AC
  Filled 2024-08-21: qty 4

## 2024-08-21 MED ORDER — MIDAZOLAM HCL 2 MG/2ML IJ SOLN
INTRAMUSCULAR | Status: DC | PRN
  Administered 2024-08-21: 1 mg via INTRAVENOUS

## 2024-08-21 MED ORDER — LACTATED RINGERS IV SOLN: INTRAVENOUS | Status: DC

## 2024-08-21 MED ORDER — AMISULPRIDE (ANTIEMETIC) 5 MG/2ML IV SOLN
10.0000 mg | Freq: Once | INTRAVENOUS | Status: AC | PRN
  Administered 2024-08-21: 10 mg via INTRAVENOUS

## 2024-08-21 MED ORDER — ACETAMINOPHEN 10 MG/ML IV SOLN: 1000.0000 mg | Freq: Once | INTRAVENOUS | Status: DC | PRN

## 2024-08-21 MED ORDER — CEFAZOLIN SODIUM-DEXTROSE 2-4 GM/100ML-% IV SOLN
2.0000 g | INTRAVENOUS | Status: AC
  Administered 2024-08-21: 3 g via INTRAVENOUS

## 2024-08-21 MED ORDER — BUPIVACAINE HCL (PF) 0.5 % IJ SOLN
INTRAMUSCULAR | Status: DC | PRN
  Administered 2024-08-21: 30 mL via PERINEURAL

## 2024-08-21 MED ORDER — PROPOFOL 10 MG/ML IV BOLUS
INTRAVENOUS | Status: DC | PRN
  Administered 2024-08-21: 50 ug/kg/min via INTRAVENOUS

## 2024-08-21 MED ORDER — ONDANSETRON HCL 4 MG/2ML IJ SOLN
INTRAMUSCULAR | Status: AC
Start: 2024-08-21 — End: 2024-08-21
  Filled 2024-08-21: qty 2

## 2024-08-21 MED ORDER — ONDANSETRON HCL 4 MG/2ML IJ SOLN
4.0000 mg | Freq: Once | INTRAMUSCULAR | Status: AC | PRN
  Administered 2024-08-21: 4 mg via INTRAVENOUS

## 2024-08-21 MED ORDER — OXYCODONE HCL 5 MG PO TABS: 5.0000 mg | ORAL_TABLET | ORAL | 0 refills | Status: AC | PRN

## 2024-08-21 MED ORDER — CEFAZOLIN SODIUM-DEXTROSE 3-4 GM/150ML-% IV SOLN
INTRAVENOUS | Status: AC
  Filled 2024-08-21: qty 150

## 2024-08-21 MED ORDER — OXYCODONE HCL 5 MG/5ML PO SOLN: 5.0000 mg | Freq: Once | ORAL | Status: DC | PRN

## 2024-08-21 SURGICAL SUPPLY — 59 items
BENZOIN TINCTURE PRP APPL 2/3 (GAUZE/BANDAGES/DRESSINGS) IMPLANT
BIT DRILL 1.1 K-WIRE TH 70 (BIT) IMPLANT
BLADE MINI RND TIP GREEN BEAV (BLADE) IMPLANT
BLADE SURG 15 STRL LF DISP TIS (BLADE) ×2 IMPLANT
BNDG COMPR ESMARK 4X3 LF (GAUZE/BANDAGES/DRESSINGS) ×1 IMPLANT
BNDG ELASTIC 2INX 5YD STR LF (GAUZE/BANDAGES/DRESSINGS) IMPLANT
BNDG ELASTIC 3INX 5YD STR LF (GAUZE/BANDAGES/DRESSINGS) ×1 IMPLANT
BNDG GAUZE DERMACEA FLUFF 4 (GAUZE/BANDAGES/DRESSINGS) IMPLANT
CHLORAPREP W/TINT 26 (MISCELLANEOUS) ×1 IMPLANT
CLSR STERI-STRIP ANTIMIC 1/2X4 (GAUZE/BANDAGES/DRESSINGS) IMPLANT
CORD BIPOLAR FORCEPS 12FT (ELECTRODE) ×1 IMPLANT
COVER BACK TABLE 60X90IN (DRAPES) ×1 IMPLANT
CUFF TOURN SGL QUICK 18X4 (TOURNIQUET CUFF) ×1 IMPLANT
DRAPE HAND 77X146 (DRAPES) ×1 IMPLANT
DRAPE IMP U-DRAPE 54X76 (DRAPES) ×1 IMPLANT
DRAPE OEC MINIVIEW 54X84 (DRAPES) ×1 IMPLANT
DRAPE SURG 17X23 STRL (DRAPES) ×1 IMPLANT
GAUZE 4X4 16PLY ~~LOC~~+RFID DBL (SPONGE) IMPLANT
GAUZE PAD ABD 8X10 STRL (GAUZE/BANDAGES/DRESSINGS) IMPLANT
GAUZE SPONGE 4X4 12PLY STRL (GAUZE/BANDAGES/DRESSINGS) ×1 IMPLANT
GAUZE STRETCH 2X75IN STRL (MISCELLANEOUS) IMPLANT
GAUZE XEROFORM 1X8 LF (GAUZE/BANDAGES/DRESSINGS) ×1 IMPLANT
GLOVE SURG SYN 7.5 PF PI (GLOVE) ×2 IMPLANT
GOWN STRL REUS W/ TWL LRG LVL3 (GOWN DISPOSABLE) ×1 IMPLANT
GOWN STRL REUS W/TWL XL LVL3 (GOWN DISPOSABLE) ×2 IMPLANT
KWIRE PROS 0.6X70 (WIRE) IMPLANT
NDL HYPO 25X1 1.5 SAFETY (NEEDLE) IMPLANT
NEEDLE HYPO 25X1 1.5 SAFETY (NEEDLE) IMPLANT
NS IRRIG 1000ML POUR BTL (IV SOLUTION) ×1 IMPLANT
PACK BASIN DAY SURGERY FS (CUSTOM PROCEDURE TRAY) ×1 IMPLANT
PAD CAST 3X4 CTTN HI CHSV (CAST SUPPLIES) ×1 IMPLANT
PAD CAST 4YDX4 CTTN HI CHSV (CAST SUPPLIES) IMPLANT
PADDING CAST ABS COTTON 3X4 (CAST SUPPLIES) IMPLANT
PADDING CAST ABS COTTON 4X4 ST (CAST SUPPLIES) ×1 IMPLANT
PLATE T 1.5 3/7H SHAFT (Plate) IMPLANT
SCREW COMP THRD MFS 1.5X13 (Screw) IMPLANT
SCREW LCP STARDRIVE ST 1.5X12 (Screw) IMPLANT
SCREW LOCK 14X1.5 VA (Screw) IMPLANT
SCREW LOCK COMPR 6X1.5 CORTEX (Screw) IMPLANT
SCREW LOCK COMPR 7X1.5 CORTEX (Screw) IMPLANT
SHEET MEDIUM DRAPE 40X70 STRL (DRAPES) ×1 IMPLANT
SLEEVE SCD COMPRESS KNEE MED (STOCKING) ×1 IMPLANT
SLING ARM FOAM STRAP XLG (SOFTGOODS) IMPLANT
SPIKE FLUID TRANSFER (MISCELLANEOUS) IMPLANT
SPLINT PLASTER CAST XFAST 3X15 (CAST SUPPLIES) IMPLANT
SPLINT PLASTER CAST XFAST 4X15 (CAST SUPPLIES) IMPLANT
STOCKINETTE 4X48 STRL (DRAPES) ×1 IMPLANT
SUT ETHILON 3 0 PS 1 (SUTURE) IMPLANT
SUT ETHILON 4 0 PS 2 18 (SUTURE) IMPLANT
SUT ETHILON 5 0 PS 2 18 (SUTURE) IMPLANT
SUT MERSILENE 4 0 P 3 (SUTURE) IMPLANT
SUT MNCRL AB 3-0 PS2 27 (SUTURE) IMPLANT
SUT PROLENE 5 0 P 3 (SUTURE) IMPLANT
SUT VIC AB 4-0 P-3 18XBRD (SUTURE) IMPLANT
SUT VIC AB 4-0 PS2 18 (SUTURE) IMPLANT
SYR BULB EAR ULCER 3OZ GRN STR (SYRINGE) ×1 IMPLANT
SYR CONTROL 10ML LL (SYRINGE) IMPLANT
TOWEL GREEN STERILE FF (TOWEL DISPOSABLE) ×2 IMPLANT
UNDERPAD 30X36 HEAVY ABSORB (UNDERPADS AND DIAPERS) ×1 IMPLANT

## 2024-08-21 NOTE — Op Note (Signed)
 I assisted Surgeons and Role:    * Chiaramonti, Marsa HERO, MD - Primary    DEWAINE Murrell Kuba, MD - Assisting on the Procedure(s): OSTEOTOMY, METACARPAL BONE OPEN REDUCTION INTERNAL FIXATION (ORIF) FINGER WITH RADIAL BONE GRAFT on 08/21/2024.  I provided assistance on this case as follows: Set up, approach, notification of the malunion, osteotomy of the malunion of the proximal phalanx, harvesting of the bone graft of the distal radius, correction of the malunion, placement of the bone graft, fixation of the osteotomy, the wound application the dressing and splint.  Electronically signed by: Kuba Murrell, MD Date: 08/21/2024 Time: 12:31 PM

## 2024-08-21 NOTE — Progress Notes (Signed)
 Assisted Dr. Jefm with left, infraclavicular, ultrasound guided block. Side rails up, monitors on throughout procedure. See vital signs in flow sheet. Tolerated Procedure well.

## 2024-08-21 NOTE — Discharge Instructions (Addendum)
 [facility] Post-Operative Instructions    General -These instructions are to compliment information given to you by your surgeon. -You may resume your normal diet as tolerated.  -You may resume your normal medications unless specifically instructed to stop taking a certain medication. -If you are not sure about restarting one of your medications after surgery, please contact the office during normal business hours and we will be able to assist you.  You have been given a prescription for Narcan  (naloxone ) nasal spray.  It is a rescue spray that rapidly reverses oxycodone  and other opioid pain medications in case of emergencies such as stopping breathing.   Post-Operative Dressings Splint: If you have a splint on your operative arm, it should stay on with all the dressings over it until you return for follow-up. It is ok to take a shower while you are wearing your splint, as long as you do not get it wet. The splint must be covered and kept clean and dry. If your splint gets wet, please call the clinic as you may need to come to clinic early for this to be changed.   If you have questions about your dressings, please call the clinic.   Post-operative Wound Care In general:  Any sutures or anything that will not fall off over time on their own, will be removed in clinic by our staff.  -If your surgical wound/incision was closed with non-absorbable sutures or staples, they will be removed at your first post-op visit.   -If your incision was closed with absorbable sutures, they do not need to be removed as they will dissolve on their own. They may be visible or buried underneath the skin.    You may have one of the following over your incision/wound.  They can all get wet and should remain in place until they fall off on their own. -Small stickers called steri-strips.  You do not need to remove them. If the ends start to peel up, you can  carefully trim them with scissors. -Skin glue often known by the brand name Dermabond.  Do not remove, it will fall off over time.  Certain procedures such as fracture fixation may require the use of pins that stick out through the skin.  If you have visible pins, please be careful to not bump them or move them.    Regardless of any sutures, stickers or glue used to close your wound, do not submerge the wound in any standing water for at least 4 weeks after surgery.  It is okay to let the shower water run over the wound and gently clean the wound with soapy water then rinse and gently pat dry.   Weightbearing/Activity  Do not use your operative extremity for any lifting or weight bearing  Please start finger range of motion exercises right away.  If you have a splint in place, you should move all joints that are not immobilized by a splint.  If you received a nerve block, you will need to remain in the sling until the nerve block wears off.  Pain Control - After your surgery, post-surgical discomfort or pain is normal. This discomfort can last several days to a few weeks. At certain times of the day (usually evenings/nights) your discomfort may be more intense.  - Do not drive while taking narcotic pain medications.   Nerve Block:  If you received a nerve block, it may provide pain relief for one hour  to up to two days after your surgery. As long as the nerve block is working, you will experience little or no sensation in the area the surgeon operated on.  As the nerve block wears off, you will begin to experience pain or discomfort. It is very important that you begin taking your prescribed pain medication before the nerve block fully wears off.  Treating your pain at the first sign of the block wearing off will ensure your pain is better controlled and more tolerable when full-sensation returns. Do not wait until the pain is intolerable, as the medicine will be less effective. It is better to  treat pain in advance than to try and catch up.  If the nerve block made your entire arm numb, you will be given an arm sling that you should wear until the block wears off, but no longer than 2 days unless otherwise instructed.   Pain Medication:  Typically, post operative pain can be managed by alternating tylenol  and an anti inflammatory medication.   We may also prescribe an opioid pain medication such as Oxycodone , Percocet (oxycodone  with Tylenol ) or Norco (hydrocodone  with Tylenol ) for post-operative pain. Some of these medications contain Tylenol  (acetaminophen ) in them.  It takes between 30 and 45 minutes before pain medication starts to work. It is important to take your medication before your pain level gets too intense.   Nausea is a common side effect of many pain medications. You will want to eat something before taking your pain medicine to help prevent nausea.   If you are taking a prescription opioid pain medication that contains acetaminophen  (Tylenol ), we recommend that you do not take additional over the counter acetaminophen  (Tylenol ).   If you are prescribed oxycodone  WITHOUT acetaminophen  (Tylenol ) in it and you do not have a known allergy , we recommend you take over-the-counter acetaminophen  (Tylenol ) with the oxycodone .  Take over-the-counter stool softener such as Colace or Sennakot while taking narcotic pain medications to help prevent constipation.    Other pain relieving options:  Elevation: Elevating your operative extremity can be very helpful to reduce this pain. Prop your arm up on pillows to keep the operative site above the level of your heart. If you develop tingling from prolonged elevation, take a break from elevating and this should resolve.  Icing: If you do not have a splint/cast, using a cold pack to ice the affected area a few times a day (15 to 20 minutes at a time) can also help to relieve pain, reduce swelling and bruising.   If you can take  nonsteroidal anti-inflammatory medications (NSAIDs, for example: Ibuprofen , Advil , Motrin , Aleve, etc.), you may take them to help control your pain. If you are unsure whether you can take anti-inflammatory medications, please check with your primary care provider.  If you are already taking a prescription of anti-inflammatory medications such as Celebrex  (celecoxib ) or Mobic  (meloxicam ) you should not take any additional anti-inflammatory drugs such as Ibuprofen , Advil , Motrin , or Aleve.    Follow Up Please call [facility] at [phone] if you do not receive or are unsure of your first follow-up appointment.  You should see your surgeon or PA 10-16 days after your surgery unless otherwise instructed.    Please call the office for any problems, including the following:  - Excessive redness of the incisions - Drainage for more than 4 days - Fever of more than 101.5 F - Nausea/vomiting that does not stop  - Numbness, tingling, or  discoloration of extremity  - Unable to drink fluids  - Uncontrollable pain     Marsa Christen, MD Hand and Upper Extremity Surgery [facility] [phone]     Post Anesthesia Home Care Instructions  Activity: Get plenty of rest for the remainder of the day. A responsible individual must stay with you for 24 hours following the procedure.  For the next 24 hours, DO NOT: -Drive a car -Advertising copywriter -Drink alcoholic beverages -Take any medication unless instructed by your physician -Make any legal decisions or sign important papers.  Meals: Start with liquid foods such as gelatin or soup. Progress to regular foods as tolerated. Avoid greasy, spicy, heavy foods. If nausea and/or vomiting occur, drink only clear liquids until the nausea and/or vomiting subsides. Call your physician if vomiting continues.  Special Instructions/Symptoms: Your throat may feel dry or sore from the anesthesia or the  breathing tube placed in your throat during surgery. If this causes discomfort, gargle with warm salt water. The discomfort should disappear within 24 hours.  If you had a scopolamine  patch placed behind your ear for the management of post- operative nausea and/or vomiting:  1. The medication in the patch is effective for 72 hours, after which it should be removed.  Wrap patch in a tissue and discard in the trash. Wash hands thoroughly with soap and water. 2. You may remove the patch earlier than 72 hours if you experience unpleasant side effects which may include dry mouth, dizziness or visual disturbances. 3. Avoid touching the patch. Wash your hands with soap and water after contact with the patch.      Regional Anesthesia Blocks  1. You may not be able to move or feel the blocked extremity after a regional anesthetic block. This may last may last from 3-48 hours after placement, but it will go away. The length of time depends on the medication injected and your individual response to the medication. As the nerves start to wake up, you may experience tingling as the movement and feeling returns to your extremity. If the numbness and inability to move your extremity has not gone away after 48 hours, please call your surgeon.   2. The extremity that is blocked will need to be protected until the numbness is gone and the strength has returned. Because you cannot feel it, you will need to take extra care to avoid injury. Because it may be weak, you may have difficulty moving it or using it. You may not know what position it is in without looking at it while the block is in effect.  3. For blocks in the legs and feet, returning to weight bearing and walking needs to be done carefully. You will need to wait until the numbness is entirely gone and the strength has returned. You should be able to move your leg and foot normally before you try and bear weight or walk. You will need someone to be with you  when you first try to ensure you do not fall and possibly risk injury.  4. Bruising and tenderness at the needle site are common side effects and will resolve in a few days.  5. Persistent numbness or new problems with movement should be communicated to the surgeon or the [facility] [phone] [facility] [phone]).

## 2024-08-21 NOTE — Anesthesia Postprocedure Evaluation (Signed)
 Anesthesia Post Note  Patient: Sharon Gregory  Procedure(s) Performed: OSTEOTOMY, METACARPAL BONE (Left: [place]) OPEN REDUCTION INTERNAL FIXATION (ORIF) FINGER WITH RADIAL BONE GRAFT (Left: [place])     Patient location during evaluation: PACU Anesthesia Type: Regional and MAC Level of consciousness: awake and alert Pain management: pain level controlled Vital Signs Assessment: post-procedure vital signs reviewed and stable Respiratory status: spontaneous breathing, nonlabored ventilation, respiratory function stable and patient connected to nasal cannula oxygen Cardiovascular status: stable and blood pressure returned to baseline Postop Assessment: no apparent nausea or vomiting Anesthetic complications: no   No notable events documented.  Last Vitals:  Vitals:   08/21/24 1308 08/21/24 1319  BP:  (!) 144/82  Pulse: (!) 101 (!) 101  Resp: 20 18  Temp:  (!) 36.4 C  SpO2: 96% 100%    Last Pain:  Vitals:   08/21/24 1319  TempSrc:   PainSc: 0-No pain                 Garnette DELENA Gab

## 2024-08-21 NOTE — Op Note (Signed)
 NAME: Sharon Gregory MEDICAL RECORD NO: [id number] DATE OF BIRTH: [birth year] FACILITY: [facility] LOCATION: [facility] PHYSICIAN: MARSA EMERSON CHRISTEN MD   OPERATIVE REPORT   DATE OF PROCEDURE: 08/21/24    PREOPERATIVE DIAGNOSIS:  left small finger malunion   POSTOPERATIVE DIAGNOSIS:  same   PROCEDURE:  left small finger proximal phalanx osteotomy with ORIF with autograft   SURGEON: MARSA HERO. Ara Mano, M.D.   ASSISTANT: Arley Curia, MD   ANESTHESIA:  Regional with sedation   INTRAVENOUS FLUIDS:  Per anesthesia flow sheet.   ESTIMATED BLOOD LOSS:  Minimal.   COMPLICATIONS:  None.   SPECIMENS:  none   TOURNIQUET TIME:    Total Tourniquet Time Documented: Upper Arm (Left) - 104 minutes Total: Upper Arm (Left) - 104 minutes    DISPOSITION:  Stable to PACU.   INDICATIONS:  20 female with left small finger proximal phalanx malunion that has been painful and preventing full extension of the PIP joint.  OPERATIVE COURSE:   Patient was identified in the preoperative care holding area and the procedure, risks, benefits and alternatives were reviewed and discussed with the patient. Informed consent for surgical procedure was reviewed.   Surgical site was confirmed and marked.  Anesthesia placed a regional nerve block in holding.  The patient was brought to the operating room and placed in the supine position with the operative extremity on a radiolucent for fluoroscopic hand table. Pressure points were padded.  A well-padded arm tourniquet was placed.  Sedation was performed by the anesthesia team.  The operative extremity was prepped with betadine scrub and paint and draped in typical sterile fashion.   The operative extremity was exsanguinated and the tourniquet inflated to 250 mmHg.  A dorsal midline incision was made over the proximal phalanx of the left small finger.  The soft tissues were bluntly dissected down to the extensor tendon.  A midline  incision was made through the extensor tendon which was then retracted.  The periosteum was incised and elevated.  The location of the malunion was identified on fluoroscopy and a 0.67mm K wire was placed transversely to mark the correct location.  Next, an oscillating saw was used to partially cut the dorsal cortical bone approximately 3/4 across the bone.  Then an osteotome was used to wedge open the dorsal cortex.  This was checked under fluoro to insure adequate correction of the malunion and was measured.  Next, attention was turned to obtaining cortical bone graft.  A dorsal midline incision was made over the distal radius.  The soft tissues were bluntly dissected down to Lister's tubercle.  An incision was  made through the retinaculum directly down to Lister's.  Next a wedge of cortical bone was obtained using osteotomes at the same length as the previously measured defect.  Additional cancellous bone graft was also obtained.    The osteotomy site was opened by applying a bending moment.  Once satisfactory reduction, the cortical bone fragment was placed to confirm size and fit.    A Synthes 1.11mm plate was selected and cut to size then checked under fluoroscopy.  The plate was pinned in place to ensure appropriate fit as it was challenging to hold the reduction, graft, and plate all in place.  The plate was then applied to the distal fragment. The cancellous and cortical bone grafts were put in place and the plate was then applied to the proximal fragment.  Appropriate length, alignment, and rotation were confirmed both clinically and  fluoroscopically.  The wounds were thoroughly irrigated with normal saline.  The tourniquet was released.  Hemostasis was achieved with bipolar electrocautery.  The extensor tendon was repaired with 4-0 mercelene suture. The dorsal small finger skin was closed with 5-0 nylon sutures.  The donor site was closed with 4-0 nylons.  The incisions were then covered with  Xeroform.  The hand was then bandaged and gauze and Webril and a splint was placed.   POST-OP PLAN: The patient will remain NWB in the splint until follow-up.  At the first follow-up appointment in approximately 2 weeks, we will evaluate for suture removal and send to hand therapy to make a splint.     MARSA CHRISTELLA CHRISTEN, MD Electronically signed, 08/21/24

## 2024-08-21 NOTE — Anesthesia Procedure Notes (Signed)
 Anesthesia Regional Block: Supraclavicular block   Pre-Anesthetic Checklist: , timeout performed,  Correct Patient, Correct Site, Correct Laterality,  Correct Procedure, Correct Position, site marked,  Risks and benefits discussed,  Surgical consent,  Pre-op evaluation,  At surgeon's request and post-op pain management  Laterality: Upper and Left  Prep: chloraprep       Needles:  Injection technique: Single-shot  Needle Type: Echogenic Needle     Needle Length: 5cm  Needle Gauge: 21     Additional Needles:   Procedures:,,,, ultrasound used (permanent image in chart),,     Nerve Stimulator or Paresthesia:   Additional Responses:  Block tested.  Patient tolerated procedure well Narrative:  Start time: 08/21/2024 9:47 AM End time: 08/21/2024 9:55 AM Injection made incrementally with aspirations every 5 mL.  Performed by: Personally  Anesthesiologist: Jefm Garnette LABOR, MD  Additional Notes: Block tested. Patient tolerated procedure well.
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
## 2024-08-21 NOTE — Transfer of Care (Signed)
 Immediate Anesthesia Transfer of Care Note  Patient: Sharon Gregory  Procedure(s) Performed: Procedure(s) (LRB): OSTEOTOMY, METACARPAL BONE (Left) OPEN REDUCTION INTERNAL FIXATION (ORIF) FINGER WITH RADIAL BONE GRAFT (Left)  Patient Location: PACU  Anesthesia Type: MAC  Level of Consciousness: awake, alert , oriented and patient cooperative  Airway & Oxygen Therapy: Patient Spontanous Breathing and Patient connected to face mask oxygen  Post-op Assessment: Report given to PACU RN and Post -op Vital signs reviewed and stable  Post vital signs: Reviewed and stable  Complications: No apparent anesthesia complications Last Vitals:  Vitals Value Taken Time  BP 138/70 08/21/24 12:38  Temp    Pulse 105 08/21/24 12:38  Resp 29 08/21/24 12:38  SpO2 96 % 08/21/24 12:38  Vitals shown include unfiled device data.  Last Pain:  Vitals:   08/21/24 0859  TempSrc: Temporal  PainSc: 7       Patients Stated Pain Goal: 3 (08/21/24 0859)  Complications: No notable events documented.

## 2024-08-21 NOTE — Brief Op Note (Signed)
 08/21/2024  12:32 PM  PATIENT:  Sharon Gregory  43 y.o. female  PRE-OPERATIVE DIAGNOSIS:  LEFT SMALL FINGER PROXIMAL PHALANX MALUNION  POST-OPERATIVE DIAGNOSIS:  LEFT SMALL FINGER PROXIMAL PHALANX MALUNION  PROCEDURE:  Procedure(s) with comments: OSTEOTOMY, METACARPAL BONE (Left) - LEFT SMALL FINGER PROXIMAL PHALANX OSTEOTOMY WITH AUTOGRAFT OR ALLOGRAFT OPEN REDUCTION INTERNAL FIXATION (ORIF) FINGER WITH RADIAL BONE GRAFT (Left)  SURGEON:  Surgeons and Role:    * Rossana Molchan, Marsa HERO, MD - Primary    * Murrell Kuba, MD - Assisting  ANESTHESIA:   regional and MAC  EBL:  3 mL   BLOOD ADMINISTERED:none  DRAINS: none   LOCAL MEDICATIONS USED:  NONE  SPECIMEN:  No Specimen  DISPOSITION OF SPECIMEN:  N/A  COUNTS:  YES  TOURNIQUET:   Total Tourniquet Time Documented: Upper Arm (Left) - 104 minutes Total: Upper Arm (Left) - 104 minutes   DICTATION: .Note written in EPIC  PLAN OF CARE: Discharge to home after PACU  PATIENT DISPOSITION:  PACU - hemodynamically stable.   Delay start of Pharmacological VTE agent (>24hrs) due to surgical blood loss or risk of bleeding: no

## 2024-08-21 NOTE — Anesthesia Procedure Notes (Signed)
 Procedure Name: MAC Date/Time: 08/21/2024 10:25 AM  Performed by: Delayne Olam BIRCH, CRNAPre-anesthesia Checklist: Patient identified, Emergency Drugs available, Suction available, Patient being monitored and Timeout performed Oxygen Delivery Method: Simple face mask

## 2024-08-24 ENCOUNTER — Other Ambulatory Visit (HOSPITAL_COMMUNITY): Payer: Self-pay

## 2024-08-25 ENCOUNTER — Other Ambulatory Visit (HOSPITAL_COMMUNITY): Payer: Self-pay

## 2024-08-25 MED ORDER — ESOMEPRAZOLE MAGNESIUM 40 MG PO CPDR
40.0000 mg | DELAYED_RELEASE_CAPSULE | Freq: Every day | ORAL | 0 refills | Status: DC
  Filled 2024-08-25: qty 90, 90d supply, fill #0

## 2024-08-26 ENCOUNTER — Encounter (HOSPITAL_BASED_OUTPATIENT_CLINIC_OR_DEPARTMENT_OTHER): Payer: Self-pay | Admitting: Orthopedic Surgery

## 2024-08-26 ENCOUNTER — Other Ambulatory Visit: Payer: Self-pay

## 2024-08-27 DIAGNOSIS — S62617A Displaced fracture of proximal phalanx of left little finger, initial encounter for closed fracture: Secondary | ICD-10-CM | POA: Diagnosis not present

## 2024-08-27 DIAGNOSIS — M79645 Pain in left finger(s): Secondary | ICD-10-CM | POA: Diagnosis not present

## 2024-08-27 DIAGNOSIS — M25642 Stiffness of left hand, not elsewhere classified: Secondary | ICD-10-CM | POA: Diagnosis not present

## 2024-09-03 ENCOUNTER — Other Ambulatory Visit (HOSPITAL_COMMUNITY): Payer: Self-pay

## 2024-09-04 ENCOUNTER — Other Ambulatory Visit (HOSPITAL_COMMUNITY): Payer: Self-pay

## 2024-09-04 MED ORDER — GABAPENTIN 600 MG PO TABS
600.0000 mg | ORAL_TABLET | Freq: Three times a day (TID) | ORAL | 2 refills | Status: AC
  Filled 2024-09-04 – 2024-09-23 (×2): qty 270, 90d supply, fill #0
  Filled 2024-12-22: qty 270, 90d supply, fill #1

## 2024-09-09 DIAGNOSIS — M25642 Stiffness of left hand, not elsewhere classified: Secondary | ICD-10-CM | POA: Diagnosis not present

## 2024-09-09 DIAGNOSIS — M79645 Pain in left finger(s): Secondary | ICD-10-CM | POA: Diagnosis not present

## 2024-09-13 ENCOUNTER — Other Ambulatory Visit (HOSPITAL_COMMUNITY): Payer: Self-pay

## 2024-09-16 DIAGNOSIS — M25642 Stiffness of left hand, not elsewhere classified: Secondary | ICD-10-CM | POA: Diagnosis not present

## 2024-09-16 DIAGNOSIS — M79645 Pain in left finger(s): Secondary | ICD-10-CM | POA: Diagnosis not present

## 2024-09-20 ENCOUNTER — Other Ambulatory Visit: Payer: Self-pay | Admitting: Adult Health

## 2024-09-20 ENCOUNTER — Other Ambulatory Visit (HOSPITAL_COMMUNITY): Payer: Self-pay

## 2024-09-20 DIAGNOSIS — F319 Bipolar disorder, unspecified: Secondary | ICD-10-CM

## 2024-09-20 MED ORDER — OLANZAPINE 10 MG PO TABS
10.0000 mg | ORAL_TABLET | Freq: Every day | ORAL | 0 refills | Status: DC
  Filled 2024-09-20: qty 30, 30d supply, fill #0

## 2024-09-21 ENCOUNTER — Other Ambulatory Visit: Payer: Self-pay

## 2024-09-21 ENCOUNTER — Other Ambulatory Visit (HOSPITAL_COMMUNITY): Payer: Self-pay

## 2024-09-21 MED ORDER — FLUTICASONE PROPIONATE 50 MCG/ACT NA SUSP
2.0000 | Freq: Every day | NASAL | 0 refills | Status: DC
  Filled 2024-09-21: qty 16, 30d supply, fill #0

## 2024-09-22 ENCOUNTER — Other Ambulatory Visit (HOSPITAL_COMMUNITY): Payer: Self-pay

## 2024-09-23 ENCOUNTER — Other Ambulatory Visit: Payer: Self-pay

## 2024-09-23 ENCOUNTER — Other Ambulatory Visit (HOSPITAL_COMMUNITY): Payer: Self-pay

## 2024-09-23 DIAGNOSIS — M79645 Pain in left finger(s): Secondary | ICD-10-CM | POA: Diagnosis not present

## 2024-09-23 DIAGNOSIS — M25642 Stiffness of left hand, not elsewhere classified: Secondary | ICD-10-CM | POA: Diagnosis not present

## 2024-09-24 ENCOUNTER — Other Ambulatory Visit: Payer: Self-pay

## 2024-09-28 DIAGNOSIS — S62617P Displaced fracture of proximal phalanx of left little finger, subsequent encounter for fracture with malunion: Secondary | ICD-10-CM | POA: Diagnosis not present

## 2024-09-28 DIAGNOSIS — S62617A Displaced fracture of proximal phalanx of left little finger, initial encounter for closed fracture: Secondary | ICD-10-CM | POA: Diagnosis not present

## 2024-09-29 ENCOUNTER — Other Ambulatory Visit: Payer: Self-pay | Admitting: Internal Medicine

## 2024-09-29 ENCOUNTER — Other Ambulatory Visit (HOSPITAL_COMMUNITY): Payer: Self-pay

## 2024-09-29 ENCOUNTER — Other Ambulatory Visit: Payer: Self-pay

## 2024-09-29 DIAGNOSIS — E785 Hyperlipidemia, unspecified: Secondary | ICD-10-CM

## 2024-09-29 MED ORDER — ROSUVASTATIN CALCIUM 40 MG PO TABS
40.0000 mg | ORAL_TABLET | Freq: Every day | ORAL | 1 refills | Status: DC
  Filled 2024-09-29: qty 90, 90d supply, fill #0

## 2024-09-30 ENCOUNTER — Encounter: Payer: Self-pay | Admitting: Adult Health

## 2024-09-30 ENCOUNTER — Other Ambulatory Visit (HOSPITAL_COMMUNITY): Payer: Self-pay

## 2024-09-30 ENCOUNTER — Telehealth: Admitting: Adult Health

## 2024-09-30 DIAGNOSIS — F4310 Post-traumatic stress disorder, unspecified: Secondary | ICD-10-CM | POA: Diagnosis not present

## 2024-09-30 DIAGNOSIS — F319 Bipolar disorder, unspecified: Secondary | ICD-10-CM

## 2024-09-30 DIAGNOSIS — F429 Obsessive-compulsive disorder, unspecified: Secondary | ICD-10-CM

## 2024-09-30 DIAGNOSIS — M79645 Pain in left finger(s): Secondary | ICD-10-CM | POA: Diagnosis not present

## 2024-09-30 DIAGNOSIS — G3184 Mild cognitive impairment, so stated: Secondary | ICD-10-CM | POA: Diagnosis not present

## 2024-09-30 DIAGNOSIS — M25642 Stiffness of left hand, not elsewhere classified: Secondary | ICD-10-CM | POA: Diagnosis not present

## 2024-09-30 MED ORDER — NOREL AD 4-10-325 MG PO TABS
1.0000 | ORAL_TABLET | Freq: Two times a day (BID) | ORAL | 2 refills | Status: AC | PRN
  Filled 2024-09-30 – 2024-10-01 (×3): qty 20, 10d supply, fill #0
  Filled 2024-12-11: qty 20, 10d supply, fill #1

## 2024-09-30 MED ORDER — LEVOCETIRIZINE DIHYDROCHLORIDE 5 MG PO TABS
5.0000 mg | ORAL_TABLET | Freq: Every day | ORAL | 2 refills | Status: AC
  Filled 2024-09-30: qty 90, 90d supply, fill #0
  Filled 2024-12-25: qty 90, 90d supply, fill #1

## 2024-09-30 MED ORDER — AZITHROMYCIN 250 MG PO TABS
ORAL_TABLET | ORAL | 0 refills | Status: AC
  Filled 2024-09-30: qty 6, 5d supply, fill #0

## 2024-09-30 MED ORDER — PROMETHAZINE HCL 25 MG PO TABS
25.0000 mg | ORAL_TABLET | Freq: Two times a day (BID) | ORAL | 2 refills | Status: AC | PRN
  Filled 2024-09-30: qty 180, 90d supply, fill #0

## 2024-09-30 MED ORDER — FLUTICASONE PROPIONATE 50 MCG/ACT NA SUSP
2.0000 | Freq: Every day | NASAL | 2 refills | Status: AC
  Filled 2024-09-30 – 2024-11-21 (×3): qty 16, 30d supply, fill #0

## 2024-09-30 MED ORDER — OLANZAPINE 10 MG PO TABS
10.0000 mg | ORAL_TABLET | Freq: Every day | ORAL | 2 refills | Status: DC
  Filled 2024-09-30 – 2024-10-22 (×2): qty 30, 30d supply, fill #0
  Filled 2024-11-21: qty 30, 30d supply, fill #1
  Filled 2024-12-18: qty 30, 30d supply, fill #2

## 2024-09-30 MED ORDER — MOUNJARO 12.5 MG/0.5ML ~~LOC~~ SOAJ
12.5000 mg | SUBCUTANEOUS | 2 refills | Status: DC
  Filled 2024-09-30: qty 4, 56d supply, fill #0
  Filled ????-??-??: fill #0
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
## 2024-10-01 ENCOUNTER — Other Ambulatory Visit (HOSPITAL_COMMUNITY): Payer: Self-pay

## 2024-10-01 ENCOUNTER — Other Ambulatory Visit: Payer: Self-pay

## 2024-10-02 ENCOUNTER — Other Ambulatory Visit

## 2024-10-02 ENCOUNTER — Ambulatory Visit: Admitting: Internal Medicine

## 2024-10-02 ENCOUNTER — Other Ambulatory Visit (HOSPITAL_COMMUNITY): Payer: Self-pay

## 2024-10-02 ENCOUNTER — Encounter: Payer: Self-pay | Admitting: Internal Medicine

## 2024-10-02 VITALS — BP 134/82 | HR 94 | Ht 64.75 in | Wt 273.0 lb

## 2024-10-02 DIAGNOSIS — E785 Hyperlipidemia, unspecified: Secondary | ICD-10-CM | POA: Diagnosis not present

## 2024-10-02 DIAGNOSIS — E1165 Type 2 diabetes mellitus with hyperglycemia: Secondary | ICD-10-CM | POA: Diagnosis not present

## 2024-10-02 DIAGNOSIS — E1142 Type 2 diabetes mellitus with diabetic polyneuropathy: Secondary | ICD-10-CM

## 2024-10-02 DIAGNOSIS — E1129 Type 2 diabetes mellitus with other diabetic kidney complication: Secondary | ICD-10-CM

## 2024-10-02 DIAGNOSIS — Z794 Long term (current) use of insulin: Secondary | ICD-10-CM | POA: Diagnosis not present

## 2024-10-02 DIAGNOSIS — R809 Proteinuria, unspecified: Secondary | ICD-10-CM | POA: Diagnosis not present

## 2024-10-02 LAB — POCT GLYCOSYLATED HEMOGLOBIN (HGB A1C): Hemoglobin A1C: 11.3 % — AB (ref 4.0–5.6)

## 2024-10-02 MED ORDER — TIRZEPATIDE 15 MG/0.5ML ~~LOC~~ SOAJ
15.0000 mg | SUBCUTANEOUS | 3 refills | Status: AC
  Filled 2024-10-02: qty 6, 84d supply, fill #0
  Filled 2024-10-05: qty 2, 28d supply, fill #0
  Filled 2024-10-28: qty 2, 28d supply, fill #1
  Filled 2024-12-11: qty 2, 28d supply, fill #2
  Filled 2025-01-06: qty 2, 28d supply, fill #3

## 2024-10-02 MED ORDER — HUMALOG KWIKPEN 200 UNIT/ML ~~LOC~~ SOPN
70.0000 [IU] | PEN_INJECTOR | Freq: Every day | SUBCUTANEOUS | 3 refills | Status: AC
  Filled 2024-10-02: qty 30, fill #0
  Filled 2024-10-14: qty 30, 30d supply, fill #0
  Filled 2024-10-28: qty 30, 85d supply, fill #0

## 2024-10-02 MED ORDER — FREESTYLE LIBRE 3 PLUS SENSOR MISC
1.0000 | 3 refills | Status: AC
  Filled 2024-10-02 – 2024-11-03 (×2): qty 6, 84d supply, fill #0
  Filled 2024-12-11: qty 6, 90d supply, fill #0

## 2024-10-02 MED ORDER — ROSUVASTATIN CALCIUM 40 MG PO TABS
40.0000 mg | ORAL_TABLET | Freq: Every day | ORAL | 2 refills | Status: AC
  Filled 2024-10-02 – 2024-12-28 (×3): qty 90, 90d supply, fill #0

## 2024-10-02 MED ORDER — UNIFINE PENTIPS 32G X 6 MM MISC
1.0000 | Freq: Four times a day (QID) | 3 refills | Status: AC
  Filled 2024-10-02: qty 400, 90d supply, fill #0

## 2024-10-02 MED ORDER — METFORMIN HCL ER 500 MG PO TB24
500.0000 mg | ORAL_TABLET | Freq: Two times a day (BID) | ORAL | 2 refills | Status: AC
  Filled 2024-10-02 – 2024-10-28 (×2): qty 180, 90d supply, fill #0

## 2024-10-02 MED ORDER — TOUJEO MAX SOLOSTAR 300 UNIT/ML ~~LOC~~ SOPN
100.0000 [IU] | PEN_INJECTOR | Freq: Every day | SUBCUTANEOUS | 3 refills | Status: AC
  Filled 2024-10-02 – 2024-10-28 (×3): qty 15, 45d supply, fill #0

## 2024-10-02 NOTE — Patient Instructions (Addendum)
-   Increase  Mounjaro  15 mg weekly  - Continue Metformin  500 mg XR twice daily  - Change  Toujeo  100 units daily  - Take  Humalog  12 units with each meal  - Take Humalog  4 units with  -Humalog  correctional insulin : ADD extra units on insulin  to your meal-time Humalog  dose if your blood sugars are higher than 155. Use the scale below to help guide you:   Blood sugar before meal Number of units to inject  Less than 155 0 unit  156 -  180 1 units  181 -  205 2 units  206 -  230 3 units  231 -  255 4 units  256 -  280 5 units  281 -  305 6 units  306 -  330 7 units  331 -  355 8 units  356 - 380 9 units    HOW TO TREAT LOW BLOOD SUGARS (Blood sugar LESS THAN 70 MG/DL) Please follow the RULE OF 15 for the treatment of hypoglycemia treatment (when your (blood sugars are less than 70 mg/dL)   STEP 1: Take 15 grams of carbohydrates when your blood sugar is low, which includes:  3-4 GLUCOSE TABS  OR 3-4 OZ OF JUICE OR REGULAR SODA OR ONE TUBE OF GLUCOSE GEL    STEP 2: RECHECK blood sugar in 15 MINUTES STEP 3: If your blood sugar is still low at the 15 minute recheck --> then, go back to STEP 1 and treat AGAIN with another 15 grams of carbohydrates.
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
## 2024-10-03 LAB — MICROALBUMIN / CREATININE URINE RATIO
Creatinine, Urine: 114 mg/dL (ref 20–275)
Microalb Creat Ratio: 251 mg/g{creat} — ABNORMAL HIGH (ref <30)
Microalb, Ur: 28.6 mg/dL

## 2024-10-03 LAB — LIPID PANEL
Cholesterol: 149 mg/dL (ref <200)
HDL: 40 mg/dL — ABNORMAL LOW (ref >=50)
LDL Cholesterol (Calc): 84 mg/dL
Non-HDL Cholesterol (Calc): 109 mg/dL (ref <130)
Total CHOL/HDL Ratio: 3.7 (calc) (ref <5.0)
Triglycerides: 151 mg/dL — ABNORMAL HIGH (ref <150)

## 2024-10-05 ENCOUNTER — Other Ambulatory Visit: Payer: Self-pay

## 2024-10-05 ENCOUNTER — Ambulatory Visit: Payer: Self-pay | Admitting: Internal Medicine

## 2024-10-05 ENCOUNTER — Other Ambulatory Visit (HOSPITAL_COMMUNITY): Payer: Self-pay

## 2024-10-05 MED ORDER — LOSARTAN POTASSIUM 50 MG PO TABS
50.0000 mg | ORAL_TABLET | Freq: Every day | ORAL | 3 refills | Status: AC
  Filled 2024-10-05: qty 90, 90d supply, fill #0
  Filled 2024-12-30: qty 90, 90d supply, fill #1

## 2024-10-07 DIAGNOSIS — M25642 Stiffness of left hand, not elsewhere classified: Secondary | ICD-10-CM | POA: Diagnosis not present

## 2024-10-07 DIAGNOSIS — M79645 Pain in left finger(s): Secondary | ICD-10-CM | POA: Diagnosis not present

## 2024-10-08 ENCOUNTER — Other Ambulatory Visit (HOSPITAL_COMMUNITY): Payer: Self-pay

## 2024-10-14 ENCOUNTER — Other Ambulatory Visit (HOSPITAL_COMMUNITY): Payer: Self-pay

## 2024-10-14 ENCOUNTER — Encounter: Payer: Self-pay | Admitting: Pharmacist

## 2024-10-14 ENCOUNTER — Other Ambulatory Visit: Payer: Self-pay

## 2024-10-14 DIAGNOSIS — T753XXA Motion sickness, initial encounter: Secondary | ICD-10-CM | POA: Diagnosis not present

## 2024-10-14 DIAGNOSIS — H1033 Unspecified acute conjunctivitis, bilateral: Secondary | ICD-10-CM | POA: Diagnosis not present

## 2024-10-14 DIAGNOSIS — L663 Perifolliculitis capitis abscedens: Secondary | ICD-10-CM | POA: Diagnosis not present

## 2024-10-14 DIAGNOSIS — E1142 Type 2 diabetes mellitus with diabetic polyneuropathy: Secondary | ICD-10-CM | POA: Diagnosis not present

## 2024-10-14 MED ORDER — SCOPOLAMINE 1 MG/3DAYS TD PT72
1.0000 | MEDICATED_PATCH | TRANSDERMAL | 2 refills | Status: AC | PRN
  Filled 2024-10-14 (×2): qty 4, 12d supply, fill #0

## 2024-10-14 MED ORDER — FLUCONAZOLE 150 MG PO TABS
150.0000 mg | ORAL_TABLET | ORAL | 0 refills | Status: AC
  Filled 2024-10-14 (×2): qty 2, 14d supply, fill #0

## 2024-10-14 MED ORDER — DOXYCYCLINE HYCLATE 100 MG PO TABS
100.0000 mg | ORAL_TABLET | Freq: Two times a day (BID) | ORAL | 0 refills | Status: AC
  Filled 2024-10-14 (×2): qty 20, 10d supply, fill #0

## 2024-10-14 MED ORDER — TOBRAMYCIN-DEXAMETHASONE 0.3-0.1 % OP SUSP
2.0000 [drp] | Freq: Four times a day (QID) | OPHTHALMIC | 2 refills | Status: AC | PRN
Start: 2024-10-14 — End: ?
  Filled 2024-10-14 (×2): qty 5, 13d supply, fill #0

## 2024-10-15 ENCOUNTER — Other Ambulatory Visit (HOSPITAL_COMMUNITY): Payer: Self-pay

## 2024-10-22 ENCOUNTER — Other Ambulatory Visit: Payer: Self-pay

## 2024-10-27 DIAGNOSIS — R29898 Other symptoms and signs involving the musculoskeletal system: Secondary | ICD-10-CM | POA: Diagnosis not present

## 2024-10-27 DIAGNOSIS — M79645 Pain in left finger(s): Secondary | ICD-10-CM | POA: Diagnosis not present

## 2024-10-27 DIAGNOSIS — M25642 Stiffness of left hand, not elsewhere classified: Secondary | ICD-10-CM | POA: Diagnosis not present

## 2024-10-28 ENCOUNTER — Encounter: Payer: Self-pay | Admitting: Pharmacist

## 2024-10-28 ENCOUNTER — Other Ambulatory Visit: Payer: Self-pay

## 2024-10-28 ENCOUNTER — Other Ambulatory Visit (HOSPITAL_COMMUNITY): Payer: Self-pay

## 2024-11-01 ENCOUNTER — Other Ambulatory Visit (HOSPITAL_COMMUNITY): Payer: Self-pay

## 2024-11-02 ENCOUNTER — Other Ambulatory Visit: Payer: Self-pay

## 2024-11-02 ENCOUNTER — Other Ambulatory Visit (HOSPITAL_COMMUNITY): Payer: Self-pay

## 2024-11-02 MED ORDER — ZOLPIDEM TARTRATE ER 12.5 MG PO TBCR
12.5000 mg | EXTENDED_RELEASE_TABLET | Freq: Every evening | ORAL | 2 refills | Status: AC | PRN
  Filled 2024-11-02: qty 90, 90d supply, fill #0

## 2024-11-03 ENCOUNTER — Other Ambulatory Visit (HOSPITAL_COMMUNITY): Payer: Self-pay

## 2024-11-03 DIAGNOSIS — M25642 Stiffness of left hand, not elsewhere classified: Secondary | ICD-10-CM | POA: Diagnosis not present

## 2024-11-03 DIAGNOSIS — M79645 Pain in left finger(s): Secondary | ICD-10-CM | POA: Diagnosis not present

## 2024-11-03 DIAGNOSIS — R29898 Other symptoms and signs involving the musculoskeletal system: Secondary | ICD-10-CM | POA: Diagnosis not present

## 2024-11-12 DIAGNOSIS — S62617P Displaced fracture of proximal phalanx of left little finger, subsequent encounter for fracture with malunion: Secondary | ICD-10-CM | POA: Diagnosis not present

## 2024-11-12 DIAGNOSIS — M65352 Trigger finger, left little finger: Secondary | ICD-10-CM | POA: Diagnosis not present

## 2024-11-21 ENCOUNTER — Other Ambulatory Visit (HOSPITAL_COMMUNITY): Payer: Self-pay

## 2024-11-22 ENCOUNTER — Other Ambulatory Visit (HOSPITAL_COMMUNITY): Payer: Self-pay

## 2024-11-23 ENCOUNTER — Other Ambulatory Visit: Payer: Self-pay

## 2024-11-23 ENCOUNTER — Other Ambulatory Visit (HOSPITAL_COMMUNITY): Payer: Self-pay

## 2024-11-23 MED ORDER — SERTRALINE HCL 100 MG PO TABS
100.0000 mg | ORAL_TABLET | Freq: Every evening | ORAL | 5 refills | Status: AC
  Filled 2024-11-23: qty 30, 30d supply, fill #0
  Filled 2024-12-20: qty 30, 30d supply, fill #1

## 2024-11-23 MED ORDER — ESOMEPRAZOLE MAGNESIUM 40 MG PO CPDR
40.0000 mg | DELAYED_RELEASE_CAPSULE | Freq: Every day | ORAL | 1 refills | Status: AC
  Filled 2024-11-23: qty 90, 90d supply, fill #0

## 2024-12-11 ENCOUNTER — Other Ambulatory Visit: Payer: Self-pay

## 2024-12-11 ENCOUNTER — Other Ambulatory Visit (HOSPITAL_COMMUNITY): Payer: Self-pay

## 2024-12-13 MED ORDER — GLUCOSE BLOOD VI STRP
ORAL_STRIP | 5 refills | Status: AC
  Filled 2024-12-13: qty 300, 90d supply, fill #0

## 2024-12-14 ENCOUNTER — Other Ambulatory Visit (HOSPITAL_COMMUNITY): Payer: Self-pay

## 2024-12-18 ENCOUNTER — Other Ambulatory Visit: Payer: Self-pay

## 2024-12-21 ENCOUNTER — Other Ambulatory Visit: Payer: Self-pay

## 2024-12-22 ENCOUNTER — Other Ambulatory Visit (HOSPITAL_COMMUNITY): Payer: Self-pay

## 2024-12-22 ENCOUNTER — Other Ambulatory Visit: Payer: Self-pay

## 2024-12-26 ENCOUNTER — Other Ambulatory Visit (HOSPITAL_COMMUNITY): Payer: Self-pay

## 2024-12-28 ENCOUNTER — Other Ambulatory Visit: Payer: Self-pay

## 2024-12-31 ENCOUNTER — Other Ambulatory Visit: Payer: Self-pay

## 2025-01-01 ENCOUNTER — Ambulatory Visit: Admitting: Internal Medicine

## 2025-01-06 ENCOUNTER — Other Ambulatory Visit: Payer: Self-pay

## 2025-01-06 ENCOUNTER — Other Ambulatory Visit (HOSPITAL_COMMUNITY): Payer: Self-pay

## 2025-01-06 ENCOUNTER — Telehealth: Admitting: Adult Health

## 2025-01-06 ENCOUNTER — Encounter: Payer: Self-pay | Admitting: Adult Health

## 2025-01-06 ENCOUNTER — Other Ambulatory Visit (HOSPITAL_BASED_OUTPATIENT_CLINIC_OR_DEPARTMENT_OTHER): Payer: Self-pay

## 2025-01-06 DIAGNOSIS — F429 Obsessive-compulsive disorder, unspecified: Secondary | ICD-10-CM | POA: Diagnosis not present

## 2025-01-06 DIAGNOSIS — G3184 Mild cognitive impairment, so stated: Secondary | ICD-10-CM | POA: Diagnosis not present

## 2025-01-06 DIAGNOSIS — F319 Bipolar disorder, unspecified: Secondary | ICD-10-CM | POA: Diagnosis not present

## 2025-01-06 DIAGNOSIS — F4310 Post-traumatic stress disorder, unspecified: Secondary | ICD-10-CM

## 2025-01-06 MED ORDER — OLANZAPINE 10 MG PO TABS
10.0000 mg | ORAL_TABLET | Freq: Every day | ORAL | 2 refills | Status: AC
  Filled 2025-01-06: qty 90, 90d supply, fill #0

## 2025-01-06 MED ORDER — DIVALPROEX SODIUM ER 250 MG PO TB24
250.0000 mg | ORAL_TABLET | Freq: Every day | ORAL | 2 refills | Status: AC
  Filled 2025-01-06: qty 90, 90d supply, fill #0
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
## 2025-01-13 ENCOUNTER — Other Ambulatory Visit: Payer: Self-pay

## 2025-01-14 ENCOUNTER — Other Ambulatory Visit (HOSPITAL_COMMUNITY): Payer: Self-pay

## 2025-03-05 ENCOUNTER — Ambulatory Visit: Admitting: Internal Medicine
